# Patient Record
Sex: Male | Born: 1935 | Race: White | Hispanic: No | Smoking: Former smoker
Health system: Southern US, Community
[De-identification: ages and names within clinical notes are randomized; demographics above are authoritative.]

## PROBLEM LIST (undated history)

## (undated) DIAGNOSIS — H9190 Unspecified hearing loss, unspecified ear: Secondary | ICD-10-CM

## (undated) DIAGNOSIS — Z87891 Personal history of nicotine dependence: Secondary | ICD-10-CM

## (undated) DIAGNOSIS — R0600 Dyspnea, unspecified: Secondary | ICD-10-CM

## (undated) DIAGNOSIS — M48062 Spinal stenosis, lumbar region with neurogenic claudication: Secondary | ICD-10-CM

## (undated) DIAGNOSIS — N400 Enlarged prostate without lower urinary tract symptoms: Secondary | ICD-10-CM

## (undated) DIAGNOSIS — R0609 Other forms of dyspnea: Secondary | ICD-10-CM

## (undated) DIAGNOSIS — R3911 Hesitancy of micturition: Secondary | ICD-10-CM

## (undated) DIAGNOSIS — I4891 Unspecified atrial fibrillation: Secondary | ICD-10-CM

## (undated) DIAGNOSIS — R0789 Other chest pain: Secondary | ICD-10-CM

## (undated) DIAGNOSIS — I10 Essential (primary) hypertension: Secondary | ICD-10-CM

## (undated) DIAGNOSIS — K279 Peptic ulcer, site unspecified, unspecified as acute or chronic, without hemorrhage or perforation: Secondary | ICD-10-CM

## (undated) HISTORY — DX: Other forms of dyspnea: R06.09

## (undated) HISTORY — PX: HERNIA REPAIR: SHX51

## (undated) HISTORY — DX: Personal history of nicotine dependence: Z87.891

## (undated) HISTORY — DX: Unspecified atrial fibrillation: I48.91

## (undated) HISTORY — DX: Essential (primary) hypertension: I10

## (undated) HISTORY — PX: TYMPANOMASTOIDECTOMY: SHX34

## (undated) HISTORY — DX: Dyspnea, unspecified: R06.00

## (undated) HISTORY — DX: Peptic ulcer, site unspecified, unspecified as acute or chronic, without hemorrhage or perforation: K27.9

## (undated) HISTORY — DX: Other chest pain: R07.89

## (undated) HISTORY — DX: Spinal stenosis, lumbar region with neurogenic claudication: M48.062

---

## 1998-03-01 ENCOUNTER — Ambulatory Visit (HOSPITAL_COMMUNITY): Admission: RE | Admit: 1998-03-01 | Discharge: 1998-03-01 | Payer: Self-pay | Admitting: Internal Medicine

## 1998-03-01 ENCOUNTER — Ambulatory Visit (HOSPITAL_COMMUNITY): Admission: RE | Admit: 1998-03-01 | Discharge: 1998-03-01 | Payer: Self-pay | Admitting: Cardiology

## 1999-05-01 ENCOUNTER — Encounter: Payer: Self-pay | Admitting: General Surgery

## 1999-05-02 ENCOUNTER — Ambulatory Visit (HOSPITAL_COMMUNITY): Admission: RE | Admit: 1999-05-02 | Discharge: 1999-05-02 | Payer: Self-pay | Admitting: General Surgery

## 1999-06-25 ENCOUNTER — Ambulatory Visit (HOSPITAL_BASED_OUTPATIENT_CLINIC_OR_DEPARTMENT_OTHER): Admission: RE | Admit: 1999-06-25 | Discharge: 1999-06-25 | Payer: Self-pay | Admitting: *Deleted

## 2001-04-05 ENCOUNTER — Inpatient Hospital Stay (HOSPITAL_COMMUNITY): Admission: EM | Admit: 2001-04-05 | Discharge: 2001-04-06 | Payer: Self-pay | Admitting: Emergency Medicine

## 2001-04-05 ENCOUNTER — Encounter: Payer: Self-pay | Admitting: Emergency Medicine

## 2003-06-05 ENCOUNTER — Ambulatory Visit (HOSPITAL_COMMUNITY): Admission: RE | Admit: 2003-06-05 | Discharge: 2003-06-05 | Payer: Self-pay | Admitting: Orthopaedic Surgery

## 2004-06-28 ENCOUNTER — Emergency Department (HOSPITAL_COMMUNITY): Admission: EM | Admit: 2004-06-28 | Discharge: 2004-06-28 | Payer: Self-pay | Admitting: Emergency Medicine

## 2005-04-02 ENCOUNTER — Emergency Department (HOSPITAL_COMMUNITY): Admission: EM | Admit: 2005-04-02 | Discharge: 2005-04-02 | Payer: Self-pay | Admitting: Emergency Medicine

## 2005-04-02 ENCOUNTER — Ambulatory Visit: Payer: Self-pay | Admitting: Cardiology

## 2005-04-03 ENCOUNTER — Ambulatory Visit: Payer: Self-pay

## 2005-04-14 ENCOUNTER — Ambulatory Visit: Payer: Self-pay | Admitting: Internal Medicine

## 2005-07-07 ENCOUNTER — Ambulatory Visit: Payer: Self-pay | Admitting: Cardiology

## 2005-07-20 ENCOUNTER — Ambulatory Visit: Payer: Self-pay

## 2007-03-15 ENCOUNTER — Emergency Department (HOSPITAL_COMMUNITY): Admission: EM | Admit: 2007-03-15 | Discharge: 2007-03-15 | Payer: Self-pay | Admitting: Emergency Medicine

## 2008-06-25 ENCOUNTER — Encounter: Admission: RE | Admit: 2008-06-25 | Discharge: 2008-06-25 | Payer: Self-pay | Admitting: Family Medicine

## 2008-08-27 ENCOUNTER — Encounter: Payer: Self-pay | Admitting: Cardiology

## 2008-09-10 DIAGNOSIS — I10 Essential (primary) hypertension: Secondary | ICD-10-CM | POA: Insufficient documentation

## 2008-09-11 ENCOUNTER — Ambulatory Visit: Payer: Self-pay | Admitting: Cardiology

## 2008-10-01 ENCOUNTER — Telehealth (INDEPENDENT_AMBULATORY_CARE_PROVIDER_SITE_OTHER): Payer: Self-pay | Admitting: *Deleted

## 2008-10-02 ENCOUNTER — Encounter: Payer: Self-pay | Admitting: Cardiology

## 2008-10-02 ENCOUNTER — Ambulatory Visit: Payer: Self-pay

## 2008-11-13 ENCOUNTER — Ambulatory Visit: Payer: Self-pay | Admitting: Cardiology

## 2010-09-05 NOTE — H&P (Signed)
[place]. [facility]  Patient:    Ralph Mendoza, Ralph Mendoza Visit Number: [id number] MRN: [id number]          Service Type: MED Location: [phone] Attending Physician:  Junious Silk Dictated by:   Madolyn Frieze. Jens Som, M.D. [facility] Admit Date:  04/05/2001                           History and Physical  HISTORY OF PRESENT ILLNESS:  [name] is a 77 year old male with a past medical history of hypertension, peptic ulcer disease, and left ear surgery in March 2001 who presents for presyncope.  I was the patient approximately two years ago secondary to epigastric pain.  At that time he apparently had a stress test which was normal, although I do not have those records available. He was subsequently seen by the gastroenterologist and diagnosed with a peptic ulcer disease.  He was treated appropriately and his symptoms resolved.  Since then he has done well.  He walks three to four miles per day and also swims occasionally.  He has no chest pain with these activities and denies any dyspnea on exertion, orthopnea, PND, pedal edema, palpitations, or syncope. He does occasionally feel "dizzy" but this is nonsustained.  This morning the patient went to swim at [facility] and did not eat prior to leaving. He swam for approximately 55 minutes.  He subsequently felt somewhat queasy and had a bowel movement.  He then took a hot shower and subsequently began to feel diaphoretic with "dizziness."  There were no associated palpitations, chest pain, or syncope.  He denies any loss of strength in his extremities or loss of sensation.  There was no incontinence or dysarthria.  The patient sat down and had some improvement.  However, when he stood again he again felt somewhat dizzy as previously.  EMS was called and he was noted to be somewhat bradycardic and he was brought to the emergency room.  He now is asymptomatic. Cardiology is asked to further evaluate.  For  details of his past medical history, social history, family history, and review of systems, please refer to the note handwritten by Jacolyn Reedy, P.A. and I agree with those documents.  PRESENT MEDICATIONS:  Lotensin/hydrochlorothiazide 20/12.5 mg two q.d.  PHYSICAL EXAMINATION TODAY:  VITAL SIGNS:  Blood pressure 110/58, pulse 48 on arrival.  His respiratory rate is 16.  GENERAL:  He is well-developed and well-nourished in no acute distress.  SKIN:  Warm and dry.  HEENT:  Unremarkable with normal eyelids.  NECK:  Supple with a normal upstroke bilaterally and there are no bruits noted.  There is no jugular venous distention, no thyromegaly noted.  CHEST:  Clear to auscultation with normal expansion.  CARDIOVASCULAR:  Bradycardic rate but a regular rhythm.  I can appreciate no murmurs or rubs.  There is a question of an S4.  ABDOMEN:  Not tender or distended, positive bowel sounds.  No hepatosplenomegaly and no masses appreciated.  There is no abdominal bruit. He has 2+ femoral pulses bilaterally and no bruits.  EXTREMITIES:  Show no edema.  I can palpate no cords.  He has 2+ posterior tibial pulses bilaterally.  NEUROLOGIC:  Grossly intact.  LABORATORY DATA:  His electrocardiogram shows a marked sinus bradycardia at a rate of 48.  The axis is normal.  There are no significant ST changes.  His BUN and creatinine are 22 and 1.2 respectively  with a potassium of 4.8. His hemoglobin and hematocrit are 15 and 44 respectively.  His chest x-ray shows no acute disease.  DIAGNOSES: 1. Presyncope, possibly secondary to vasovagal episode. 2. Hypertension. 3. History of peptic ulcer disease.  PLAN:  [name] presents for evaluation of presyncope.  His symptoms occurred following a bowel movement and a hot shower.  He also had no p.o. intake this morning in anticipation of a physical examination.  It therefore may have been related to a vasovagal episode superimposed on mild  dehydration.  His initial enzymes are negative.  His electrocardiogram shows no acute ischemia.  We will admit and rule out myocardial infarction.  If his enzymes are negative we will plan a stress Cardiolite in the morning both to exclude ischemia - which I doubt - and also to evaluate chronotropic competence.  The patient is also somewhat bradycardic and there may be a component of sinus node dysfunction. However, this is not clear at this point.  We will admit to telemetry and follow his heart rate over the next 24 hours.  If he has events such as these in the future then we may need to proceed with an event monitor to correlate his symptoms with his heart rate.  I will also check a TSH to exclude hypothyroidism and we will hydrate for possible mild dehydration. Dictated by:   Madolyn Frieze. Jens Som, M.D. [facility] Attending Physician:  Junious Silk DD:  04/05/01 TD:  04/05/01 Job: [id number]

## 2010-09-05 NOTE — Op Note (Signed)
[place]. [facility]  Patient:    Ralph Mendoza, Ralph Mendoza                       MRN: [id number] Proc. Date: 06/25/99 Adm. Date:  [id number] Attending:  Aundria Mems                           Operative Report  PREOPERATIVE DIAGNOSIS:  Attic defect, cholesteatoma with conductive hearing loss.  POSTOPERATIVE DIAGNOSIS:  Attic defect, cholesteatoma with conductive hearing loss.  OPERATION PERFORMED:  Left modified radical tympanomastoidectomy with type 3 tympanoplasty.  SURGEON:  Kathy Breach, M.D.  ANESTHESIA:  General orotracheal.  DESCRIPTION OF PROCEDURE:  With the patient under general orotracheal anesthesia, the left ear was prepped and draped in sterile fashion draping the face in such a way that the left side could be completely visualized for facial nerve monitoring. Postauricular incision was made through skin and subcutaneous tissues down through periosteum of the mastoid cortex and superiorly to the temporalis fascia.  Soft  tissues of the ear were turned forward and elevated subperiosteally and temporalis fascia anteriorly identifying the posterior 180 degrees of the bony external canal. Additional periosteal soft tissues were elevated off the lateral mastoid cortex  posteriorly ____________ with exposure of the entire mastoid cortex.  With a rotating bur in a triangular shaped fashion drilling bone away limited by the level of the tegmen mastoideum superiorly, sigmoid sinus region posteroinferiorly and the posterior canal wall anteriorly.  A complete mastoidectomy was done.  The antrum was entered and only thickened reactive mucosa was encountered with no mastoid cholesteatoma present.  Complete mastoidectomy was then done clearing the tegmen mastoideum back to the sinodural angle cleaning the posterior sigmoid sinus, posterior cerebellar plate down to a small sclerotic tip with the entire mastoid cavity being basically quite sclerotic  and thinning the posterior wall.  The lateral attic wall was removed forward entering the attic and identifying the body of incus.  At this point canal skin was elevated down to the posterior 180 degrees canal wall encountering the large scutum defect and identifying the large cholesteatoma attic defect past anterior and superior to the intact head of the  malleus.  With extent of the disease in this area, the only way to eradicate the disease was to be able to remove the malleus head and incus thus necessitating own the canal wall, performing a modified radical mastoidectomy.  The incudostapedial joint was separated and the incus removed.  The tensor tympany tendon was excised with Bellucci scissors and the malleus head and neck were excised with a malleus snippers.  Skin entirely on the lateral surface of the malleus head and extending around anteriorly in the anterior attic cell cholesteatoma finally was removed.  The bone was sugary and granular and immediate approximation was completely cleared away.  The anterior buttress was removed.  The posterior facial ridge was taken  down with facial nerve directly in site.  Careful examination of the facial nerve area revealed it was dehiscent.  The entire horizontal portion from the cochleariform process back over the oval window area.  The stapes superstructure was intact with the stapedius tendon intact.  The facial ridge was lowered to the level of the stapes head and taken down inferiorly enlarging or clearing away the bone over the hypotympanum and making the large bony external canal flush into he inferior aspect of the shallow mastoidectomy cavity  of the sclerotic temporal bone. With this completed, a canalplasty was done making an incision at the junction f the bony cartilaginous canal, the canal skin 180 degrees posterior margin. Lempert 1 and Lempert 2 incisions were then made from externally using the  endaural speculum enlarging the meatus until the index finger readily passed through the  meatus.  Hemostasis around the canalplasty incisions was controlled with light electrocautery.  The posterior skin flap was tacked over the back side of the soft tissue of the ear with two 4-0 chromic catgut sutures.  Turning to the mastoid cavity and middle ear space at this time.  A large previously obtained and pressed temporalis fascia graft was then laid on a bed of Gelfoam soaked in cortisporin  suspension filling the middle ear space.  The anterior two thirds of the tympanic membrane was laid back over the large fascia graft which was tucked beneath it anteriorly and draped over the facial ridge into the lateral, into the medial attic and antrum walls thus reforming an intact tympanic membrane and middle ear space with a type 3 reconstruction with fascia graft draped over the stapes capitulum. This was stabilized in position with Gelfoam packing in the anterior sulcus. Two Vaseline gauze rolls were placed over the fascia as it draped over the facial ridge and the mastoid cavity was then packed with iodoform gauze strip.  The ear was turned back and positioned and the incision line was closed with interrupted 3-0 chromic catgut suture subcutaneously and a running 3-0 chromic catgut suture in the skin.  Sterile cotton ball was placed in the external meatus.  There was no evidence of any facial nerve movement throughout the case with close observation during the portion of the procedure where the facial ridge was lowered nearly to the level of the facial nerve.  Estimated blood loss for this procedure was about 100 cc.  The patient tolerated the procedure well and was taken to the recovery  room in stable general condition. DD:  06/25/99 TD:  06/26/99 Job: [id number]

## 2010-09-05 NOTE — Discharge Summary (Signed)
[place]. [facility]  Patient:    Ralph Mendoza, Ralph Mendoza Visit Number: [id number] MRN: [id number]          Service Type: MED Location: [phone] Attending Physician:  Junious Silk Dictated by:   Delton See, P.A. Admit Date:  04/05/2001 Discharge Date: 04/06/2001   CC:         Desma Maxim, M.D.   Referring Physician Discharge [place]  DATE OF BIRTH:  [birth year]  BRIEF HISTORY:  [name] is a very pleasant, active 77 year old male with no previous cardiac history.  He was admitted to [facility] on April 05, 2001 after he had been swimming at [employer] pool that morning. He took a shower and became very dizzy and had a presyncopal episode.  He was admitted to [facility] for further evaluation.  PAST MEDICAL HISTORY:  Significant for hypertension, history of peptic ulcer disease and H. pylori, history of ear surgery, history of hernia repair.  SOCIAL HISTORY:  The patient lives in [place] with his wife; he is retired. He has a remote tobacco history.  He stays very active with walking and swimming on a daily basis.  HOSPITAL COURSE:  As noted, this patient was admitted to [facility] for further evaluation of presyncope.  He had seen Dr. Jens Som in the past for an exercise test which apparently was negative.  Initially upon admission the patient was noted to be bradycardic with a heart rate of 48.  His blood pressure was 110/58 at time of admission.  It was felt that he was possibly mildly dehydrated.  He had been on Lotensin/HCTZ 20/12.5 mg daily.  He had been scheduled to have a physical on the day of admission and had not eaten after midnight the night before.  Cardiac enzymes were checked; these were found to be negative.  The patient was scheduled for an exercise Cardiolite which was performed on April 06, 2001.  He had some mild dizziness and mild presyncopal symptoms during  the Cardiolite.  He had some PVCs although the Cardiolite was interpreted as normal with an EF of 57%.  Arrangements were made to discharge the patient in improved condition with follow-up with Dr. Jens Som.  Of note, during the patients stay he was noted to have a mildly elevated bilirubin.  This will be checked at his next office visit.  LABORATORY DATA:  An INR on the day of discharge was 1.0, PTT 32.  Troponins and CK-MBs were negative during his stay.  A basic metabolic panel on December 18 revealed BUN 17, creatinine 1.1, glucose 116, total bilirubin 1.8, potassium 4.2, sodium 140.  A CBC revealed hemoglobin 13.4; hematocrit 37.5; wbcs 5.2; platelets 152,000.  TSH 0.544.  Chest x-ray showed no evidence of acute disease.  EKG revealed sinus bradycardia, rate 48 beats per minute, without ischemic changes.  DISCHARGE MEDICATIONS:  The patient was discharged on the same medications he was on at time of admission - Lotensin HCT 20/12.5 one daily.  The hydrochlorothiazide portion of this medication was held during his recent stay.  ACTIVITY:  The patient was told it was okay for him to resume swimming.  DIET:  He was to stay on a low fat/low cholesterol diet/low sodium diet.  FOLLOW-UP:  He was to have a bilirubin blood test at his next office visit. He was to see Dr. Jens Som April 27, 2001 at 9:45 a.m., Dr. Arvilla Market as needed or as scheduled.  PROBLEM  LIST AT TIME OF DISCHARGE: 1. Presyncope. 2. History of peptic ulcer disease. 3. History of hypertension. 4. Bradycardia, which according to the patient has been longstanding. 5. Mildly elevated total bilirubin. 6. Negative exercise Cardiolite performed April 06, 2001. 7. Status post several surgeries. Dictated by:   Delton See, P.A. Attending Physician:  Junious Silk DD:  04/06/01 TD:  04/06/01 Job: [id number] [id number]

## 2010-09-05 NOTE — Consult Note (Signed)
NAME:  [name], [name] NO.:  [id number]   MEDICAL RECORD NO.:  [id number]          PATIENT TYPE:  EMS   LOCATION:  [facility]                         FACILITY:  [facility]   PHYSICIAN:  Ralph Mendoza, M.D.   DATE OF BIRTH:  October 08, 1920   DATE OF CONSULTATION:  04/02/2005  DATE OF DISCHARGE:                                   CONSULTATION   HISTORY OF PRESENT ILLNESS:  We are asked to see [name] in the  emergency room  today for chest pain.  This was described as a fleeting pain  across and under his left breast.  There were no associated symptoms.  It  was very short in duration.  He is currently pain free.  He is 77 years of  age and has a history of coronary artery disease.  He had a negative  functional study in October 2002.  He is very active and enjoys swimming on  a regular basis where he has had no chest discomfort or limitation.   PAST MEDICAL HISTORY:  He has had a history of hypertension, peptic ulcer  disease, history of hiatal hernia.  He has had left ear surgery.   CURRENT MEDICATIONS:  Lotensin HCTZ 20/12.5 daily, Foltx daily.   ALLERGIES:  No known drug allergies.   SOCIAL HISTORY:  He is married and lives with his wife here in [place].  He is retired from [employer].  He has a remote history of  smoking.  He swims daily.  He has two grown daughters.   FAMILY HISTORY:  Noncontributory.   REVIEW OF SYSTEMS:  Other than the above is negative.   PHYSICAL EXAMINATION:  VITAL SIGNS:  Blood pressure 152/66, pulse 57, he is in sinus brady,  temperature 97.8, respiratory rate 20, saturations 99%.  GENERAL:  He is very pleasant in no acute distress.  HEENT:  Unremarkable.  NECK:  Supple, carotid upstrokes equal bilaterally without bruits.  No JVD.  CARDIAC:  Regular rate and rhythm without murmurs, gallops, and rubs.  LUNGS:  Clear to auscultation and percussion.  ABDOMEN:  Soft, good bowel sounds, no tenderness.  EXTREMITIES:  No  cyanosis, clubbing, and edema.  Pulses are present.  NEUROLOGICAL:  Intact.   EKG shows sinus brady with no acute ST segment changes.  Cardiac enzymes are  negative.  Other laboratory data is unremarkable.   ASSESSMENT/PLAN:  Atypical chest pain in a patient with known CAD.  He is to  follow up with Dr. Jens Mendoza and needs a functional study.  We will arrange  this through the office.  He will continue his current medications for the  present time.      Ralph Mendoza, M.D.  Electronically Signed     TCW/MEDQ  D:  04/02/2005  T:  04/03/2005  Job:  [id number]   cc:   Ralph Mendoza, M.D. [facility]  [address]  [address]  [place]  [place] [address]

## 2010-10-21 ENCOUNTER — Encounter: Payer: Self-pay | Admitting: Cardiology

## 2011-01-22 ENCOUNTER — Other Ambulatory Visit: Payer: Self-pay | Admitting: Neurosurgery

## 2011-01-22 DIAGNOSIS — M545 Low back pain, unspecified: Secondary | ICD-10-CM

## 2011-01-29 ENCOUNTER — Ambulatory Visit
Admission: RE | Admit: 2011-01-29 | Discharge: 2011-01-29 | Disposition: A | Discharge status: Home / Self Care | Payer: Medicare Other | Source: Ambulatory Visit | Attending: Neurosurgery | Admitting: Neurosurgery

## 2011-01-29 DIAGNOSIS — M545 Low back pain, unspecified: Secondary | ICD-10-CM

## 2011-02-24 ENCOUNTER — Encounter (HOSPITAL_COMMUNITY): Payer: Self-pay | Admitting: Pharmacy Technician

## 2011-02-25 ENCOUNTER — Other Ambulatory Visit (HOSPITAL_COMMUNITY): Payer: Self-pay | Admitting: Neurosurgery

## 2011-02-25 ENCOUNTER — Encounter (HOSPITAL_COMMUNITY)
Admission: RE | Admit: 2011-02-25 | Discharge: 2011-02-25 | Disposition: A | Discharge status: Home / Self Care | Payer: Medicare Other | Source: Ambulatory Visit | Attending: Neurosurgery | Admitting: Neurosurgery

## 2011-02-25 ENCOUNTER — Ambulatory Visit (HOSPITAL_COMMUNITY)
Admission: RE | Admit: 2011-02-25 | Discharge: 2011-02-25 | Disposition: A | Discharge status: Home / Self Care | Payer: Medicare Other | Source: Ambulatory Visit | Attending: Anesthesiology | Admitting: Anesthesiology

## 2011-02-25 ENCOUNTER — Encounter (HOSPITAL_COMMUNITY): Payer: Self-pay

## 2011-02-25 ENCOUNTER — Other Ambulatory Visit: Payer: Self-pay

## 2011-02-25 DIAGNOSIS — Z01812 Encounter for preprocedural laboratory examination: Secondary | ICD-10-CM | POA: Insufficient documentation

## 2011-02-25 DIAGNOSIS — Z01818 Encounter for other preprocedural examination: Secondary | ICD-10-CM | POA: Insufficient documentation

## 2011-02-25 DIAGNOSIS — Z01810 Encounter for preprocedural cardiovascular examination: Secondary | ICD-10-CM | POA: Insufficient documentation

## 2011-02-25 HISTORY — DX: Benign prostatic hyperplasia without lower urinary tract symptoms: N40.0

## 2011-02-25 HISTORY — DX: Unspecified hearing loss, unspecified ear: H91.90

## 2011-02-25 HISTORY — DX: Hesitancy of micturition: R39.11

## 2011-02-25 LAB — BASIC METABOLIC PANEL
BUN: 31 mg/dL — ABNORMAL HIGH (ref 6–23)
CO2: 30 mEq/L (ref 19–32)
Calcium: 9.9 mg/dL (ref 8.4–10.5)
Glucose, Bld: 94 mg/dL (ref 70–99)
Potassium: 4.6 mEq/L (ref 3.5–5.1)
Sodium: 140 mEq/L (ref 135–145)

## 2011-02-25 LAB — CBC
HCT: 37 % — ABNORMAL LOW (ref 39.0–52.0)
Hemoglobin: 12.6 g/dL — ABNORMAL LOW (ref 13.0–17.0)
MCH: 30.9 pg (ref 26.0–34.0)
MCV: 90.7 fL (ref 78.0–100.0)
RBC: 4.08 MIL/uL — ABNORMAL LOW (ref 4.22–5.81)
WBC: 5.1 10*3/uL (ref 4.0–10.5)

## 2011-02-25 LAB — SURGICAL PCR SCREEN
MRSA, PCR: NEGATIVE
Staphylococcus aureus: NEGATIVE

## 2011-02-25 NOTE — Pre-Procedure Instructions (Signed)
20 Ralph Mendoza  02/25/2011   Your procedure is scheduled on:  November 14  Report to [facility] Stay [facility] at 7:45 AM.  Call this number if you have problems the morning of surgery: [phone]   Remember:   Do not eat food:After Midnight.  Do not drink clear liquids: 4 Hours before arrival.  Take these medicines the morning of surgery with A SIP OF WATER: none    Do not wear jewelry, make-up or nail polish.  Do not wear lotions, powders, or perfumes. You may wear deodorant.  Do not shave 48 hours prior to surgery.  Do not bring valuables to the hospital.  Contacts, dentures or bridgework may not be worn into surgery.  Leave suitcase in the car. After surgery it may be brought to your room.  For patients admitted to the hospital, checkout time is 11:00 AM the day of discharge.   Patients discharged the day of surgery will not be allowed to drive home.  Name and phone number of your driver: NA  Special Instructions: CHG Shower Use Special Wash: 1/2 bottle night before surgery and 1/2 bottle morning of surgery.   Please read over the following fact sheets that you were given: Pain Booklet, Coughing and Deep Breathing and Surgical Site Infection Prevention

## 2011-02-25 NOTE — Progress Notes (Signed)
Stress test and ECHO completed at [facility] in 2010 per pt.

## 2011-03-03 MED ORDER — CEFAZOLIN SODIUM-DEXTROSE 2-3 GM-% IV SOLR
2.0000 g | Freq: Once | INTRAVENOUS | Status: AC
  Administered 2011-03-04: 2 g via INTRAVENOUS

## 2011-03-03 NOTE — Consult Note (Signed)
Anesthesia:  77 year old male for lumbar lami on 03/04/11.  Hx + HTN, PUD, BPH, former smoker.  Saw Dr. Jens Som in 2010 for dyspnea.  Had Myoview 10/02/08 (under Notes in Epic) showing no ischemia with EF 56%.  No further w/u with PRN f/u was recommended.  Asked to review preoperative EKG.  Overall appears stable since 2006, although now with first degree AVB.  No CV symptoms reported at PAT visit.  Labs/CXR noted.  Anesthesiologist will evaluate DOS, but anticipate will be okay to proceed.

## 2011-03-04 ENCOUNTER — Inpatient Hospital Stay (HOSPITAL_COMMUNITY)
Admission: RE | Admit: 2011-03-04 | Discharge: 2011-03-05 | DRG: 491 | Disposition: A | Discharge status: Home / Self Care | Payer: Medicare Other | Source: Ambulatory Visit | Attending: Neurosurgery | Admitting: Neurosurgery

## 2011-03-04 ENCOUNTER — Encounter (HOSPITAL_COMMUNITY)
Admission: RE | Disposition: A | Discharge status: Home / Self Care | Payer: Self-pay | Source: Ambulatory Visit | Attending: Neurosurgery

## 2011-03-04 ENCOUNTER — Ambulatory Visit (HOSPITAL_COMMUNITY): Payer: Medicare Other | Admitting: Vascular Surgery

## 2011-03-04 ENCOUNTER — Ambulatory Visit (HOSPITAL_COMMUNITY): Payer: Medicare Other

## 2011-03-04 ENCOUNTER — Encounter (HOSPITAL_COMMUNITY): Payer: Self-pay | Admitting: Vascular Surgery

## 2011-03-04 ENCOUNTER — Encounter (HOSPITAL_COMMUNITY): Payer: Self-pay | Admitting: *Deleted

## 2011-03-04 DIAGNOSIS — E7800 Pure hypercholesterolemia, unspecified: Secondary | ICD-10-CM | POA: Diagnosis present

## 2011-03-04 DIAGNOSIS — M4120 Other idiopathic scoliosis, site unspecified: Secondary | ICD-10-CM | POA: Diagnosis present

## 2011-03-04 DIAGNOSIS — M5137 Other intervertebral disc degeneration, lumbosacral region: Secondary | ICD-10-CM | POA: Diagnosis present

## 2011-03-04 DIAGNOSIS — Z87891 Personal history of nicotine dependence: Secondary | ICD-10-CM

## 2011-03-04 DIAGNOSIS — J4489 Other specified chronic obstructive pulmonary disease: Secondary | ICD-10-CM | POA: Diagnosis present

## 2011-03-04 DIAGNOSIS — M51379 Other intervertebral disc degeneration, lumbosacral region without mention of lumbar back pain or lower extremity pain: Secondary | ICD-10-CM | POA: Diagnosis present

## 2011-03-04 DIAGNOSIS — R1031 Right lower quadrant pain: Secondary | ICD-10-CM | POA: Diagnosis present

## 2011-03-04 DIAGNOSIS — M48062 Spinal stenosis, lumbar region with neurogenic claudication: Principal | ICD-10-CM | POA: Diagnosis present

## 2011-03-04 DIAGNOSIS — J449 Chronic obstructive pulmonary disease, unspecified: Secondary | ICD-10-CM | POA: Diagnosis present

## 2011-03-04 DIAGNOSIS — I10 Essential (primary) hypertension: Secondary | ICD-10-CM | POA: Diagnosis present

## 2011-03-04 HISTORY — PX: LUMBAR LAMINECTOMY/DECOMPRESSION MICRODISCECTOMY: SHX5026

## 2011-03-04 SURGERY — LUMBAR LAMINECTOMY/DECOMPRESSION MICRODISCECTOMY
Anesthesia: General

## 2011-03-04 MED ORDER — ONDANSETRON HCL 4 MG/2ML IJ SOLN: 4.0000 mg | Freq: Once | INTRAMUSCULAR | Status: DC | PRN

## 2011-03-04 MED ORDER — ACETAMINOPHEN 325 MG PO TABS: 650.0000 mg | ORAL_TABLET | ORAL | Status: DC | PRN

## 2011-03-04 MED ORDER — ROCURONIUM BROMIDE 100 MG/10ML IV SOLN
INTRAVENOUS | Status: DC | PRN
  Administered 2011-03-04: 50 mg via INTRAVENOUS

## 2011-03-04 MED ORDER — LACTATED RINGERS IV SOLN: INTRAVENOUS | Status: DC

## 2011-03-04 MED ORDER — ONDANSETRON HCL 4 MG/2ML IJ SOLN: 4.0000 mg | INTRAMUSCULAR | Status: DC | PRN

## 2011-03-04 MED ORDER — NEOSTIGMINE METHYLSULFATE 1 MG/ML IJ SOLN
INTRAMUSCULAR | Status: DC | PRN
  Administered 2011-03-04: 4 mg via INTRAVENOUS

## 2011-03-04 MED ORDER — ACETAMINOPHEN 650 MG RE SUPP: 650.0000 mg | RECTAL | Status: DC | PRN

## 2011-03-04 MED ORDER — BACITRACIN ZINC 500 UNIT/GM EX OINT
TOPICAL_OINTMENT | CUTANEOUS | Status: DC | PRN
  Administered 2011-03-04: 1 via TOPICAL

## 2011-03-04 MED ORDER — SODIUM CHLORIDE 0.9 % IJ SOLN: 3.0000 mL | INTRAMUSCULAR | Status: DC | PRN

## 2011-03-04 MED ORDER — SODIUM CHLORIDE 0.9 % IR SOLN
Status: DC | PRN
  Administered 2011-03-04: 11:00:00

## 2011-03-04 MED ORDER — CEFAZOLIN SODIUM-DEXTROSE 2-3 GM-% IV SOLR
2.0000 g | Freq: Once | INTRAVENOUS | Status: DC
  Filled 2011-03-04: qty 50

## 2011-03-04 MED ORDER — CEFAZOLIN SODIUM 1-5 GM-% IV SOLN
1.0000 g | Freq: Three times a day (TID) | INTRAVENOUS | Status: AC
  Administered 2011-03-04 (×2): 1 g via INTRAVENOUS
  Filled 2011-03-04 (×2): qty 50

## 2011-03-04 MED ORDER — BENAZEPRIL HCL 20 MG PO TABS
20.0000 mg | ORAL_TABLET | Freq: Every day | ORAL | Status: DC
  Filled 2011-03-04 (×2): qty 1

## 2011-03-04 MED ORDER — PHENOL 1.4 % MT LIQD: 1.0000 | OROMUCOSAL | Status: DC | PRN

## 2011-03-04 MED ORDER — LACTATED RINGERS IV SOLN
INTRAVENOUS | Status: DC | PRN
  Administered 2011-03-04 (×3): via INTRAVENOUS

## 2011-03-04 MED ORDER — PROPOFOL 10 MG/ML IV EMUL
INTRAVENOUS | Status: DC | PRN
  Administered 2011-03-04: 200 mg via INTRAVENOUS

## 2011-03-04 MED ORDER — OXYCODONE-ACETAMINOPHEN 5-325 MG PO TABS: 1.0000 | ORAL_TABLET | ORAL | Status: DC | PRN

## 2011-03-04 MED ORDER — DOCUSATE SODIUM 100 MG PO CAPS
100.0000 mg | ORAL_CAPSULE | Freq: Two times a day (BID) | ORAL | Status: DC
  Administered 2011-03-04: 100 mg via ORAL
  Filled 2011-03-04: qty 1

## 2011-03-04 MED ORDER — THROMBIN 5000 UNITS EX KIT
PACK | CUTANEOUS | Status: DC | PRN
  Administered 2011-03-04 (×2): 5000 [IU] via TOPICAL

## 2011-03-04 MED ORDER — HEMOSTATIC AGENTS (NO CHARGE) OPTIME
TOPICAL | Status: DC | PRN
  Administered 2011-03-04: 1 via TOPICAL

## 2011-03-04 MED ORDER — MORPHINE SULFATE 4 MG/ML IJ SOLN: 1.0000 mg | INTRAMUSCULAR | Status: DC | PRN

## 2011-03-04 MED ORDER — BUPIVACAINE-EPINEPHRINE PF 0.5-1:200000 % IJ SOLN
INTRAMUSCULAR | Status: DC | PRN
  Administered 2011-03-04: 10 mL

## 2011-03-04 MED ORDER — ONDANSETRON HCL 4 MG/2ML IJ SOLN
INTRAMUSCULAR | Status: DC | PRN
  Administered 2011-03-04: 4 mg via INTRAVENOUS

## 2011-03-04 MED ORDER — FENTANYL CITRATE 0.05 MG/ML IJ SOLN
INTRAMUSCULAR | Status: DC | PRN
  Administered 2011-03-04: 150 ug via INTRAVENOUS
  Administered 2011-03-04 (×2): 50 ug via INTRAVENOUS

## 2011-03-04 MED ORDER — BENAZEPRIL-HYDROCHLOROTHIAZIDE 20-12.5 MG PO TABS: 1.0000 | ORAL_TABLET | Freq: Every day | ORAL | Status: DC

## 2011-03-04 MED ORDER — HYDROMORPHONE HCL PF 1 MG/ML IJ SOLN: 0.2500 mg | INTRAMUSCULAR | Status: DC | PRN

## 2011-03-04 MED ORDER — EPHEDRINE SULFATE 50 MG/ML IJ SOLN
INTRAMUSCULAR | Status: DC | PRN
  Administered 2011-03-04 (×2): 10 mg via INTRAVENOUS

## 2011-03-04 MED ORDER — SODIUM CHLORIDE 0.9 % IR SOLN
Status: DC | PRN
  Administered 2011-03-04: 1000 mL

## 2011-03-04 MED ORDER — SODIUM CHLORIDE 0.9 % IJ SOLN
3.0000 mL | Freq: Two times a day (BID) | INTRAMUSCULAR | Status: DC
  Administered 2011-03-04: 3 mL via INTRAVENOUS

## 2011-03-04 MED ORDER — HYDROCHLOROTHIAZIDE 12.5 MG PO CAPS
12.5000 mg | ORAL_CAPSULE | Freq: Every day | ORAL | Status: DC
  Filled 2011-03-04 (×2): qty 1

## 2011-03-04 MED ORDER — ZOLPIDEM TARTRATE 5 MG PO TABS: 5.0000 mg | ORAL_TABLET | Freq: Every evening | ORAL | Status: DC | PRN

## 2011-03-04 MED ORDER — GLYCOPYRROLATE 0.2 MG/ML IJ SOLN
INTRAMUSCULAR | Status: DC | PRN
  Administered 2011-03-04: .6 mg via INTRAVENOUS
  Administered 2011-03-04: 0.2 mg via INTRAVENOUS

## 2011-03-04 MED ORDER — HYDROCHLOROTHIAZIDE 25 MG PO TABS
12.5000 mg | ORAL_TABLET | Freq: Every day | ORAL | Status: DC
  Filled 2011-03-04: qty 0.5

## 2011-03-04 MED ORDER — MENTHOL 3 MG MT LOZG
1.0000 | LOZENGE | OROMUCOSAL | Status: DC | PRN
  Administered 2011-03-04: 3 mg via ORAL
  Filled 2011-03-04: qty 9

## 2011-03-04 SURGICAL SUPPLY — 54 items
APL SKNCLS STERI-STRIP NONHPOA (GAUZE/BANDAGES/DRESSINGS) ×1
BAG DECANTER FOR FLEXI CONT (MISCELLANEOUS) ×2 IMPLANT
BENZOIN TINCTURE PRP APPL 2/3 (GAUZE/BANDAGES/DRESSINGS) ×2 IMPLANT
BLADE SURG ROTATE 9660 (MISCELLANEOUS) IMPLANT
BRUSH SCRUB EZ PLAIN DRY (MISCELLANEOUS) ×2 IMPLANT
BUR ACORN 6.0 (BURR) ×2 IMPLANT
BUR MATCHSTICK NEURO 3.0 LAGG (BURR) ×2 IMPLANT
CANISTER SUCTION 2500CC (MISCELLANEOUS) ×2 IMPLANT
CLOSURE STERI STRIP 1/2 X4 (GAUZE/BANDAGES/DRESSINGS) ×1 IMPLANT
CLOTH BEACON ORANGE TIMEOUT ST (SAFETY) ×2 IMPLANT
CONT SPEC 4OZ CLIKSEAL STRL BL (MISCELLANEOUS) ×2 IMPLANT
DRAPE LAPAROTOMY 100X72X124 (DRAPES) ×2 IMPLANT
DRAPE MICROSCOPE LEICA (MISCELLANEOUS) ×2 IMPLANT
DRAPE POUCH INSTRU U-SHP 10X18 (DRAPES) ×2 IMPLANT
DRAPE SURG 17X23 STRL (DRAPES) ×8 IMPLANT
ELECT BLADE 4.0 EZ CLEAN MEGAD (MISCELLANEOUS) ×2
ELECT REM PT RETURN 9FT ADLT (ELECTROSURGICAL) ×2
ELECTRODE BLDE 4.0 EZ CLN MEGD (MISCELLANEOUS) ×1 IMPLANT
ELECTRODE REM PT RTRN 9FT ADLT (ELECTROSURGICAL) ×1 IMPLANT
GAUZE SPONGE 4X4 16PLY XRAY LF (GAUZE/BANDAGES/DRESSINGS) ×1 IMPLANT
GLOVE BIO SURGEON STRL SZ8.5 (GLOVE) ×2 IMPLANT
GLOVE ECLIPSE 7.5 STRL STRAW (GLOVE) ×2 IMPLANT
GLOVE EXAM NITRILE LRG STRL (GLOVE) IMPLANT
GLOVE EXAM NITRILE MD LF STRL (GLOVE) ×1 IMPLANT
GLOVE EXAM NITRILE XL STR (GLOVE) IMPLANT
GLOVE EXAM NITRILE XS STR PU (GLOVE) IMPLANT
GLOVE INDICATOR 8.0 STRL GRN (GLOVE) ×1 IMPLANT
GLOVE SS BIOGEL STRL SZ 8 (GLOVE) ×1 IMPLANT
GLOVE SUPERSENSE BIOGEL SZ 8 (GLOVE) ×1
GOWN BRE IMP SLV AUR LG STRL (GOWN DISPOSABLE) ×1 IMPLANT
GOWN BRE IMP SLV AUR XL STRL (GOWN DISPOSABLE) ×2 IMPLANT
GOWN STRL REIN 2XL LVL4 (GOWN DISPOSABLE) ×1 IMPLANT
KIT BASIN OR (CUSTOM PROCEDURE TRAY) ×2 IMPLANT
KIT ROOM TURNOVER OR (KITS) ×2 IMPLANT
NDL HYPO 21X1.5 SAFETY (NEEDLE) IMPLANT
NEEDLE HYPO 21X1.5 SAFETY (NEEDLE) IMPLANT
NEEDLE HYPO 22GX1.5 SAFETY (NEEDLE) ×2 IMPLANT
NS IRRIG 1000ML POUR BTL (IV SOLUTION) ×2 IMPLANT
PACK LAMINECTOMY NEURO (CUSTOM PROCEDURE TRAY) ×2 IMPLANT
PAD ARMBOARD 7.5X6 YLW CONV (MISCELLANEOUS) ×6 IMPLANT
PATTIES SURGICAL .5 X1 (DISPOSABLE) ×1 IMPLANT
RUBBERBAND STERILE (MISCELLANEOUS) ×4 IMPLANT
SPONGE GAUZE 4X4 12PLY (GAUZE/BANDAGES/DRESSINGS) ×2 IMPLANT
SPONGE SURGIFOAM ABS GEL SZ50 (HEMOSTASIS) ×2 IMPLANT
STRIP CLOSURE SKIN 1/2X4 (GAUZE/BANDAGES/DRESSINGS) ×2 IMPLANT
SUT VIC AB 1 CT1 18XBRD ANBCTR (SUTURE) ×2 IMPLANT
SUT VIC AB 1 CT1 8-18 (SUTURE) ×2
SUT VIC AB 2-0 CP2 18 (SUTURE) ×3 IMPLANT
SYR 20CC LL (SYRINGE) IMPLANT
SYR 20ML ECCENTRIC (SYRINGE) ×2 IMPLANT
TAPE CLOTH SURG 4X10 WHT LF (GAUZE/BANDAGES/DRESSINGS) ×1 IMPLANT
TOWEL OR 17X24 6PK STRL BLUE (TOWEL DISPOSABLE) ×3 IMPLANT
TOWEL OR 17X26 10 PK STRL BLUE (TOWEL DISPOSABLE) ×2 IMPLANT
WATER STERILE IRR 1000ML POUR (IV SOLUTION) ×2 IMPLANT

## 2011-03-04 NOTE — Transfer of Care (Signed)
Immediate Anesthesia Transfer of Care Note  Patient: Ralph Mendoza  Procedure(s) Performed:  LUMBAR LAMINECTOMY/DECOMPRESSION MICRODISCECTOMY - LUMBAR THREE-FOUR ,FOUR-FIVE LAMINECTOMIES  Patient Location: PACU  Anesthesia Type: General  Level of Consciousness: alert  and patient cooperative  Airway & Oxygen Therapy: Patient Spontanous Breathing and Patient connected to nasal cannula oxygen  Post-op Assessment: Report given to PACU RN and Post -op Vital signs reviewed and stable  Post vital signs: Reviewed  Complications: No apparent anesthesia complications

## 2011-03-04 NOTE — Progress Notes (Signed)
Subjective:  The patient is alert, pleasant, and looks great. He has no complaints.  Objective: Vital signs in last 24 hours: Temp:  [97.3 F (36.3 C)-97.7 F (36.5 C)] 97.3 F (36.3 C) (11/14 1340) Pulse Rate:  [52-76] 55  (11/14 1340) Resp:  [10-28] 18  (11/14 1340) BP: (131-168)/(56-65) 131/65 mmHg (11/14 1340) SpO2:  [97 %-100 %] 97 % (11/14 1340)  Intake/Output from previous day:   Intake/Output this shift: Total I/O In: 1740 [P.O.:240; I.V.:1500] Out: 100 [Blood:100]  Physical exam the patient is alert and oriented x3. His motor strength is grossly normal his bilateral lower extremities.  Lab Results: No results found for this basename: WBC:2,HGB:2,HCT:2,PLT:2 in the last 72 hours BMET No results found for this basename: NA:2,K:2,CL:2,CO2:2,GLUCOSE:2,BUN:2,CREATININE:2,CALCIUM:2 in the last 72 hours  Studies/Results: Dg Lumbar Spine 1 View  03/04/2011  *RADIOLOGY REPORT*  Clinical Data: L3-5 laminectomies.  LUMBAR SPINE - 1 VIEW  Comparison: Lumbar spine MR 01/29/2011.  Findings: A single intraoperative cross-table lateral view of the lumbar spine, taken at 1100 hours, is submitted.  Image quality is degraded by motion.  Surgical instrument tip projects in the soft tissues between the L4 and L5 spinous processes.  Multilevel degenerative disc disease is noted.  Facet hypertrophy at L4-5 and L5 S1.  IMPRESSION: Intraoperative localization.  Original Report Authenticated By: Reyes Ivan, M.D.    Assessment/Plan: The patient is doing very well. I will tentatively plan to discharge him tomorrow.  LOS: 0 days     Jyquan Kenley D 03/04/2011, 3:33 PM

## 2011-03-04 NOTE — Anesthesia Postprocedure Evaluation (Signed)
  Anesthesia Post-op Note  Patient: Ralph Mendoza  Procedure(s) Performed:  LUMBAR LAMINECTOMY/DECOMPRESSION MICRODISCECTOMY - LUMBAR THREE-FOUR ,FOUR-FIVE LAMINECTOMIES  Patient Location: PACU  Anesthesia Type: General  Level of Consciousness: awake, alert , oriented and patient cooperative  Airway and Oxygen Therapy: Patient Spontanous Breathing and Patient connected to nasal cannula oxygen  Post-op Pain: mild  Post-op Assessment: Post-op Vital signs reviewed, Patient's Cardiovascular Status Stable, Respiratory Function Stable, Patent Airway, No signs of Nausea or vomiting and Pain level controlled  Post-op Vital Signs: stable  Complications: No apparent anesthesia complications

## 2011-03-04 NOTE — Anesthesia Preprocedure Evaluation (Addendum)
Anesthesia Evaluation  Patient identified by MRN, date of birth, ID band Patient awake    Reviewed: Allergy & Precautions, H&P , NPO status , Patient's Chart, lab work & pertinent test results  Airway Mallampati: I TM Distance: >3 FB Neck ROM: full    Dental  (+) Teeth Intact and Dental Advisory Given   Pulmonary shortness of breath, COPD   Pulmonary exam normal   + rales    Cardiovascular Exercise Tolerance: Good hypertension, regular Normal    Neuro/Psych Negative Neurological ROS     GI/Hepatic Neg liver ROS, PUD,   Endo/Other  Negative Endocrine ROS  Renal/GU negative Renal ROS     Musculoskeletal   Abdominal   Peds  Hematology negative hematology ROS (+)   Anesthesia Other Findings   Reproductive/Obstetrics                         Anesthesia Physical Anesthesia Plan  ASA: II  Anesthesia Plan: General   Post-op Pain Management:    Induction: Intravenous  Airway Management Planned: Oral ETT  Additional Equipment:   Intra-op Plan:   Post-operative Plan: Extubation in OR  Informed Consent: I have reviewed the patients History and Physical, chart, labs and discussed the procedure including the risks, benefits and alternatives for the proposed anesthesia with the patient or authorized representative who has indicated his/her understanding and acceptance.     Plan Discussed with: Anesthesiologist, CRNA and Surgeon  Anesthesia Plan Comments:         Anesthesia Quick Evaluation

## 2011-03-04 NOTE — Anesthesia Procedure Notes (Signed)
Procedure Name: Intubation Date/Time: 03/04/2011 10:34 AM Performed by: Everlene Balls TODD Pre-anesthesia Checklist: Patient identified, Emergency Drugs available, Suction available and Patient being monitored Patient Re-evaluated:Patient Re-evaluated prior to inductionOxygen Delivery Method: Circle System Utilized Preoxygenation: Pre-oxygenation with 100% oxygen Intubation Type: IV induction Ventilation: Mask ventilation without difficulty Laryngoscope Size: Mac and 4 Grade View: Grade III Tube type: Oral Tube size: 7.5 mm Number of attempts: 1 Airway Equipment and Method: stylet Placement Confirmation: positive ETCO2,  ETT inserted through vocal cords under direct vision and breath sounds checked- equal and bilateral Secured at: 22 cm Tube secured with: Tape Dental Injury: Teeth and Oropharynx as per pre-operative assessment

## 2011-03-04 NOTE — Op Note (Signed)
Brief history: The patient is a 77 year old white male has suffered from back buttocks and leg pain consistent with neurogenic claudication. He has failed medical management. He was worked up with a lumbar MRI which demonstrated severe spinal stenosis at L4-5 and to a lesser extent L3-4. I discussed the various treatment options with the patient including surgery. I explain the procedure the risk, benefits, alternatives, and expected outcome. I answered all the patient's questions. The patient decided to proceed with a decompressive lumbar laminectomy.  Preoperative diagnosis: L3-4 and L4-5 spinal stenosis, scoliosis, disc degeneration, lumbar radiculopathy/neurogenic claudication, lumbago  Postoperative diagnosis: The same  Procedure: L4 laminectomy with bilateral L3 laminotomies to decompress the bilateral L3, L4, and L5 nerve rootsusing micro-dissection.  Surgeon: Dr. Delma Officer  Asst.: Dr. Orbie Hurst  Anesthesia: Gen. endotracheal  Estimated blood loss: 75 cc  Drains: None  Complications: None  Description of procedure: The patient was brought to the operating room by the anesthesia team. General endotracheal anesthesia was induced. The patient was turned to the prone position on the Wilson frame. The patient's lumbosacral region was then prepared with Betadine scrub and Betadine solution. Sterile drapes were applied.  I then injected the area to be incised with Marcaine with epinephrine solution. I then used a scalpel to make a linear midline incision over the L3-4 and L4-5 intervertebral disc spaces. I then used electrocautery to perform a bilateral  subperiosteal dissection exposing the spinous process and lamina of L3-4 and 5. We obtained intraoidedperative radiograph to confirm our location. I then inserted the [facility] retractor for exposure.  We then brought the operative microscope into the field. Under its magnification and illumination we completed the microdissection. I  used a high-speed drill to perform a laminotomy at L3 and L4 bilaterally. I then used a Kerrison punches to widen the laminotomy and removed the ligamentum flavum at L3-4 and L4-5. We also used the Kerrison punches to complete the laminectomy L4 and removed the cephalad aspect of the L5 lamina.. We then used microdissection to free up the thecal sac and the nerve roots from the epidural tissue. I then used a Kerrison punch to perform a foraminotomy at about the lateral L3, L4, and L5  nerve roots.  I then palpated along the ventral surface of the thecal sac and along exit route of the bilateral L3, L4 and L5 nerve roots and noted that the neural structures were well decompressed. This completed the decompression.  We then obtained hemostasis using bipolar electrocautery. We irrigated the wound out with bacitracin solution. We then removed the retractor. We then reapproximated the patient's thoracolumbar fascia with interrupted #1 Vicryl suture. We then reapproximated the patient's subcutaneous tissue with interrupted 3-0 Vicryl suture. We then reapproximated patient's skin with Steri-Strips and benzoin. The was then coated with bacitracin ointment. The drapes were removed. The patient was subsequently returned to the supine position where they were extubated by the anesthesia team. The patient was then transported to the postanesthesia care unit in stable condition. All sponge instrument and needle counts were correct at the end of this case.

## 2011-03-04 NOTE — H&P (Signed)
Subjective: The patient is an 77 year old white male who complains of back buttocks and leg pain for many years. He has failed medical management. He was worked up with a lumbar MRI demonstrated severe spinal stenosis at L4-5 and to a lesser extent at L3-4. I discussed the various treatment options with the patient and his decided proceed with surgery.  Past Medical History  Diagnosis Date  . Hypertension   . PUD (peptic ulcer disease)     Remote, history of  . BPH (benign prostatic hyperplasia)   . Urinary hesitancy   . Normal cardiac stress test 2008    at [facility] with Dr. Jens Som  . Normal echocardiogram 2008    [place]   . Hearing difficulty     R sided hole in eardrum, only wears L hearing aid  . Hearing loss in right ear     Past Surgical History  Procedure Date  . Tympanomastoidectomy     Left modified radical with type 3 tympanoplasty  . Hernia repair     Left    No Known Allergies  History  Substance Use Topics  . Smoking status: Former Games developer  . Smokeless tobacco: Not on file   Comment: Remote history of smoking  . Alcohol Use: No    Family History  Problem Relation Age of Onset  . Lung cancer Mother   . Appendicitis Father   . Diabetes Sister     DM  . Coronary artery disease Neg Hx     No premature CAD   Prior to Admission medications   Medication Sig Start Date End Date Taking? Authorizing Provider  benazepril-hydrochlorthiazide (LOTENSIN HCT) 20-12.5 MG per tablet Take 1 tablet by mouth daily.     Yes Historical Provider, MD     Review of Systems  Positive ROS: Negative except as above.  All other systems have been reviewed and were otherwise negative with the exception of those mentioned in the HPI and as above.  Objective: Vital signs in last 24 hours: Temp:  [97.7 F (36.5 C)] 97.7 F (36.5 C) (11/14 0812) Pulse Rate:  [57] 57  (11/14 0812) Resp:  [18] 18  (11/14 0812) BP: (168)/(63) 168/63 mmHg (11/14 0812) SpO2:  [99 %] 99 % (11/14  0812) Weight:  [83.915 kg (185 lb)] 185 lb (83.915 kg) (11/13 1500)  General Appearance: Alert, cooperative, no distress, appears stated age Head: Normocephalic, without obvious abnormality, atraumatic Eyes: PERRL, conjunctiva/corneas clear, EOM's intact, fundi benign, both eyes      Ears: Normal TM's and external ear canals, both ears Throat: Lips, mucosa, and tongue normal; teeth and gums normal Neck: Supple, symmetrical, trachea midline, no adenopathy; thyroid: No enlargement/tenderness/nodules; no carotid bruit or JVD Back: Symmetric, no curvature, ROM normal, no CVA tenderness Lungs: Clear to auscultation bilaterally, respirations unlabored Heart: Regular rate and rhythm, S1 and S2 normal, no murmur, rub or gallop Abdomen: Soft, non-tender, bowel sounds active all four quadrants, no masses, no organomegaly Extremities: Extremities normal, atraumatic, no cyanosis or edema Pulses: 2+ and symmetric all extremities Skin: Skin color, texture, turgor normal, no rashes or lesions  NEUROLOGIC:   Mental status: alert and oriented, no aphasia, good attention span, Fund of knowledge/ memory ok Motor Exam - grossly normal Sensory Exam - grossly normal Reflexes: 1/4 Coordination - grossly normal Gait - grossly normal Balance - grossly normal Cranial Nerves: I: s of his medicines does review of systems is as a negative except as above everything is normal he is going change was  not normal and saymell Not tested  II: visual acuity  OS: Normal    OD: Normal   II: visual fields Full to confrontation  II: pupils Equal, round, reactive to light  III,VII: ptosis None  III,IV,VI: extraocular muscles  Full ROM  V: mastication Normal  V: facial light touch sensation  Normal  V,VII: corneal reflex  Present  VII: facial muscle function - upper  Normal  VII: facial muscle function - lower Normal  VIII: hearing Not tested  IX: soft palate elevation  Normal  IX,X: gag reflex Present  XI: trapezius  strength  5/5  XI: sternocleidomastoid strength 5/5  XI: neck flexion strength  5/5  XII: tongue strength  Normal    Data  Lab Results  Component Value Date   WBC 5.1 02/25/2011   HGB 12.6* 02/25/2011   HCT 37.0* 02/25/2011   MCV 90.7 02/25/2011   PLT 159 02/25/2011   Lab Results  Component Value Date   NA 140 02/25/2011   K 4.6 02/25/2011   CL 102 02/25/2011   CO2 30 02/25/2011   BUN 31* 02/25/2011   CREATININE 1.40* 02/25/2011   GLUCOSE 94 02/25/2011   No results found for this basename: INR, PROTIME    Assessment/Plan: 36, L4-5, L5-S1 spinal stenosis, lumbago, lumbar radiculopathy, neurogenic claudication. I have discussed situation with the patient and his family. I have reviewed his MR scan with him. We have discussed the various treatment options including surgery. I have explained surgical option of an L3 and L4 laminectomy. I discussed the risks benefits alternatives and expected outcomes of surgery. I have answered all his questions. Patient wants to proceed with surgery.    Deeana Atwater D 03/04/2011 10:07 AM

## 2011-03-04 NOTE — Preoperative (Signed)
Beta Blockers   Reason not to administer Beta Blockers:Not Applicable 

## 2011-03-05 ENCOUNTER — Encounter (HOSPITAL_COMMUNITY): Payer: Self-pay | Admitting: Neurosurgery

## 2011-03-05 MED ORDER — HYDROCODONE-ACETAMINOPHEN 5-325 MG PO TABS: 1.0000 | ORAL_TABLET | Freq: Four times a day (QID) | ORAL | Status: AC | PRN

## 2011-03-05 MED ORDER — DSS 100 MG PO CAPS: 100.0000 mg | ORAL_CAPSULE | Freq: Two times a day (BID) | ORAL | Status: AC

## 2011-03-05 NOTE — Progress Notes (Signed)
Occupational Therapy Evaluation Patient Details Name: Ralph Mendoza MRN: [id number] DOB: 12-25-1920 Today's Date: 03/05/2011 8:40-9:25  Problem List:  Patient Active Problem List  Diagnoses  . HYPERCHOLESTEROLEMIA-PURE  . HYPERTENSION  . DYSPNEA  . ABDOMINAL PAIN RIGHT LOWER QUADRANT  . Lumbar stenosis with neurogenic claudication    Past Medical History:  Past Medical History  Diagnosis Date  . Hypertension   . PUD (peptic ulcer disease)     Remote, history of  . BPH (benign prostatic hyperplasia)   . Urinary hesitancy   . Normal cardiac stress test 2008    at [facility] with Dr. Jens Som  . Normal echocardiogram 2008    [place]   . Hearing difficulty     R sided hole in eardrum, only wears L hearing aid  . Hearing loss in right ear    Past Surgical History:  Past Surgical History  Procedure Date  . Tympanomastoidectomy     Left modified radical with type 3 tympanoplasty  . Hernia repair     Left    OT Assessment/Plan/Recommendation OT Assessment Clinical Impression Statement: This 77 yo male presents s/p L4 laminectomy with bilateral L3 laminotomies to decompress the bilateral L3, L4, and L5 nerve rootsusing micro-dissection thus affecting pt's PLOF of I with BADLs. All education completed, no further OT needs, will sign off. OT Recommendation/Assessment: Patient does not need any further OT services OT Goals    OT Evaluation Precautions/Restrictions  Precautions Precautions: Back Precaution Booklet Issued: Yes (comment) (therapy post op back sheet) Required Braces or Orthoses: No Restrictions Weight Bearing Restrictions: No Prior Functioning Home Living Lives With: Spouse;Family Type of Home: House Home Layout: One level Home Access: Stairs to enter Entrance Stairs-Rails: Right;Left;Can reach both Entrance Stairs-Number of Steps: 2 Bathroom Shower/Tub: Walk-in shower;Door [employer] Toilet: Handicapped height Bathroom Accessibility: Yes How  Accessible: Accessible via walker Home Adaptive Equipment: None Prior Function Level of Independence: Independent with basic ADLs;Independent with homemaking with ambulation;Independent with gait;Independent with transfers Driving: Yes Vocation: Retired ADL ADL Eating/Feeding: Simulated;Independent Where Assessed - Eating/Feeding: Edge of bed Grooming: Simulated;Independent Where Assessed - Grooming: Standing at sink Upper Body Bathing: Simulated;Independent Where Assessed - Upper Body Bathing: Sitting, bed;Unsupported Lower Body Bathing: Simulated;Moderate assistance Lower Body Bathing Details (indicate cue type and reason): Pt cannot get to his lower legs or feet without bending (not supposed to do), cannot cross one leg over the other, states wife can assist him prn Where Assessed - Lower Body Bathing: Sitting, bed;Unsupported;Sit to stand from bed Upper Body Dressing: Simulated;Independent Where Assessed - Upper Body Dressing: Sitting, bed;Unsupported Lower Body Dressing: Simulated;Moderate assistance Lower Body Dressing Details (indicate cue type and reason): Pt cannot get to his lower legs or feet without bending (not supposed to do), cannot cross one leg over the other, states wife can assist him prn Where Assessed - Lower Body Dressing: Sit to stand from bed;Unsupported;Sitting, bed Toilet Transfer: Simulated;Modified independent Toilet Transfer Method: Proofreader: Comfort height toilet;Other (comment) (vanity beside) Toileting - Clothing Manipulation: Independent;Other (comment) (gown and robe) Where Assessed - Toileting Clothing Manipulation: Standing Toileting - Hygiene: Simulated;Independent Where Assessed - Toileting Hygiene: Sit to stand from 3-in-1 or toilet Tub/Shower Transfer: Not assessed Tub/Shower Transfer Method: Not assessed Vision/Perception  Vision - History Baseline Vision: Wears glasses only for reading Patient Visual Report: No  change from baseline Cognition Cognition Arousal/Alertness: Awake/alert Overall Cognitive Status: Appears within functional limits for tasks assessed Orientation Level: Oriented X4 Sensation/Coordination   Extremity Assessment RUE Assessment RUE Assessment:  Within Functional Limits LUE Assessment LUE Assessment: Within Functional Limits Mobility  Bed Mobility Bed Mobility: Yes Rolling Right: 5: Supervision Rolling Right Details (indicate cue type and reason): verbal cues for sequence Right Sidelying to Sit: 5: Supervision Right Sidelying to Sit Details (indicate cue type and reason): verbal cues for sequence Sitting - Scoot to Edge of Bed: 7: Independent Sit to Supine - Right: 5: Supervision Sit to Supine - Right Details (indicate cue type and reason): verbal cues for sequence Transfers Transfers: Yes Sit to Stand: 7: Independent;From bed;With upper extremity assist Stand to Sit: 7: Independent;With upper extremity assist;To bed Exercises   End of Session OT - End of Session Activity Tolerance: Treatment limited secondary to medical complications (Comment) (Got back to room, pt c/o feeling dizzy; see vitals section) Patient left: in bed;with call bell in reach Nurse Communication: Mobility status for ambulation;Other (comment) (vitals) General Behavior During Session: [facility] for tasks performed Cognition: [facility] for tasks performed   Evette Georges [phone] 03/05/2011, 9:40 AM

## 2011-03-05 NOTE — Plan of Care (Signed)
Problem: Phase I Progression Outcomes Goal: Log roll for position change Outcome: Completed/Met Date Met:  03/05/11 Pt educated during OT session.

## 2011-03-05 NOTE — Plan of Care (Signed)
Problem: Phase I Progression Outcomes Goal: OOB as tolerated unless otherwise ordered Outcome: Completed/Met Date Met:  03/05/11 Pt OOB with OT.

## 2011-03-05 NOTE — Progress Notes (Signed)
Physical Therapy Evaluation Patient Details Name: Ralph Mendoza MRN: [id number] DOB: 07/15/1920 Today's Date: 03/05/2011  Problem List:  Patient Active Problem List  Diagnoses  . HYPERCHOLESTEROLEMIA-PURE  . HYPERTENSION  . DYSPNEA  . ABDOMINAL PAIN RIGHT LOWER QUADRANT  . Lumbar stenosis with neurogenic claudication    Past Medical History:  Past Medical History  Diagnosis Date  . Hypertension   . PUD (peptic ulcer disease)     Remote, history of  . BPH (benign prostatic hyperplasia)   . Urinary hesitancy   . Normal cardiac stress test 2008    at [facility] with Dr. Jens Som  . Normal echocardiogram 2008    [place]   . Hearing difficulty     R sided hole in eardrum, only wears L hearing aid  . Hearing loss in right ear    Past Surgical History:  Past Surgical History  Procedure Date  . Tympanomastoidectomy     Left modified radical with type 3 tympanoplasty  . Hernia repair     Left    PT Assessment/Plan/Recommendation PT Assessment Clinical Impression Statement: Pt is a 77 y/o male admitted for laminectomy and the below listed problem list.  Pt is modified independent and does not require any f/u PT needs acutely or at discharge. PT Recommendation/Assessment: Patent does not need any further PT services No Skilled PT: All education completed;Patient at baseline level of functioning;Patient will have necessary level of assist by caregiver at discharge;Patient is modified independent with all activity/mobility;Other (comment) (Requires min assist for stairs only.) PT Goals  Acute Rehab PT Goals PT Goal Formulation:  (No goals needed.  Pt being discharged from PT.) Time For Goal Achievement: Other (comment) (None needed.)  PT Evaluation Precautions/Restrictions  Precautions Precautions: Back Precaution Booklet Issued: Yes (comment) (Back handout given to patient and reviewed.) Required Braces or Orthoses: No Restrictions Weight Bearing Restrictions: No Prior  Functioning  Home Living Lives With: Spouse;Family Receives Help From: Family;Other (Comment) (PRN) Type of Home: House Home Layout: One level Home Access: Stairs to enter Entrance Stairs-Rails: None (Per patient) Entrance Stairs-Number of Steps: 2 Bathroom Shower/Tub: Walk-in shower;Door [employer] Toilet: Handicapped height Bathroom Accessibility: Yes How Accessible: Accessible via walker Home Adaptive Equipment: None Prior Function Level of Independence: Independent with basic ADLs;Independent with homemaking with ambulation;Independent with gait;Independent with transfers Able to Take Stairs?: Reciprically Driving: Yes Vocation: Retired Financial risk analyst Arousal/Alertness: Awake/alert Overall Cognitive Status: Appears within functional limits for tasks assessed Orientation Level: Oriented X4 Sensation/Coordination Sensation Light Touch: Appears Intact Stereognosis: Not tested Hot/Cold: Not tested Proprioception: Not tested Coordination Gross Motor Movements are Fluid and Coordinated: Yes Fine Motor Movements are Fluid and Coordinated: Not tested Extremity Assessment RUE Assessment RUE Assessment: Not tested LUE Assessment LUE Assessment: Not tested RLE Assessment RLE Assessment: Within Functional Limits LLE Assessment LLE Assessment: Within Functional Limits Pain Pt complains of 1/10 pain in back with evaluation.  Repositioned pt after treatment. Mobility (including Balance) Bed Mobility Bed Mobility: Yes Rolling Right: 6: Modified independent (Device/Increase time) Rolling Right Details (indicate cue type and reason): verbal cues for sequence Right Sidelying to Sit: 6: Modified independent (Device/Increase time) Right Sidelying to Sit Details (indicate cue type and reason): verbal cues for sequence Sitting - Scoot to Edge of Bed: 7: Independent Sit to Supine - Right: Not Tested (comment) (Left sitting EOB.) Sit to Supine - Right Details (indicate cue type and  reason): verbal cues for sequence Transfers Transfers: Yes Sit to Stand: 7: Independent;From bed;With upper extremity assist Stand to Sit:  7: Independent;With upper extremity assist;To bed Ambulation/Gait Ambulation/Gait: Yes Ambulation/Gait Assistance: 6: Modified independent (Device/Increase time) Ambulation Distance (Feet): 200 Feet Assistive device: None Gait Pattern: Trunk flexed;Decreased step length - right;Decreased step length - left Stairs: Yes Stairs Assistance: 4: Min assist Stairs Assistance Details (indicate cue type and reason): Assistance for unilateral HHA for balance secondary to no rail at home entrance. Stair Management Technique: No rails;Other (comment) (Utilized unilateral HHA.) Number of Stairs: 2  Height of Stairs: 8  (8 inches) Wheelchair Mobility Wheelchair Mobility: No  Posture/Postural Control Posture/Postural Control: No significant limitations Balance Balance Assessed: No Exercise    End of Session PT - End of Session Equipment Utilized During Treatment: Gait belt Activity Tolerance: Patient tolerated treatment well Patient left: in bed;Other (comment);with call bell in reach;with family/visitor present (Sitting EOB.) Nurse Communication: Mobility status for transfers;Mobility status for ambulation General Behavior During Session: [facility] for tasks performed Cognition: [facility] for tasks performed  Pt discharged from PT with no f/u needs and no DME needs.  Hazel Leveille M 03/05/2011, 12:06 PM  03/05/2011 Cephus Shelling, PT, DPT [phone]

## 2011-03-17 MED FILL — Sodium Chloride IV Soln 0.9%: INTRAVENOUS | Qty: 500 | Status: AC

## 2011-03-30 NOTE — Discharge Summary (Signed)
Physician Discharge Summary  Patient ID: KEARY WATERSON MRN: [id number] DOB/AGE: 75-May-1922 30 y.o.  Admit date: 03/04/2011 Discharge date: 03/30/2011  Admission Diagnoses:  Discharge Diagnoses:  Principal Problem:  *Lumbar stenosis with neurogenic claudication   Discharged Condition: good  Hospital Course: I admitted the patient to [facility] 03/04/2011. On the day of admission I performed a decompressive lumbar laminectomy. The surgery went well. The patient's postoperative course was unremarkable and he was discharged home on 03/05/2011.  Consults: None Significant Diagnostic Studies: None Treatments: Lumbar laminectomy Discharge Exam: Blood pressure 126/87, pulse 66, temperature 98.4 F (36.9 C), temperature source Oral, resp. rate 16, weight 83.915 kg (185 lb), SpO2 99.00%. The patient is alert and oriented. His strength is grossly normal his lower extremities. His dressing is clean and dry.  Disposition: Home  Discharge Orders    Future Orders Please Complete By Expires   Diet - low sodium heart healthy      Increase activity slowly      Discharge instructions      Comments:   The patient was given oral and written discharge instructions. All his questions were answered.   Call MD for:  temperature >100.4      Call MD for:  persistant nausea and vomiting      Call MD for:  severe uncontrolled pain      Call MD for:  redness, tenderness, or signs of infection (pain, swelling, redness, odor or green/yellow discharge around incision site)      Call MD for:  difficulty breathing, headache or visual disturbances      Call MD for:  hives      Call MD for:  persistant dizziness or light-headedness      Call MD for:  extreme fatigue        Discharge Medication List as of 03/05/2011  9:21 AM    START taking these medications   Details  docusate sodium 100 MG CAPS Take 100 mg by mouth 2 (two) times daily., Starting 03/05/2011, Until Sun 03/15/11, Print      HYDROcodone-acetaminophen (NORCO) 5-325 MG per tablet Take 1 tablet by mouth every 6 (six) hours as needed for pain., Starting 03/05/2011, Until Sun 03/15/11, Print      CONTINUE these medications which have NOT CHANGED   Details  benazepril-hydrochlorthiazide (LOTENSIN HCT) 20-12.5 MG per tablet Take 1 tablet by mouth daily.  , Until Discontinued, Historical Med         Signed: Cristi [name] 03/30/2011, 7:29 PM

## 2011-06-16 DIAGNOSIS — C44621 Squamous cell carcinoma of skin of unspecified upper limb, including shoulder: Secondary | ICD-10-CM | POA: Diagnosis not present

## 2011-06-16 DIAGNOSIS — L570 Actinic keratosis: Secondary | ICD-10-CM | POA: Diagnosis not present

## 2011-06-16 DIAGNOSIS — D235 Other benign neoplasm of skin of trunk: Secondary | ICD-10-CM | POA: Diagnosis not present

## 2011-06-25 DIAGNOSIS — H908 Mixed conductive and sensorineural hearing loss, unspecified: Secondary | ICD-10-CM | POA: Diagnosis not present

## 2011-06-25 DIAGNOSIS — S0920XA Traumatic rupture of unspecified ear drum, initial encounter: Secondary | ICD-10-CM | POA: Diagnosis not present

## 2011-06-25 DIAGNOSIS — H9209 Otalgia, unspecified ear: Secondary | ICD-10-CM | POA: Diagnosis not present

## 2011-06-26 DIAGNOSIS — M48061 Spinal stenosis, lumbar region without neurogenic claudication: Secondary | ICD-10-CM | POA: Diagnosis not present

## 2011-08-11 DIAGNOSIS — E782 Mixed hyperlipidemia: Secondary | ICD-10-CM | POA: Diagnosis not present

## 2011-08-11 DIAGNOSIS — H9190 Unspecified hearing loss, unspecified ear: Secondary | ICD-10-CM | POA: Diagnosis not present

## 2011-08-11 DIAGNOSIS — H7290 Unspecified perforation of tympanic membrane, unspecified ear: Secondary | ICD-10-CM | POA: Diagnosis not present

## 2011-08-11 DIAGNOSIS — I10 Essential (primary) hypertension: Secondary | ICD-10-CM | POA: Diagnosis not present

## 2011-09-18 ENCOUNTER — Telehealth: Payer: Self-pay | Admitting: Cardiology

## 2011-09-18 NOTE — Telephone Encounter (Signed)
Spoke with pt, he wanted to know when his last stress test was done. He has been having a discomfort in his chest that comes and goes. He wanted to see dr Jens Som to get this checked out. Aware dr Jens Som out for 2 weeks, pt scheduled to see pac Monday.

## 2011-09-18 NOTE — Telephone Encounter (Signed)
New msg Pt wants to talk to you about last time he had stress test. Please call

## 2011-09-21 ENCOUNTER — Ambulatory Visit (INDEPENDENT_AMBULATORY_CARE_PROVIDER_SITE_OTHER): Payer: Medicare Other | Admitting: Nurse Practitioner

## 2011-09-21 ENCOUNTER — Encounter: Payer: Self-pay | Admitting: Nurse Practitioner

## 2011-09-21 VITALS — BP 138/68 | HR 61 | Ht 71.0 in | Wt 185.0 lb

## 2011-09-21 DIAGNOSIS — I10 Essential (primary) hypertension: Secondary | ICD-10-CM

## 2011-09-21 DIAGNOSIS — R072 Precordial pain: Secondary | ICD-10-CM

## 2011-09-21 DIAGNOSIS — R079 Chest pain, unspecified: Secondary | ICD-10-CM | POA: Diagnosis not present

## 2011-09-21 DIAGNOSIS — R0789 Other chest pain: Secondary | ICD-10-CM

## 2011-09-21 NOTE — Progress Notes (Signed)
Patient Name: Ralph Mendoza Date of Encounter: 09/21/2011  Primary Care Provider:  Lupita Raider, MD, MD Primary Cardiologist:  B. Jens Som, MD  Patient Profile  78 year old male with prior history of chest pain normal stress tests who presents for followup of intermittent chest pain.  Problem List   Past Medical History  Diagnosis Date  . Hypertension   . PUD (peptic ulcer disease)     Remote, history of  . BPH (benign prostatic hyperplasia)   . Urinary hesitancy   . Midsternal chest pain     a. 09/2008 Myoview: EF 56%, mild fixed thinning of inf wall particularly @ base -? attenuation.  No ischemia  . Dyspnea on exertion     a. 09/2008 NL EF on myoview.  [name] Hearing difficulty     R sided hole in eardrum, only wears L hearing aid  . Lumbar stenosis with neurogenic claudication     a. 02/2011 s/p decompressive laminectomy.  [name] History of tobacco abuse    Past Surgical History  Procedure Date  . Tympanomastoidectomy     Left modified radical with type 3 tympanoplasty  . Hernia repair     Left  . Lumbar laminectomy/decompression microdiscectomy 03/04/2011    Procedure: LUMBAR LAMINECTOMY/DECOMPRESSION MICRODISCECTOMY;  Surgeon: Cristi [name];  Location: [facility];  Service: Neurosurgery;  Laterality: N/A;  LUMBAR THREE-FOUR ,FOUR-FIVE LAMINECTOMIES    Allergies  No Known Allergies  HPI  78 year old male with the above problem list.  He has not not been seen in several years.  Overall, he has been doing well.  He swims most days of the week without any significant chest pain or dyspnea.  He does have an occasional pain in the center of his chest which can last a few minutes and resolve spontaneously.  There are no associated symptoms with this.  The symptom is concerning to him however.  He denies any PND, orthopnea, dizziness, syncope, edema, or early satiety.  Home Medications  Prior to Admission medications   Medication Sig Start Date End Date Taking?  Authorizing Provider  benazepril-hydrochlorthiazide (LOTENSIN HCT) 20-12.5 MG per tablet Take 1 tablet by mouth daily.     Yes Historical Provider, MD    Review of Systems Occasional central chest discomfort occurring at rest and resolving spontaneously.  He is active and swims regularly. All other systems reviewed and are otherwise negative except as noted above.  Physical Exam  Blood pressure 138/68, pulse 61, height 5\' 11"  (1.803 m), weight 185 lb (83.915 kg).  General: Pleasant, NAD Psych: Normal affect. Neuro: Alert and oriented X 3. Moves all extremities spontaneously. HEENT: Normal  Neck: Supple without bruits or JVD. Lungs:  Resp regular and unlabored, CTA. Heart: RRR no s3, s4, or murmurs. Abdomen: Soft, non-tender, non-distended, BS + x 4.  Extremities: No clubbing, cyanosis or edema. DP/PT/Radials 1+ and equal bilaterally.  Accessory Clinical Findings  ECG - regular sinus rhythm, first degree AV block with sinus arrhythmia.  Rate of 61.  Assessment & Plan  1.  midsternal chest pain: Patient has occasional resting chest discomfort which resolved spontaneously.  He is otherwise active without chest discomfort while swimming.  We will obtain a Lexus scan Myoview has been a few years and he has significant concern regarding his chest pain.  2.  Hypertension: this is stable on his current medications.  3.  Disposition: Followup with Dr. Jens Som in approximately 6 months or sooner if necessary.     Nicolasa Ducking, NP 09/21/2011, 5:45  PM   

## 2011-09-21 NOTE — Patient Instructions (Addendum)
Your physician wants you to follow-up in: 6 months with Dr Jens Som.  You will receive a reminder letter in the mail two months in advance. If you don't receive a letter, please call our office to schedule the follow-up appointment.  Your physician has requested that you have a lexiscan myoview. For further information please visit [email or web address] Please follow instruction sheet, as given.

## 2011-09-29 ENCOUNTER — Ambulatory Visit (HOSPITAL_COMMUNITY): Payer: Medicare Other | Attending: Nurse Practitioner | Admitting: Radiology

## 2011-09-29 VITALS — BP 132/63 | HR 53 | Ht 71.0 in | Wt 183.0 lb

## 2011-09-29 DIAGNOSIS — R079 Chest pain, unspecified: Secondary | ICD-10-CM | POA: Diagnosis not present

## 2011-09-29 DIAGNOSIS — R0602 Shortness of breath: Secondary | ICD-10-CM

## 2011-09-29 MED ORDER — TECHNETIUM TC 99M TETROFOSMIN IV KIT
10.0000 | PACK | Freq: Once | INTRAVENOUS | Status: AC | PRN
  Administered 2011-09-29: 10 via INTRAVENOUS

## 2011-09-29 MED ORDER — TECHNETIUM TC 99M TETROFOSMIN IV KIT
30.0000 | PACK | Freq: Once | INTRAVENOUS | Status: AC | PRN
  Administered 2011-09-29: 30 via INTRAVENOUS

## 2011-09-29 MED ORDER — REGADENOSON 0.4 MG/5ML IV SOLN
0.4000 mg | Freq: Once | INTRAVENOUS | Status: AC
  Administered 2011-09-29: 0.4 mg via INTRAVENOUS

## 2011-09-29 NOTE — Progress Notes (Signed)
[facility] SITE 3 NUCLEAR MED [address] [place] [address] [phone]  Cardiology Nuclear Med Study  Ralph Mendoza is a 78 y.o. male     MRN : [id number]     DOB: 07-10-1920  Procedure Date: 09/29/2011  Nuclear Med Background Indication for Stress Test:  Evaluation for Ischemia History:  '10 MPS:No ischemia, EF=56%. Cardiac Risk Factors: Family History - CAD, History of Smoking, Hypertension and Lipids  Symptoms:  Chest Tightness (last episode of chest discomfort was about one month ago) and DOE   Nuclear Pre-Procedure Caffeine/Decaff Intake:  None NPO After: 7:00pm   Lungs:  clear O2 Sat: 98% on room air. IV 0.9% NS with Angio Cath:  20g  IV Site: R Wrist  IV Started by:  Cathlyn Parsons, RN  Chest Size (in):  40 Cup Size: n/a  Height: 5\' 11"  (1.803 m)  Weight:  183 lb (83.008 kg)  BMI:  Body mass index is 25.52 kg/(m^2). Tech Comments:  n/a    Nuclear Med Study 1 or 2 day study: 1 day  Stress Test Type:  Treadmill/Lexiscan  Reading MD: Arvilla Meres, MD  Order Authorizing Provider:  Olga Millers, MD and Ward Givens, NP  Resting Radionuclide: Technetium 49m Tetrofosmin  Resting Radionuclide Dose: 11.0 mCi   Stress Radionuclide:  Technetium 55m Tetrofosmin  Stress Radionuclide Dose: 32.9 mCi           Stress Protocol Rest HR: 53 Stress HR: 120  Rest BP: 132/63 Stress BP: 147/42  Exercise Time (min): 2:00 METS: n/a   Predicted Max HR: 131 bpm % Max HR: 91.6 bpm Rate Pressure Product: 41324   Dose of Adenosine (mg):  n/a Dose of Lexiscan: 0.4 mg  Dose of Atropine (mg): n/a Dose of Dobutamine: n/a mcg/kg/min (at max HR)  Stress Test Technologist: Smiley Houseman, CMA-N  Nuclear Technologist:  Domenic Polite, CNMT     Rest Procedure:  Myocardial perfusion imaging was performed at rest 45 minutes following the intravenous administration of Technetium 68m Tetrofosmin.  Rest ECG: 1st degree AVB with occasional PAC's and PVC's with  couplets.  Stress Procedure:  The patient received IV Lexiscan 0.4 mg over 15-seconds with concurrent low level exercise and then Technetium 80m Tetrofosmin was injected at 30-seconds while the patient continued walking one more minute. There were frequent PVC's with rare couplets, PAC's and runs of junctional rhythm with Lexiscan bolus.  There were no diagnostic ST-T wave changes.  Quantitative spect images were obtained after a 45-minute delay.  Stress ECG: Insignificant upsloping ST segment depression.  QPS Raw Data Images:  Normal; no motion artifact; normal heart/lung ratio. Stress Images:  Normal homogeneous uptake in all areas of the myocardium. Rest Images:  Normal homogeneous uptake in all areas of the myocardium. Subtraction (SDS):  Normal Transient Ischemic Dilatation (Normal <1.22):  0.99 Lung/Heart Ratio (Normal <0.45):  0.35  Quantitative Gated Spect Images QGS EDV:  131 ml QGS ESV:  65 ml  Impression Exercise Capacity:  Lexiscan with low level exercise. BP Response:  Normal blood pressure response. Clinical Symptoms:  No significant symptoms noted. ECG Impression:  Insignificant upsloping ST segment depression. Comparison with Prior Nuclear Study: No images to compare  Overall Impression:  Normal stress nuclear study. There is a very small fixed defect at the base of the inferior wall which is likely diaphragmatic attenuation.   LV Ejection Fraction: 51%.  LV Wall Motion:  EF low normal. No regional wall motion abnormalities.  [name],MD 4:42 PM

## 2011-10-05 ENCOUNTER — Telehealth: Payer: Self-pay | Admitting: Cardiology

## 2011-10-05 NOTE — Telephone Encounter (Signed)
Patient aware of Lexiscan Myoview results.

## 2011-10-05 NOTE — Telephone Encounter (Signed)
New msg Pt was calling about test results from last week

## 2011-10-05 NOTE — Telephone Encounter (Signed)
Line busy

## 2011-10-05 NOTE — Telephone Encounter (Signed)
Fu call °Pt calling back again °

## 2011-10-07 ENCOUNTER — Telehealth: Payer: Self-pay | Admitting: Cardiology

## 2011-10-07 NOTE — Telephone Encounter (Signed)
Patient would a return call from Nurse DM specifically. He can be reached at [phone].

## 2011-10-07 NOTE — Telephone Encounter (Signed)
Spoke with pt, questions regarding nuclear test answered.

## 2011-12-18 ENCOUNTER — Telehealth: Payer: Self-pay | Admitting: Internal Medicine

## 2011-12-18 NOTE — Telephone Encounter (Signed)
Patient c/o chest pain that has been present for several months. He recently was cleared by Cardiology with a normal Myoview in July.  He has intermittent chest pain and has a history of ulcers several years ago.  He is advised to start on Prilosec OTC.  He will call back if anything changes or his symptoms worsen

## 2012-01-04 ENCOUNTER — Encounter: Payer: Self-pay | Admitting: *Deleted

## 2012-01-06 ENCOUNTER — Encounter: Payer: Self-pay | Admitting: Internal Medicine

## 2012-01-20 DIAGNOSIS — N289 Disorder of kidney and ureter, unspecified: Secondary | ICD-10-CM | POA: Diagnosis not present

## 2012-01-26 ENCOUNTER — Ambulatory Visit (INDEPENDENT_AMBULATORY_CARE_PROVIDER_SITE_OTHER): Payer: Medicare Other | Admitting: Internal Medicine

## 2012-01-26 ENCOUNTER — Encounter: Payer: Self-pay | Admitting: Internal Medicine

## 2012-01-26 VITALS — BP 134/72 | HR 60 | Ht 71.0 in | Wt 183.0 lb

## 2012-01-26 DIAGNOSIS — R079 Chest pain, unspecified: Secondary | ICD-10-CM | POA: Diagnosis not present

## 2012-01-26 DIAGNOSIS — R1319 Other dysphagia: Secondary | ICD-10-CM

## 2012-01-26 DIAGNOSIS — R1013 Epigastric pain: Secondary | ICD-10-CM | POA: Diagnosis not present

## 2012-01-26 NOTE — Progress Notes (Signed)
Ralph Mendoza [birth year] MRN [id number]        History of Present Illness:  This is a 78 year old white male who is here  to discuss colonoscopy and evaluate chest pain. He has been complaining of subxiphoid discomfort and difficulty swallowing solids. He denies heartburn. He gives  history of bleeding gastric ulcer more than 10 years ago although we  Have not  been able to find the record of it. He report being endoscoped endoscoped  And having  an ulcer His weight has been stable. He has been mildly anemic. Last hemoglobin November 2012 was 12.6 hematocrit 37.0 with MCV of 90. Recent lab reports from [facility] and [facility] mention mild anemia. He is due for recall colonoscopy. Last colon exam November 2003 showed mild diverticulosis of the left colon. He denies rectal bleeding. His bowel habits are regular   Past Medical History  Diagnosis Date  . Hypertension   . PUD (peptic ulcer disease)     Remote, history of  . BPH (benign prostatic hyperplasia)   . Urinary hesitancy   . Midsternal chest pain     a. 09/2008 Myoview: EF 56%, mild fixed thinning of inf wall particularly @ base -? attenuation.  No ischemia  . Dyspnea on exertion     a. 09/2008 NL EF on myoview.  [name] Hearing difficulty     R sided hole in eardrum, only wears L hearing aid  . Lumbar stenosis with neurogenic claudication     a. 02/2011 s/p decompressive laminectomy.  [name] History of tobacco abuse    Past Surgical History  Procedure Date  . Tympanomastoidectomy     Left modified radical with type 3 tympanoplasty  . Hernia repair     Left  . Lumbar laminectomy/decompression microdiscectomy 03/04/2011    Procedure: LUMBAR LAMINECTOMY/DECOMPRESSION MICRODISCECTOMY;  Surgeon: Cristi [name];  Location: [facility];  Service: Neurosurgery;  Laterality: N/A;  LUMBAR THREE-FOUR ,FOUR-FIVE LAMINECTOMIES    reports that he has quit smoking. He has never used smokeless tobacco. He reports that he does not drink  alcohol or use illicit drugs. family history includes Appendicitis in his father; Diabetes in his sister; and Lung cancer in his mother.  There is no history of Coronary artery disease and Colon cancer. No Known Allergies      Review of Systems: Positive for solid food dysphagia. Subxiphoid discomfort . Urinary symptoms evaluated by urology  The remainder of the 10 point ROS is negative except as outlined in H&P   Physical Exam: General appearance  Well developed, in no distress. Eyes- non icteric. HEENT nontraumatic, normocephalic. Mouth no lesions, tongue papillated, no cheilosis. Neck supple without adenopathy, thyroid not enlarged, no carotid bruits, no JVD. Lungs Clear to auscultation bilaterally. Cor normal S1, normal S2, regular rhythm, no murmur,  quiet precordium. Abdomen: Soft nontender abdomen with normoactive bowel sounds. Mild discomfort in epigastrium. No abnormal pulsations. Liver edge at costal margin, rectus diathesis Rectal: Soft Hemoccult negative stool Extremities no pedal edema. Skin no lesions. Neurological alert and oriented x 3. Psychological normal mood and affect.  Assessment and Plan:  78 year old gentleman with subxiphoid discomfort and dysphagia suggestive of esophageal stricture or possibly hiatal hernia. Consider esophageal dysmotility or presbyesophagus to be a contributing factor. He denies any symptoms of gastroesophageal reflux, he has a history of bleeding ulcer. We will proceed with upper endoscopy leading to check his H. Pylori  Colorectal screening. Last colonoscopy in 2003. I recommended to him not proceed with colonoscopy due  to his age limitations.. He never had any polyps, or a family history of colon cancer. He is Hemoccult negative today. The risks of the procedure , I believe, exceed the benefits. He is fine with that decision.  Referral to a nephrologist as per urology eval- chronic renal disease   01/26/2012 Lina Sar

## 2012-01-26 NOTE — Patient Instructions (Addendum)
You have been scheduled for an endoscopy with propofol. Please follow written instructions given to you at your visit today. If you use inhalers (even only as needed), please bring them with you on the day of your procedure. We will request your labwork done on 01/05/12 from the [facility]. CC: Dr Lupita Raider,

## 2012-01-29 ENCOUNTER — Encounter: Payer: Self-pay | Admitting: Internal Medicine

## 2012-01-29 ENCOUNTER — Ambulatory Visit (AMBULATORY_SURGERY_CENTER): Payer: Medicare Other | Admitting: Internal Medicine

## 2012-01-29 VITALS — BP 130/57 | HR 46 | Temp 97.6°F | Resp 18 | Ht 71.0 in | Wt 183.0 lb

## 2012-01-29 DIAGNOSIS — K209 Esophagitis, unspecified without bleeding: Secondary | ICD-10-CM

## 2012-01-29 DIAGNOSIS — K2100 Gastro-esophageal reflux disease with esophagitis, without bleeding: Secondary | ICD-10-CM

## 2012-01-29 DIAGNOSIS — R079 Chest pain, unspecified: Secondary | ICD-10-CM | POA: Diagnosis not present

## 2012-01-29 DIAGNOSIS — K222 Esophageal obstruction: Secondary | ICD-10-CM

## 2012-01-29 DIAGNOSIS — E785 Hyperlipidemia, unspecified: Secondary | ICD-10-CM | POA: Diagnosis not present

## 2012-01-29 DIAGNOSIS — I10 Essential (primary) hypertension: Secondary | ICD-10-CM | POA: Diagnosis not present

## 2012-01-29 MED ORDER — SODIUM CHLORIDE 0.9 % IV SOLN: 500.0000 mL | INTRAVENOUS | Status: DC

## 2012-01-29 NOTE — Patient Instructions (Addendum)

## 2012-01-29 NOTE — Op Note (Signed)
[facility] [address] [place], [address]   ENDOSCOPY PROCEDURE REPORT  PATIENT: Ralph Mendoza, Ralph Mendoza  MR#: [id number] BIRTHDATE: 1920/05/06 , 89  yrs. old GENDER: Male ENDOSCOPIST: Hart Carwin, MD REFERRED BY:  Lupita Raider, M.D. PROCEDURE DATE:  01/29/2012 PROCEDURE:  EGD w/ biopsy and Savary dilation of esophagus ASA CLASS:     Class III INDICATIONS:  chest pain.   dysphagia. MEDICATIONS: MAC sedation, administered by CRNA and Fentanyl-Detailed 130 mg IV TOPICAL ANESTHETIC: Cetacaine Spray  DESCRIPTION OF PROCEDURE: After the risks benefits and alternatives of the procedure were thoroughly explained, informed consent was obtained.  The LB GIF-H180 [id number] endoscope was introduced through the mouth and advanced to the second portion of the duodenum. Without limitations.  The instrument was slowly withdrawn as the mucosa was fully examined.      ESOPHAGUS: A stricture was found at the gastroesophageal junction.Benign appearing fibrous stricture, 14mm in diameter, also erosion c/w esophagitis  The stenosis was traversable with the endoscope.The stristure was dilated with Savary dilators 14,15,16 mm, there was small amount of blood on the last dilator. There was a 2 cm hiatal hernia  Retroflexed views revealed no abnormalities. The scope was then withdrawn from the patient and the procedure completed.  COMPLICATIONS: There were no complications. ENDOSCOPIC IMPRESSION: Stricture was found at the gastroesophageal junction , benign appearing 14 mm stristure dilated with 14,15,16 mm dilators 2 cm hiatal hernia  RECOMMENDATIONS: 1.  await biopsy results 2.  anti-reflux regimen to be follow 3.  continue PPI chest pain possibly caused by the esophageal stricture and esophagitis REPEAT EXAM: no recall  eSigned:  Hart Carwin, MD 01/29/2012 9:37 AM   CC:

## 2012-01-29 NOTE — Progress Notes (Signed)
Patient did not experience any of the following events: a burn prior to discharge; a fall within the facility; wrong site/side/patient/procedure/implant event; or a hospital transfer or hospital admission upon discharge from the facility. (G8907) Patient did not have preoperative order for IV antibiotic SSI prophylaxis. (G8918)  

## 2012-01-29 NOTE — Progress Notes (Signed)

## 2012-02-01 ENCOUNTER — Telehealth: Payer: Self-pay

## 2012-02-01 DIAGNOSIS — N183 Chronic kidney disease, stage 3 unspecified: Secondary | ICD-10-CM | POA: Diagnosis not present

## 2012-02-01 DIAGNOSIS — D649 Anemia, unspecified: Secondary | ICD-10-CM | POA: Diagnosis not present

## 2012-02-01 DIAGNOSIS — I10 Essential (primary) hypertension: Secondary | ICD-10-CM | POA: Diagnosis not present

## 2012-02-01 NOTE — Telephone Encounter (Signed)
  Follow up Call-  Call back number 01/29/2012  Post procedure Call Back phone  # [phone]  Permission to leave phone message Yes     Patient questions:  Do you have a fever, pain , or abdominal swelling? no Pain Score  0 *  Have you tolerated food without any problems? yes  Have you been able to return to your normal activities? yes  Do you have any questions about your discharge instructions: Diet   no Medications  no Follow up visit  no  Do you have questions or concerns about your Care? no  Actions: * If pain score is 4 or above: No action needed, pain <4.

## 2012-02-02 ENCOUNTER — Encounter: Payer: Self-pay | Admitting: Internal Medicine

## 2012-02-05 ENCOUNTER — Encounter: Payer: Self-pay | Admitting: Cardiology

## 2012-03-01 DIAGNOSIS — H524 Presbyopia: Secondary | ICD-10-CM | POA: Diagnosis not present

## 2012-03-01 DIAGNOSIS — H43819 Vitreous degeneration, unspecified eye: Secondary | ICD-10-CM | POA: Diagnosis not present

## 2012-03-01 DIAGNOSIS — H353190 Nonexudative age-related macular degeneration, unspecified eye, stage unspecified: Secondary | ICD-10-CM | POA: Diagnosis not present

## 2012-03-01 DIAGNOSIS — H5210 Myopia, unspecified eye: Secondary | ICD-10-CM | POA: Diagnosis not present

## 2012-03-15 DIAGNOSIS — M545 Low back pain, unspecified: Secondary | ICD-10-CM | POA: Diagnosis not present

## 2012-03-15 DIAGNOSIS — I10 Essential (primary) hypertension: Secondary | ICD-10-CM | POA: Diagnosis not present

## 2012-03-15 DIAGNOSIS — Z0000 Encounter for general adult medical examination without abnormal findings: Secondary | ICD-10-CM | POA: Diagnosis not present

## 2012-03-15 DIAGNOSIS — D649 Anemia, unspecified: Secondary | ICD-10-CM | POA: Diagnosis not present

## 2012-03-15 DIAGNOSIS — E782 Mixed hyperlipidemia: Secondary | ICD-10-CM | POA: Diagnosis not present

## 2012-03-15 DIAGNOSIS — Z1331 Encounter for screening for depression: Secondary | ICD-10-CM | POA: Diagnosis not present

## 2012-03-15 DIAGNOSIS — N400 Enlarged prostate without lower urinary tract symptoms: Secondary | ICD-10-CM | POA: Diagnosis not present

## 2012-03-15 DIAGNOSIS — Z23 Encounter for immunization: Secondary | ICD-10-CM | POA: Diagnosis not present

## 2012-03-24 ENCOUNTER — Encounter: Payer: Self-pay | Admitting: Cardiology

## 2012-03-24 ENCOUNTER — Ambulatory Visit (INDEPENDENT_AMBULATORY_CARE_PROVIDER_SITE_OTHER): Payer: Medicare Other | Admitting: Cardiology

## 2012-03-24 VITALS — BP 140/62 | HR 57 | Ht 70.0 in | Wt 185.0 lb

## 2012-03-24 DIAGNOSIS — R002 Palpitations: Secondary | ICD-10-CM | POA: Diagnosis not present

## 2012-03-24 DIAGNOSIS — I10 Essential (primary) hypertension: Secondary | ICD-10-CM

## 2012-03-24 DIAGNOSIS — E7800 Pure hypercholesterolemia, unspecified: Secondary | ICD-10-CM | POA: Diagnosis not present

## 2012-03-24 DIAGNOSIS — R0789 Other chest pain: Secondary | ICD-10-CM

## 2012-03-24 DIAGNOSIS — R072 Precordial pain: Secondary | ICD-10-CM | POA: Diagnosis not present

## 2012-03-24 NOTE — Assessment & Plan Note (Signed)
Management per primary care. 

## 2012-03-24 NOTE — Assessment & Plan Note (Signed)
No further symptoms.he exercises routinely with no chest pain. Myoview negative. No plans for further evaluation.

## 2012-03-24 NOTE — Progress Notes (Signed)
   HPI: 78 year old male for followup of chest pain. Seen recently by one of by our extenders for chest pain. Myoview in June of 2013 showed diaphragmatic attenuation but no ischemia. Ejection fraction was 51%. Since that time there is no dyspnea, chest pain, palpitations or syncope. No pedal edema. He is being evaluated for mild renal insufficiency.  Current Outpatient Prescriptions  Medication Sig Dispense Refill  . benazepril-hydrochlorthiazide (LOTENSIN HCT) 20-12.5 MG per tablet Take 1 tablet by mouth daily.           Past Medical History  Diagnosis Date  . Hypertension   . PUD (peptic ulcer disease)     Remote, history of  . BPH (benign prostatic hyperplasia)   . Urinary hesitancy   . Midsternal chest pain     a. 09/2008 Myoview: EF 56%, mild fixed thinning of inf wall particularly @ base -? attenuation.  No ischemia  . Dyspnea on exertion     a. 09/2008 NL EF on myoview.  [name] Hearing difficulty     R sided hole in eardrum, only wears L hearing aid  . Lumbar stenosis with neurogenic claudication     a. 02/2011 s/p decompressive laminectomy.  [name] History of tobacco abuse     Past Surgical History  Procedure Date  . Tympanomastoidectomy     Left modified radical with type 3 tympanoplasty  . Hernia repair     Left  . Lumbar laminectomy/decompression microdiscectomy 03/04/2011    Procedure: LUMBAR LAMINECTOMY/DECOMPRESSION MICRODISCECTOMY;  Surgeon: Cristi [name];  Location: [facility];  Service: Neurosurgery;  Laterality: N/A;  LUMBAR THREE-FOUR ,FOUR-FIVE LAMINECTOMIES    History   Social History  . Marital Status: Married    Spouse Name: N/A    Number of Children: 2  . Years of Education: N/A   Occupational History  . [employer]     Retired   Social History Main Topics  . Smoking status: Former Games developer  . Smokeless tobacco: Never Used     Comment: Remote history of smoking  . Alcohol Use: No  . Drug Use: No  . Sexually Active: Not on file   Other  Topics Concern  . Not on file   Social History Narrative   Lives with his wife in [place].He swims daily.He has 2 grown daughters.1 Caffeine drink daily     ROS: some hesitancy and low back pain but no fevers or chills, productive cough, hemoptysis, dysphasia, odynophagia, melena, hematochezia, dysuria, hematuria, rash, seizure activity, orthopnea, PND, pedal edema, claudication. Remaining systems are negative.  Physical Exam: Well-developed well-nourished in no acute distress.  Skin is warm and dry.  HEENT is normal.  Neck is supple.  Chest is clear to auscultation with normal expansion.  Cardiovascular exam is regular rate and rhythm.  Abdominal exam nontender or distended. No masses palpated. Extremities show no edema. neuro grossly intact  ECG sinus bradycardia at with nonconducted PAC. Nonspecific ST changes. First degree AV block.

## 2012-03-24 NOTE — Assessment & Plan Note (Signed)
Blood pressure is controlled. Continue present medications. 

## 2012-03-24 NOTE — Patient Instructions (Addendum)
Your physician recommends that you schedule a follow-up appointment in: AS NEEDED  

## 2012-03-28 DIAGNOSIS — N289 Disorder of kidney and ureter, unspecified: Secondary | ICD-10-CM | POA: Diagnosis not present

## 2012-03-29 DIAGNOSIS — I499 Cardiac arrhythmia, unspecified: Secondary | ICD-10-CM | POA: Diagnosis not present

## 2012-03-29 DIAGNOSIS — N183 Chronic kidney disease, stage 3 unspecified: Secondary | ICD-10-CM | POA: Diagnosis not present

## 2012-04-01 ENCOUNTER — Telehealth: Payer: Self-pay | Admitting: *Deleted

## 2012-04-01 DIAGNOSIS — R002 Palpitations: Secondary | ICD-10-CM

## 2012-04-01 NOTE — Telephone Encounter (Signed)
Fax and EKG received from [facility] physicians and reviewed by dr Jens Som. Spoke with pt, Aware of dr Ludwig Clarks recommendations. Follow up scheduled

## 2012-04-04 ENCOUNTER — Telehealth: Payer: Self-pay | Admitting: *Deleted

## 2012-04-04 NOTE — Telephone Encounter (Signed)
Pt was enrolled for event monitor to be mailed home. 04/04/12. TK 

## 2012-04-06 ENCOUNTER — Encounter: Payer: Self-pay | Admitting: Cardiology

## 2012-04-06 DIAGNOSIS — R002 Palpitations: Secondary | ICD-10-CM | POA: Diagnosis not present

## 2012-04-11 ENCOUNTER — Encounter: Payer: Self-pay | Admitting: Cardiology

## 2012-04-14 DIAGNOSIS — N183 Chronic kidney disease, stage 3 unspecified: Secondary | ICD-10-CM | POA: Diagnosis not present

## 2012-04-14 DIAGNOSIS — I10 Essential (primary) hypertension: Secondary | ICD-10-CM | POA: Diagnosis not present

## 2012-04-19 DIAGNOSIS — H7290 Unspecified perforation of tympanic membrane, unspecified ear: Secondary | ICD-10-CM | POA: Diagnosis not present

## 2012-04-19 DIAGNOSIS — M659 Synovitis and tenosynovitis, unspecified: Secondary | ICD-10-CM | POA: Diagnosis not present

## 2012-04-29 ENCOUNTER — Telehealth: Payer: Self-pay | Admitting: *Deleted

## 2012-04-29 MED ORDER — ASPIRIN EC 81 MG PO TBEC: 81.0000 mg | DELAYED_RELEASE_TABLET | Freq: Every day | ORAL | Status: DC

## 2012-04-29 NOTE — Telephone Encounter (Signed)
pt rtn call to debra , pls call

## 2012-04-29 NOTE — Telephone Encounter (Signed)
Spoke with pt, aware of monitor results and follow up appt made.

## 2012-04-29 NOTE — Telephone Encounter (Signed)
Left message for pt to call, monitor revierwed by dr Jens Som shows sinus with occ PVC; PAF and flutter; add aspirin 81 mg po daily. Schedule f/u ov to be scheduled.

## 2012-05-03 DIAGNOSIS — N179 Acute kidney failure, unspecified: Secondary | ICD-10-CM | POA: Diagnosis not present

## 2012-05-17 ENCOUNTER — Ambulatory Visit (INDEPENDENT_AMBULATORY_CARE_PROVIDER_SITE_OTHER): Payer: Medicare Other | Admitting: Cardiology

## 2012-05-17 ENCOUNTER — Encounter: Payer: Self-pay | Admitting: Cardiology

## 2012-05-17 VITALS — BP 180/90 | HR 58 | Ht 71.0 in | Wt 184.0 lb

## 2012-05-17 DIAGNOSIS — I4891 Unspecified atrial fibrillation: Secondary | ICD-10-CM | POA: Diagnosis not present

## 2012-05-17 LAB — BASIC METABOLIC PANEL
CO2: 30 mEq/L (ref 19–32)
Chloride: 97 mEq/L (ref 96–112)
Creatinine, Ser: 1.3 mg/dL (ref 0.4–1.5)
Potassium: 3.8 mEq/L (ref 3.5–5.1)
Sodium: 134 mEq/L — ABNORMAL LOW (ref 135–145)

## 2012-05-17 LAB — TSH: TSH: 0.52 u[IU]/mL (ref 0.35–5.50)

## 2012-05-17 MED ORDER — AMLODIPINE BESYLATE 5 MG PO TABS: 5.0000 mg | ORAL_TABLET | Freq: Every day | ORAL | Status: DC

## 2012-05-17 NOTE — Assessment & Plan Note (Signed)
Management per primary care. 

## 2012-05-17 NOTE — Progress Notes (Signed)
   HPI: 79 year old male for followup of atrial fibrillation. Myoview in June of 2013 showed diaphragmatic attenuation but no ischemia. Ejection fraction was 51%. Monitor for palpitations in Dec 2013 showed sinus with PVCs, afib/flutter. I last saw him in Dec 2013. Since that time there is no dyspnea, chest pain, palpitations or syncope. No pedal edema.    Current Outpatient Prescriptions  Medication Sig Dispense Refill  . aspirin EC 81 MG tablet Take 1 tablet (81 mg total) by mouth daily.  90 tablet  3  . hydrochlorothiazide (HYDRODIURIL) 25 MG tablet Take 25 mg by mouth daily.         Past Medical History  Diagnosis Date  . Hypertension   . PUD (peptic ulcer disease)     Remote, history of  . BPH (benign prostatic hyperplasia)   . Urinary hesitancy   . Midsternal chest pain     a. 09/2008 Myoview: EF 56%, mild fixed thinning of inf wall particularly @ base -? attenuation.  No ischemia  . Dyspnea on exertion     a. 09/2008 NL EF on myoview.  [name] Hearing difficulty     R sided hole in eardrum, only wears L hearing aid  . Lumbar stenosis with neurogenic claudication     a. 02/2011 s/p decompressive laminectomy.  [name] History of tobacco abuse   . Atrial fibrillation     Past Surgical History  Procedure Date  . Tympanomastoidectomy     Left modified radical with type 3 tympanoplasty  . Hernia repair     Left  . Lumbar laminectomy/decompression microdiscectomy 03/04/2011    Procedure: LUMBAR LAMINECTOMY/DECOMPRESSION MICRODISCECTOMY;  Surgeon: Cristi [name];  Location: [facility];  Service: Neurosurgery;  Laterality: N/A;  LUMBAR THREE-FOUR ,FOUR-FIVE LAMINECTOMIES    History   Social History  . Marital Status: Married    Spouse Name: N/A    Number of Children: 2  . Years of Education: N/A   Occupational History  . [employer]     Retired   Social History Main Topics  . Smoking status: Former Games developer  . Smokeless tobacco: Never Used     Comment: Remote  history of smoking  . Alcohol Use: No  . Drug Use: No  . Sexually Active: Not on file   Other Topics Concern  . Not on file   Social History Narrative   Lives with his wife in [place].He swims daily.He has 2 grown daughters.1 Caffeine drink daily     ROS: no fevers or chills, productive cough, hemoptysis, dysphasia, odynophagia, melena, hematochezia, dysuria, hematuria, rash, seizure activity, orthopnea, PND, pedal edema, claudication. Remaining systems are negative.  Physical Exam: Well-developed well-nourished in no acute distress.  Skin is warm and dry.  HEENT is normal.  Neck is supple.  Chest is clear to auscultation with normal expansion.  Cardiovascular exam is regular rate and rhythm.  Abdominal exam nontender or distended. No masses palpated. Extremities show no edema. neuro grossly intact

## 2012-05-17 NOTE — Patient Instructions (Addendum)
Your physician recommends that you schedule a follow-up appointment in: 8 WEEKS WITH DR Jens Som  Your physician recommends that you HAVE LAB WORK TODAY  Your physician has requested that you have an echocardiogram. Echocardiography is a painless test that uses sound waves to create images of your heart. It provides your doctor with information about the size and shape of your heart and how well your heart's chambers and valves are working. This procedure takes approximately one hour. There are no restrictions for this procedure.

## 2012-05-17 NOTE — Assessment & Plan Note (Signed)
Patient is noted to have atrial fibrillation and flutter on recent monitor. His rate is controlled with no medications. He has embolic risk factors of age greater than 35 and hypertension. I will check his renal function today. If stable we will begin apixiban and discontinue aspirin. Check echocardiogram and TSH.

## 2012-05-17 NOTE — Assessment & Plan Note (Signed)
Blood pressure is elevated. Add Norvasc 5 mg daily.

## 2012-05-18 ENCOUNTER — Other Ambulatory Visit: Payer: Self-pay | Admitting: *Deleted

## 2012-05-18 DIAGNOSIS — I4891 Unspecified atrial fibrillation: Secondary | ICD-10-CM

## 2012-05-18 MED ORDER — APIXABAN 5 MG PO TABS: 5.0000 mg | ORAL_TABLET | Freq: Two times a day (BID) | ORAL | Status: DC

## 2012-05-19 ENCOUNTER — Ambulatory Visit (HOSPITAL_COMMUNITY): Payer: Medicare Other | Attending: Internal Medicine

## 2012-05-19 DIAGNOSIS — R079 Chest pain, unspecified: Secondary | ICD-10-CM | POA: Insufficient documentation

## 2012-05-19 DIAGNOSIS — I10 Essential (primary) hypertension: Secondary | ICD-10-CM | POA: Diagnosis not present

## 2012-05-19 DIAGNOSIS — Z87891 Personal history of nicotine dependence: Secondary | ICD-10-CM | POA: Diagnosis not present

## 2012-05-19 DIAGNOSIS — I4891 Unspecified atrial fibrillation: Secondary | ICD-10-CM | POA: Insufficient documentation

## 2012-05-19 DIAGNOSIS — I079 Rheumatic tricuspid valve disease, unspecified: Secondary | ICD-10-CM | POA: Insufficient documentation

## 2012-05-19 DIAGNOSIS — I080 Rheumatic disorders of both mitral and aortic valves: Secondary | ICD-10-CM | POA: Insufficient documentation

## 2012-05-19 DIAGNOSIS — I4949 Other premature depolarization: Secondary | ICD-10-CM | POA: Insufficient documentation

## 2012-05-19 DIAGNOSIS — R002 Palpitations: Secondary | ICD-10-CM | POA: Diagnosis not present

## 2012-05-19 NOTE — Progress Notes (Signed)
Echocardiogram performed.  

## 2012-06-09 ENCOUNTER — Telehealth: Payer: Self-pay | Admitting: Cardiology

## 2012-06-09 ENCOUNTER — Ambulatory Visit (INDEPENDENT_AMBULATORY_CARE_PROVIDER_SITE_OTHER): Payer: Medicare Other | Admitting: Cardiology

## 2012-06-09 ENCOUNTER — Encounter: Payer: Self-pay | Admitting: Cardiology

## 2012-06-09 VITALS — BP 160/70 | HR 80 | Wt 184.0 lb

## 2012-06-09 DIAGNOSIS — E7800 Pure hypercholesterolemia, unspecified: Secondary | ICD-10-CM

## 2012-06-09 DIAGNOSIS — I4891 Unspecified atrial fibrillation: Secondary | ICD-10-CM

## 2012-06-09 DIAGNOSIS — I10 Essential (primary) hypertension: Secondary | ICD-10-CM | POA: Diagnosis not present

## 2012-06-09 MED ORDER — LISINOPRIL 5 MG PO TABS: 5.0000 mg | ORAL_TABLET | Freq: Every day | ORAL | Status: DC

## 2012-06-09 MED ORDER — METOPROLOL SUCCINATE ER 25 MG PO TB24: 25.0000 mg | ORAL_TABLET | Freq: Every day | ORAL | Status: DC

## 2012-06-09 MED ORDER — HYDROCHLOROTHIAZIDE 25 MG PO TABS: 12.5000 mg | ORAL_TABLET | Freq: Every day | ORAL | Status: DC

## 2012-06-09 NOTE — Telephone Encounter (Signed)
Spoke with pt, lab work is not needed on the 25th. Appointment cx

## 2012-06-09 NOTE — Telephone Encounter (Signed)
Follow Up    Following up on some new prescriptions that are supposed to be sent over to [facility].

## 2012-06-09 NOTE — Telephone Encounter (Signed)
New Problem:    Patient called in wanting to know if he still needed to come in to have his blood drawn on 06/14/12.  Please call back.

## 2012-06-09 NOTE — Patient Instructions (Addendum)
Your physician recommends that you schedule a follow-up appointment in: 6-8 WEEKS WITH DR CRENSHAW  STOP AMLODIPINE  DECREASE HCTZ TO ONE HALF TABLET ONCE DAILY   START LISINOPRIL 5 MG ONCE DAILY  START METOPROLOL 25 MG ONCE DAILY  Your physician has recommended that you wear a 24 HOUR holter monitor. Holter monitors are medical devices that record the heart's electrical activity. Doctors most often use these monitors to diagnose arrhythmias. Arrhythmias are problems with the speed or rhythm of the heartbeat. The monitor is a small, portable device. You can wear one while you do your normal daily activities. This is usually used to diagnose what is causing palpitations/syncope (passing out).IN ONE WEEK  Your physician recommends that you return for lab work in ONE WEEK    TRACK BLOOD PRESSURE AT HOME

## 2012-06-09 NOTE — Assessment & Plan Note (Signed)
Blood pressure is mildly elevated.however I am adding lisinopril and Toprol for his cardiomyopathy. I will therefore discontinue his Norvasc and decrease his HCTZ. Check potassium and renal function in one week. I will also check his hemoglobin at that time given recent addition of apixiban.

## 2012-06-09 NOTE — Progress Notes (Signed)
HPI: 79 year old male for followup of atrial fibrillation. Myoview in June of 2013 showed diaphragmatic attenuation but no ischemia. Ejection fraction was 51%. Monitor for palpitations in Dec 2013 showed sinus with PVCs, afib/flutter. Echocardiogram in January of 2014 showed an ejection fraction of 30-35%. There was mild aortic and mitral regurgitation. TSH in January 2014 0.52. I last saw him in Jan 2014. Since that time there is no dyspnea, chest pain, palpitations or syncope. No pedal edema.    Current Outpatient Prescriptions  Medication Sig Dispense Refill  . acetaminophen (TYLENOL) 325 MG tablet Take 650 mg by mouth every 6 (six) hours as needed for pain.      [name] amLODipine (NORVASC) 5 MG tablet Take 1 tablet (5 mg total) by mouth daily.  30 tablet  11  . apixaban (ELIQUIS) 5 MG TABS tablet Take 1 tablet (5 mg total) by mouth 2 (two) times daily.  180 tablet  3  . hydrochlorothiazide (HYDRODIURIL) 25 MG tablet Take 25 mg by mouth daily.       No current facility-administered medications for this visit.     Past Medical History  Diagnosis Date  . Hypertension   . PUD (peptic ulcer disease)     Remote, history of  . BPH (benign prostatic hyperplasia)   . Urinary hesitancy   . Midsternal chest pain     a. 09/2008 Myoview: EF 56%, mild fixed thinning of inf wall particularly @ base -? attenuation.  No ischemia  . Dyspnea on exertion     a. 09/2008 NL EF on myoview.  [name] Hearing difficulty     R sided hole in eardrum, only wears L hearing aid  . Lumbar stenosis with neurogenic claudication     a. 02/2011 s/p decompressive laminectomy.  [name] History of tobacco abuse   . Atrial fibrillation     Past Surgical History  Procedure Laterality Date  . Tympanomastoidectomy      Left modified radical with type 3 tympanoplasty  . Hernia repair      Left  . Lumbar laminectomy/decompression microdiscectomy  03/04/2011    Procedure: LUMBAR LAMINECTOMY/DECOMPRESSION MICRODISCECTOMY;  Surgeon:  Cristi [name];  Location: [facility];  Service: Neurosurgery;  Laterality: N/A;  LUMBAR THREE-FOUR ,FOUR-FIVE LAMINECTOMIES    History   Social History  . Marital Status: Married    Spouse Name: N/A    Number of Children: 2  . Years of Education: N/A   Occupational History  . [employer]     Retired   Social History Main Topics  . Smoking status: Former Games developer  . Smokeless tobacco: Never Used     Comment: Remote history of smoking  . Alcohol Use: No  . Drug Use: No  . Sexually Active: Not on file   Other Topics Concern  . Not on file   Social History Narrative   Lives with his wife in [place].   He swims daily.   He has 2 grown daughters.   1 Caffeine drink daily     ROS: knee arthralgias but no fevers or chills, productive cough, hemoptysis, dysphasia, odynophagia, melena, hematochezia, dysuria, hematuria, rash, seizure activity, orthopnea, PND, pedal edema, claudication. Remaining systems are negative.  Physical Exam: Well-developed well-nourished in no acute distress.  Skin is warm and dry.  HEENT is normal.  Neck is supple.  Chest is clear to auscultation with normal expansion.  Cardiovascular exam is irregular Abdominal exam nontender or distended. No masses palpated. Extremities show no edema. neuro  grossly intact  ECG atrial fibrillation at a rate of 80. No ST changes.

## 2012-06-09 NOTE — Assessment & Plan Note (Signed)
Patient is in atrial fibrillation today.however he continues to be asymptomatic. I think the best option is rate controlled and anticoagulations. I will add Toprol 25 mg daily. Continue Apixiban. Schedule 48 hour Holter monitor in one week to make sure that rate is controlled.

## 2012-06-09 NOTE — Assessment & Plan Note (Signed)
Management per primary care. 

## 2012-06-09 NOTE — Telephone Encounter (Signed)
Left message for pt, scripts resent to pharm

## 2012-06-09 NOTE — Assessment & Plan Note (Signed)
Patient is noted to have a new cardiomyopathy. Etiology is unclear. Previous nuclear study showed no ischemia. Recent TSH normal. Question related to either hypertension or atrial fibrillation with a rapid ventricular response. I would like to be conservative if possible given his age. Discontinue amlodipine. Change HCTZ to 12.5 mg daily. Add lisinopril 5 mg daily and Toprol 25 mg daily. We will continue to titrate medications as tolerated by pulse and blood pressure. Once fully titrated we will repeat his echocardiogram. If LV function continues to be low I will consider further ischemia evaluation at that time. Note he is not having chest pain. We will also schedule a Holter monitor to make sure that his atrial fibrillation is controlled and that tachycardia is not contributing to his cardiomyopathy.

## 2012-06-14 ENCOUNTER — Other Ambulatory Visit: Payer: Medicare Other

## 2012-06-16 ENCOUNTER — Encounter: Payer: Self-pay | Admitting: Cardiology

## 2012-06-16 ENCOUNTER — Ambulatory Visit (INDEPENDENT_AMBULATORY_CARE_PROVIDER_SITE_OTHER): Payer: Medicare Other

## 2012-06-16 ENCOUNTER — Other Ambulatory Visit (INDEPENDENT_AMBULATORY_CARE_PROVIDER_SITE_OTHER): Payer: Medicare Other

## 2012-06-16 DIAGNOSIS — I4891 Unspecified atrial fibrillation: Secondary | ICD-10-CM

## 2012-06-16 DIAGNOSIS — R002 Palpitations: Secondary | ICD-10-CM

## 2012-06-16 LAB — CBC WITH DIFFERENTIAL/PLATELET
Basophils Absolute: 0 10*3/uL (ref 0.0–0.1)
Eosinophils Absolute: 0.1 10*3/uL (ref 0.0–0.7)
HCT: 37.4 % — ABNORMAL LOW (ref 39.0–52.0)
Hemoglobin: 12.5 g/dL — ABNORMAL LOW (ref 13.0–17.0)
Lymphocytes Relative: 40.8 % (ref 12.0–46.0)
Lymphs Abs: 2 10*3/uL (ref 0.7–4.0)
MCHC: 33.3 g/dL (ref 30.0–36.0)
Monocytes Absolute: 0.6 10*3/uL (ref 0.1–1.0)
Neutro Abs: 2.2 10*3/uL (ref 1.4–7.7)
RDW: 14.1 % (ref 11.5–14.6)

## 2012-06-16 LAB — BASIC METABOLIC PANEL
Calcium: 9 mg/dL (ref 8.4–10.5)
Creatinine, Ser: 1.3 mg/dL (ref 0.4–1.5)
GFR: 55.6 mL/min — ABNORMAL LOW (ref >60.00)

## 2012-06-16 NOTE — Progress Notes (Signed)
Placed a 24 hrs monitor on patient and went over instructions on how to use it and when to return it 

## 2012-07-08 ENCOUNTER — Other Ambulatory Visit: Payer: Self-pay

## 2012-07-08 ENCOUNTER — Telehealth: Payer: Self-pay

## 2012-07-08 NOTE — Telephone Encounter (Signed)
Pt was told to stop his metoprolol due to bradycardia on his holter monitor report.  He agrees but is concerned about his bp going up.  He will check his bp and write it down.  He will discuss bp readings and the stopping of his metoprolol at his next appt with Dr Jens Som on 07/21/12.

## 2012-07-12 ENCOUNTER — Ambulatory Visit: Payer: Medicare Other | Admitting: Cardiology

## 2012-07-21 ENCOUNTER — Encounter: Payer: Self-pay | Admitting: Cardiology

## 2012-07-21 ENCOUNTER — Ambulatory Visit (INDEPENDENT_AMBULATORY_CARE_PROVIDER_SITE_OTHER): Payer: Medicare Other | Admitting: Cardiology

## 2012-07-21 VITALS — BP 179/66 | HR 76 | Ht 70.5 in | Wt 184.1 lb

## 2012-07-21 DIAGNOSIS — E7800 Pure hypercholesterolemia, unspecified: Secondary | ICD-10-CM

## 2012-07-21 DIAGNOSIS — I428 Other cardiomyopathies: Secondary | ICD-10-CM

## 2012-07-21 DIAGNOSIS — I10 Essential (primary) hypertension: Secondary | ICD-10-CM | POA: Diagnosis not present

## 2012-07-21 DIAGNOSIS — I420 Dilated cardiomyopathy: Secondary | ICD-10-CM

## 2012-07-21 DIAGNOSIS — I4891 Unspecified atrial fibrillation: Secondary | ICD-10-CM | POA: Diagnosis not present

## 2012-07-21 MED ORDER — LISINOPRIL 20 MG PO TABS: 20.0000 mg | ORAL_TABLET | Freq: Every day | ORAL | Status: DC

## 2012-07-21 NOTE — Assessment & Plan Note (Signed)
Etiology of patient's LV dysfunction unclear. I would like to be conservative if possible given age. He did not tolerate beta-blockade because of bradycardia. I will increase his lisinopril to 20 mg daily. Check potassium and renal function in one week. We will plan to repeat his echocardiogram after his medications have been titrated. Question if hypertension is contributing to his cardiomyopathy.

## 2012-07-21 NOTE — Patient Instructions (Addendum)
Your physician recommends that you schedule a follow-up appointment in: 8 WEEKS WITH DR CRENSHAW  INCREASE LISINOPRIL 20 MG ONCE DAILY  Your physician recommends that you return for lab work in: ONE WEEK

## 2012-07-21 NOTE — Assessment & Plan Note (Signed)
Blood pressure is elevated. Increase lisinopril.

## 2012-07-21 NOTE — Addendum Note (Signed)
Addended by: Freddi Starr on: 07/21/2012 10:27 AM   Modules accepted: Orders

## 2012-07-21 NOTE — Progress Notes (Signed)
HPI: Pleasant male for followup of atrial fibrillation. Myoview in June of 2013 showed diaphragmatic attenuation but no ischemia. Ejection fraction was 51%. Monitor for palpitations in Dec 2013 showed sinus with PVCs, afib/flutter. Echocardiogram in January of 2014 showed an ejection fraction of 30-35%. There was mild aortic and mitral regurgitation. TSH in January 2014 0.52. We have elected rate control and anticoagulation. Holter monitor in February of 2014 showed sinus rhythm, mobitz 1; toprol DCed; since I last saw him, the patient denies any dyspnea on exertion, orthopnea, PND, pedal edema, palpitations, syncope or chest pain.    Current Outpatient Prescriptions  Medication Sig Dispense Refill  . acetaminophen (TYLENOL) 325 MG tablet Take 650 mg by mouth every 6 (six) hours as needed for pain.      [name] apixaban (ELIQUIS) 5 MG TABS tablet Take 1 tablet (5 mg total) by mouth 2 (two) times daily.  180 tablet  3  . hydrochlorothiazide (HYDRODIURIL) 25 MG tablet Take 0.5 tablets (12.5 mg total) by mouth daily.      [name] lisinopril (PRINIVIL,ZESTRIL) 5 MG tablet Take 1 tablet (5 mg total) by mouth daily.  60 tablet  12   No current facility-administered medications for this visit.     Past Medical History  Diagnosis Date  . Hypertension   . PUD (peptic ulcer disease)     Remote, history of  . BPH (benign prostatic hyperplasia)   . Urinary hesitancy   . Midsternal chest pain     a. 09/2008 Myoview: EF 56%, mild fixed thinning of inf wall particularly @ base -? attenuation.  No ischemia  . Dyspnea on exertion     a. 09/2008 NL EF on myoview.  [name] Hearing difficulty     R sided hole in eardrum, only wears L hearing aid  . Lumbar stenosis with neurogenic claudication     a. 02/2011 s/p decompressive laminectomy.  [name] History of tobacco abuse   . Atrial fibrillation     Past Surgical History  Procedure Laterality Date  . Tympanomastoidectomy      Left modified radical with type 3  tympanoplasty  . Hernia repair      Left  . Lumbar laminectomy/decompression microdiscectomy  03/04/2011    Procedure: LUMBAR LAMINECTOMY/DECOMPRESSION MICRODISCECTOMY;  Surgeon: Cristi [name];  Location: [facility];  Service: Neurosurgery;  Laterality: N/A;  LUMBAR THREE-FOUR ,FOUR-FIVE LAMINECTOMIES    History   Social History  . Marital Status: Married    Spouse Name: N/A    Number of Children: 2  . Years of Education: N/A   Occupational History  . [employer]     Retired   Social History Main Topics  . Smoking status: Former Games developer  . Smokeless tobacco: Never Used     Comment: Remote history of smoking  . Alcohol Use: No  . Drug Use: No  . Sexually Active: Not on file   Other Topics Concern  . Not on file   Social History Narrative   Lives with his wife in [place].   He swims daily.   He has 2 grown daughters.   1 Caffeine drink daily     ROS: no fevers or chills, productive cough, hemoptysis, dysphasia, odynophagia, melena, hematochezia, dysuria, hematuria, rash, seizure activity, orthopnea, PND, pedal edema, claudication. Remaining systems are negative.  Physical Exam: Well-developed well-nourished in no acute distress.  Skin is warm and dry.  HEENT is normal.  Neck is supple.  Chest is clear to auscultation with normal  expansion.  Cardiovascular exam is regular rate and rhythm.  Abdominal exam nontender or distended. No masses palpated. Extremities show no edema. neuro grossly intact  ECG sinus rhythm with first degree AV block and PACs. Nonspecific ST changes.

## 2012-07-21 NOTE — Assessment & Plan Note (Signed)
Patient is in sinus rhythm today. Toprol was discontinued because of bradycardia. Multiple embolic risk factors. Continue Apixiban.

## 2012-07-21 NOTE — Assessment & Plan Note (Signed)
Management per primary care. 

## 2012-07-28 ENCOUNTER — Other Ambulatory Visit (INDEPENDENT_AMBULATORY_CARE_PROVIDER_SITE_OTHER): Payer: Medicare Other

## 2012-07-28 DIAGNOSIS — I4891 Unspecified atrial fibrillation: Secondary | ICD-10-CM | POA: Diagnosis not present

## 2012-07-28 LAB — BASIC METABOLIC PANEL
GFR: 54.61 mL/min — ABNORMAL LOW (ref >60.00)
Potassium: 4.6 mEq/L (ref 3.5–5.1)
Sodium: 136 mEq/L (ref 135–145)

## 2012-09-15 ENCOUNTER — Ambulatory Visit (INDEPENDENT_AMBULATORY_CARE_PROVIDER_SITE_OTHER): Payer: Medicare Other | Admitting: Cardiology

## 2012-09-15 ENCOUNTER — Encounter: Payer: Self-pay | Admitting: Cardiology

## 2012-09-15 VITALS — BP 182/78 | HR 64 | Wt 184.0 lb

## 2012-09-15 DIAGNOSIS — I4891 Unspecified atrial fibrillation: Secondary | ICD-10-CM

## 2012-09-15 DIAGNOSIS — I10 Essential (primary) hypertension: Secondary | ICD-10-CM

## 2012-09-15 DIAGNOSIS — I420 Dilated cardiomyopathy: Secondary | ICD-10-CM

## 2012-09-15 DIAGNOSIS — I428 Other cardiomyopathies: Secondary | ICD-10-CM | POA: Diagnosis not present

## 2012-09-15 DIAGNOSIS — E7800 Pure hypercholesterolemia, unspecified: Secondary | ICD-10-CM

## 2012-09-15 LAB — BASIC METABOLIC PANEL
CO2: 25 mEq/L (ref 19–32)
Calcium: 9.2 mg/dL (ref 8.4–10.5)
Chloride: 102 mEq/L (ref 96–112)
Potassium: 4.9 mEq/L (ref 3.5–5.1)
Sodium: 136 mEq/L (ref 135–145)

## 2012-09-15 LAB — CBC WITH DIFFERENTIAL/PLATELET
Basophils Relative: 0.7 % (ref 0.0–3.0)
Eosinophils Absolute: 0.1 10*3/uL (ref 0.0–0.7)
Hemoglobin: 13.2 g/dL (ref 13.0–17.0)
Lymphocytes Relative: 37.8 % (ref 12.0–46.0)
MCHC: 34.7 g/dL (ref 30.0–36.0)
MCV: 90.6 fl (ref 78.0–100.0)
Neutro Abs: 2.6 10*3/uL (ref 1.4–7.7)
RBC: 4.2 Mil/uL — ABNORMAL LOW (ref 4.22–5.81)

## 2012-09-15 MED ORDER — LISINOPRIL 20 MG PO TABS: 20.0000 mg | ORAL_TABLET | Freq: Every day | ORAL | Status: DC

## 2012-09-15 NOTE — Assessment & Plan Note (Signed)
Patient's blood pressure is elevated today. This may be white coat syndrome. He does check his blood pressure at home and it is typically controlled. I will ask him to bring his cuff to the office to correlate with ours. If it correlates well we will continue with his present medications.

## 2012-09-15 NOTE — Assessment & Plan Note (Signed)
Management per primary care. 

## 2012-09-15 NOTE — Assessment & Plan Note (Signed)
Patient remains in sinus rhythm on examination. Previous bradycardia with beta-blockade. Will not resume. Continue apixiban. Check hemoglobin and renal function.

## 2012-09-15 NOTE — Patient Instructions (Addendum)
Your physician wants you to follow-up in: 6 MONTHS WITH DR Jens Som You will receive a reminder letter in the mail two months in advance. If you don't receive a letter, please call our office to schedule the follow-up appointment.   Your physician has requested that you have an echocardiogram. Echocardiography is a painless test that uses sound waves to create images of your heart. It provides your doctor with information about the size and shape of your heart and how well your heart's chambers and valves are working. This procedure takes approximately one hour. There are no restrictions for this procedure. SCHEDULE IN JUNE    BRING BLOOD PRESSURE CUFF TO THE OFFICE FOR [place] TO CHECK  Your physician recommends that you HAVE LAB WORK TODAY

## 2012-09-15 NOTE — Assessment & Plan Note (Signed)
LV function reduced on previous echo. Etiology unclear. Continue ACE inhibitor. Patient had bradycardia with beta-blockade. Question if hypertension contributed to his reduced LV function. Plan to repeat echo in June to see if it has improved. If not we may consider repeating his nuclear study to screen for ischemia.

## 2012-09-15 NOTE — Progress Notes (Signed)
HPI: Pleasant male for followup of atrial fibrillation. Myoview in June of 2013 showed diaphragmatic attenuation but no ischemia. Ejection fraction was 51%. Monitor for palpitations in Dec 2013 showed sinus with PVCs, afib/flutter. Echocardiogram in January of 2014 showed an ejection fraction of 30-35%. There was mild aortic and mitral regurgitation. TSH in January 2014 0.52. We have elected rate control and anticoagulation. Holter monitor in February of 2014 showed sinus rhythm, mobitz 1; toprol DCed. Since I last saw him in April 2014, the patient denies any dyspnea on exertion, orthopnea, PND, pedal edema, palpitations, syncope or chest pain.   Current Outpatient Prescriptions  Medication Sig Dispense Refill  . acetaminophen (TYLENOL) 325 MG tablet Take 650 mg by mouth every 6 (six) hours as needed for pain.      [name] apixaban (ELIQUIS) 5 MG TABS tablet Take 1 tablet (5 mg total) by mouth 2 (two) times daily.  180 tablet  3  . hydrochlorothiazide (HYDRODIURIL) 25 MG tablet Take 0.5 tablets (12.5 mg total) by mouth daily.      [name] lisinopril (PRINIVIL,ZESTRIL) 20 MG tablet Take 1 tablet (20 mg total) by mouth daily.  60 tablet  12   No current facility-administered medications for this visit.     Past Medical History  Diagnosis Date  . Hypertension   . PUD (peptic ulcer disease)     Remote, history of  . BPH (benign prostatic hyperplasia)   . Urinary hesitancy   . Midsternal chest pain     a. 09/2008 Myoview: EF 56%, mild fixed thinning of inf wall particularly @ base -? attenuation.  No ischemia  . Dyspnea on exertion     a. 09/2008 NL EF on myoview.  [name] Hearing difficulty     R sided hole in eardrum, only wears L hearing aid  . Lumbar stenosis with neurogenic claudication     a. 02/2011 s/p decompressive laminectomy.  [name] History of tobacco abuse   . Atrial fibrillation     Past Surgical History  Procedure Laterality Date  . Tympanomastoidectomy      Left modified radical with type  3 tympanoplasty  . Hernia repair      Left  . Lumbar laminectomy/decompression microdiscectomy  03/04/2011    Procedure: LUMBAR LAMINECTOMY/DECOMPRESSION MICRODISCECTOMY;  Surgeon: Cristi [name];  Location: [facility];  Service: Neurosurgery;  Laterality: N/A;  LUMBAR THREE-FOUR ,FOUR-FIVE LAMINECTOMIES    History   Social History  . Marital Status: Married    Spouse Name: N/A    Number of Children: 2  . Years of Education: N/A   Occupational History  . [employer]     Retired   Social History Main Topics  . Smoking status: Former Games developer  . Smokeless tobacco: Never Used     Comment: Remote history of smoking  . Alcohol Use: No  . Drug Use: No  . Sexually Active: Not on file   Other Topics Concern  . Not on file   Social History Narrative   Lives with his wife in [place].   He swims daily.   He has 2 grown daughters.   1 Caffeine drink daily     ROS: no fevers or chills, productive cough, hemoptysis, dysphasia, odynophagia, melena, hematochezia, dysuria, hematuria, rash, seizure activity, orthopnea, PND, pedal edema, claudication. Remaining systems are negative.  Physical Exam: Well-developed well-nourished in no acute distress.  Skin is warm and dry.  HEENT is normal.  Neck is supple.  Chest is clear to auscultation  with normal expansion.  Cardiovascular exam is regular rate and rhythm.  Abdominal exam nontender or distended. No masses palpated. Extremities show no edema. neuro grossly intact

## 2012-09-29 ENCOUNTER — Ambulatory Visit (HOSPITAL_COMMUNITY): Payer: Medicare Other | Attending: Cardiology

## 2012-09-29 DIAGNOSIS — E7800 Pure hypercholesterolemia, unspecified: Secondary | ICD-10-CM | POA: Diagnosis not present

## 2012-09-29 DIAGNOSIS — R002 Palpitations: Secondary | ICD-10-CM | POA: Insufficient documentation

## 2012-09-29 DIAGNOSIS — I4891 Unspecified atrial fibrillation: Secondary | ICD-10-CM

## 2012-09-29 DIAGNOSIS — I420 Dilated cardiomyopathy: Secondary | ICD-10-CM

## 2012-09-29 DIAGNOSIS — I428 Other cardiomyopathies: Secondary | ICD-10-CM | POA: Insufficient documentation

## 2012-09-29 DIAGNOSIS — I10 Essential (primary) hypertension: Secondary | ICD-10-CM | POA: Insufficient documentation

## 2012-09-29 NOTE — Progress Notes (Signed)
Echocardiogram performed.  

## 2012-10-10 ENCOUNTER — Ambulatory Visit (INDEPENDENT_AMBULATORY_CARE_PROVIDER_SITE_OTHER)
Admission: RE | Admit: 2012-10-10 | Discharge: 2012-10-10 | Disposition: A | Discharge status: Home / Self Care | Payer: Medicare Other | Source: Ambulatory Visit | Attending: Cardiology | Admitting: Cardiology

## 2012-10-10 ENCOUNTER — Other Ambulatory Visit: Payer: Self-pay | Admitting: *Deleted

## 2012-10-10 DIAGNOSIS — R0602 Shortness of breath: Secondary | ICD-10-CM

## 2012-11-23 DIAGNOSIS — L570 Actinic keratosis: Secondary | ICD-10-CM | POA: Diagnosis not present

## 2012-11-23 DIAGNOSIS — C44611 Basal cell carcinoma of skin of unspecified upper limb, including shoulder: Secondary | ICD-10-CM | POA: Diagnosis not present

## 2013-01-12 DIAGNOSIS — H7200 Central perforation of tympanic membrane, unspecified ear: Secondary | ICD-10-CM | POA: Diagnosis not present

## 2013-01-12 DIAGNOSIS — J069 Acute upper respiratory infection, unspecified: Secondary | ICD-10-CM | POA: Diagnosis not present

## 2013-01-16 DIAGNOSIS — R059 Cough, unspecified: Secondary | ICD-10-CM | POA: Diagnosis not present

## 2013-01-16 DIAGNOSIS — H6120 Impacted cerumen, unspecified ear: Secondary | ICD-10-CM | POA: Diagnosis not present

## 2013-01-16 DIAGNOSIS — J019 Acute sinusitis, unspecified: Secondary | ICD-10-CM | POA: Diagnosis not present

## 2013-01-16 DIAGNOSIS — R05 Cough: Secondary | ICD-10-CM | POA: Diagnosis not present

## 2013-02-08 DIAGNOSIS — Z23 Encounter for immunization: Secondary | ICD-10-CM | POA: Diagnosis not present

## 2013-02-20 DIAGNOSIS — N400 Enlarged prostate without lower urinary tract symptoms: Secondary | ICD-10-CM | POA: Diagnosis not present

## 2013-02-20 DIAGNOSIS — R32 Unspecified urinary incontinence: Secondary | ICD-10-CM | POA: Diagnosis not present

## 2013-03-01 ENCOUNTER — Encounter (INDEPENDENT_AMBULATORY_CARE_PROVIDER_SITE_OTHER): Payer: Medicare Other | Admitting: Ophthalmology

## 2013-03-01 DIAGNOSIS — H35039 Hypertensive retinopathy, unspecified eye: Secondary | ICD-10-CM | POA: Diagnosis not present

## 2013-03-01 DIAGNOSIS — H3530 Unspecified macular degeneration: Secondary | ICD-10-CM

## 2013-03-01 DIAGNOSIS — I10 Essential (primary) hypertension: Secondary | ICD-10-CM

## 2013-03-01 DIAGNOSIS — H43819 Vitreous degeneration, unspecified eye: Secondary | ICD-10-CM

## 2013-03-01 DIAGNOSIS — H35439 Paving stone degeneration of retina, unspecified eye: Secondary | ICD-10-CM

## 2013-03-07 DIAGNOSIS — N400 Enlarged prostate without lower urinary tract symptoms: Secondary | ICD-10-CM | POA: Diagnosis not present

## 2013-03-07 DIAGNOSIS — R32 Unspecified urinary incontinence: Secondary | ICD-10-CM | POA: Diagnosis not present

## 2013-03-20 ENCOUNTER — Encounter: Payer: Self-pay | Admitting: Cardiology

## 2013-03-20 ENCOUNTER — Ambulatory Visit (INDEPENDENT_AMBULATORY_CARE_PROVIDER_SITE_OTHER): Payer: Medicare Other | Admitting: Cardiology

## 2013-03-20 VITALS — BP 158/80 | HR 57 | Ht 71.0 in | Wt 189.4 lb

## 2013-03-20 DIAGNOSIS — I4891 Unspecified atrial fibrillation: Secondary | ICD-10-CM

## 2013-03-20 NOTE — Patient Instructions (Signed)
Your physician wants you to follow-up in:  6 months. You will receive a reminder letter in the mail two months in advance. If you don't receive a letter, please call our office to schedule the follow-up appointment.   

## 2013-03-20 NOTE — Assessment & Plan Note (Signed)
Ejection fraction 40-45% on most recent echo. Continue ACE inhibitor. Avoid beta blocker given history of bradycardia and Mobitz 1.

## 2013-03-20 NOTE — Assessment & Plan Note (Signed)
Patient remains in sinus rhythm. Continue apixaban. Review of laboratories from the [place] in September of 2014 showed normal hemoglobin and creatinine 1.3. He is not on any AV nodal blocking agents as he has had bradycardia in the past.

## 2013-03-20 NOTE — Progress Notes (Signed)
HPI: FU atrial fibrillation. Myoview in June of 2013 showed diaphragmatic attenuation but no ischemia. Ejection fraction was 51%. Monitor for palpitations in Dec 2013 showed sinus with PVCs, afib/flutter. TSH in January 2014 0.52. We have elected rate control and anticoagulation. Holter monitor in February of 2014 showed sinus rhythm, mobitz 1; toprol DCed. Echocardiogram in June of 2014 showed an ejection fraction of 40-45%. There was mild aortic and mitral regurgitation. There was mild left atrial enlargement. There was a pleural effusion. Since I last saw him in June 2014, the patient has dyspnea with more extreme activities but not with routine activities. It is relieved with rest. It is not associated with chest pain. There is no orthopnea, PND or pedal edema. There is no syncope or palpitations. There is no exertional chest pain.    Current Outpatient Prescriptions  Medication Sig Dispense Refill  . acetaminophen (TYLENOL) 325 MG tablet Take 650 mg by mouth every 6 (six) hours as needed for pain.      [name] apixaban (ELIQUIS) 5 MG TABS tablet Take 1 tablet (5 mg total) by mouth 2 (two) times daily.  180 tablet  3  . hydrochlorothiazide (HYDRODIURIL) 25 MG tablet Take 0.5 tablets (12.5 mg total) by mouth daily.      [name] lisinopril (PRINIVIL,ZESTRIL) 20 MG tablet Take 1 tablet (20 mg total) by mouth daily.  60 tablet  12   No current facility-administered medications for this visit.     Past Medical History  Diagnosis Date  . Hypertension   . PUD (peptic ulcer disease)     Remote, history of  . BPH (benign prostatic hyperplasia)   . Urinary hesitancy   . Midsternal chest pain     a. 09/2008 Myoview: EF 56%, mild fixed thinning of inf wall particularly @ base -? attenuation.  No ischemia  . Dyspnea on exertion     a. 09/2008 NL EF on myoview.  [name] Hearing difficulty     R sided hole in eardrum, only wears L hearing aid  . Lumbar stenosis with neurogenic claudication     a. 02/2011 s/p  decompressive laminectomy.  [name] History of tobacco abuse   . Atrial fibrillation     Past Surgical History  Procedure Laterality Date  . Tympanomastoidectomy      Left modified radical with type 3 tympanoplasty  . Hernia repair      Left  . Lumbar laminectomy/decompression microdiscectomy  03/04/2011    Procedure: LUMBAR LAMINECTOMY/DECOMPRESSION MICRODISCECTOMY;  Surgeon: Cristi [name];  Location: [facility];  Service: Neurosurgery;  Laterality: N/A;  LUMBAR THREE-FOUR ,FOUR-FIVE LAMINECTOMIES    History   Social History  . Marital Status: Married    Spouse Name: N/A    Number of Children: 2  . Years of Education: N/A   Occupational History  . [employer]     Retired   Social History Main Topics  . Smoking status: Former Games developer  . Smokeless tobacco: Never Used     Comment: Remote history of smoking  . Alcohol Use: No  . Drug Use: No  . Sexual Activity: Not on file   Other Topics Concern  . Not on file   Social History Narrative   Lives with his wife in [place].   He swims daily.   He has 2 grown daughters.   1 Caffeine drink daily     ROS: no fevers or chills, productive cough, hemoptysis, dysphasia, odynophagia, melena, hematochezia, dysuria, hematuria, rash,  seizure activity, orthopnea, PND, pedal edema, claudication. Remaining systems are negative.  Physical Exam: Well-developed well-nourished in no acute distress.  Skin is warm and dry.  HEENT is normal.  Neck is supple.  Chest is clear to auscultation with normal expansion.  Cardiovascular exam is regular rate and rhythm.  Abdominal exam nontender or distended. No masses palpated. Extremities show no edema. neuro grossly intact  ECG sinus bradycardia with first degree AV block. Nonspecific ST changes.

## 2013-03-20 NOTE — Assessment & Plan Note (Signed)
Blood pressure is mildly elevated he follows this at home and it is typically controlled.

## 2013-03-27 DIAGNOSIS — H04129 Dry eye syndrome of unspecified lacrimal gland: Secondary | ICD-10-CM | POA: Diagnosis not present

## 2013-03-27 DIAGNOSIS — H353190 Nonexudative age-related macular degeneration, unspecified eye, stage unspecified: Secondary | ICD-10-CM | POA: Diagnosis not present

## 2013-03-29 DIAGNOSIS — D235 Other benign neoplasm of skin of trunk: Secondary | ICD-10-CM | POA: Diagnosis not present

## 2013-03-29 DIAGNOSIS — C44611 Basal cell carcinoma of skin of unspecified upper limb, including shoulder: Secondary | ICD-10-CM | POA: Diagnosis not present

## 2013-03-29 DIAGNOSIS — L570 Actinic keratosis: Secondary | ICD-10-CM | POA: Diagnosis not present

## 2013-03-29 DIAGNOSIS — L821 Other seborrheic keratosis: Secondary | ICD-10-CM | POA: Diagnosis not present

## 2013-04-06 ENCOUNTER — Telehealth: Payer: Self-pay | Admitting: Cardiology

## 2013-04-06 NOTE — Telephone Encounter (Signed)
Patient states he was seen in appt 12/1. Continues with intermittent mild CP with occasional SOB. Most recent episodes started 12/13, accompanied with "weakness, short-winded and a little bit dizzy". Denies cough or LE swelling. States most recent episode from 12/13 lasted through last night. Currently without current CP or SOB. States BP within his "usual range 125-130 systolically".  States he does not feel like he should go to ED but he does want to be seen by Dr. Jens Som. Scheduled first available with Dr. Jens Som (May 11, 2013 @11 :00 am). Patient advised that should he have another episode of CP, dizziness, SOB that he should call 911 and be seen emergently. Also, offered patient opportunity to see extended care provider should he feel he needs to be seen prior to January 22nd appt with Dr. Jens Som. Will also route message to Dr. Jens Som to seek advisement regarding if patient needs to be seen sooner than January 22nd.

## 2013-04-06 NOTE — Telephone Encounter (Signed)
New problem:  Pt states he had pain on his left side yesterday and last night. Pt states he is SOB and would like to speak to a nurse.

## 2013-04-06 NOTE — Telephone Encounter (Signed)
Would have pt seen by PA today or tomorrow if possible Ralph Mendoza

## 2013-04-06 NOTE — Telephone Encounter (Signed)
Dr. Jens Som advises patient to be seen this week. Patient scheduled to see Dr. Patty Sermons tomorrow, 12/19 @0900 . Patient instructed to bring his medications/medication list in with him so that Dr. Patty Sermons can review with him.  Appointment for 05/11/13 cancelled. Patient verbalized appreciation for the call back and rescheduled appointment time for this week.

## 2013-04-07 ENCOUNTER — Ambulatory Visit (INDEPENDENT_AMBULATORY_CARE_PROVIDER_SITE_OTHER): Payer: Medicare Other | Admitting: Cardiology

## 2013-04-07 ENCOUNTER — Encounter: Payer: Self-pay | Admitting: Cardiology

## 2013-04-07 VITALS — BP 138/78 | HR 72 | Ht 70.0 in | Wt 181.0 lb

## 2013-04-07 DIAGNOSIS — R55 Syncope and collapse: Secondary | ICD-10-CM

## 2013-04-07 DIAGNOSIS — I420 Dilated cardiomyopathy: Secondary | ICD-10-CM

## 2013-04-07 DIAGNOSIS — I428 Other cardiomyopathies: Secondary | ICD-10-CM

## 2013-04-07 DIAGNOSIS — R634 Abnormal weight loss: Secondary | ICD-10-CM | POA: Diagnosis not present

## 2013-04-07 DIAGNOSIS — R0602 Shortness of breath: Secondary | ICD-10-CM | POA: Diagnosis not present

## 2013-04-07 DIAGNOSIS — I10 Essential (primary) hypertension: Secondary | ICD-10-CM | POA: Diagnosis not present

## 2013-04-07 DIAGNOSIS — I4891 Unspecified atrial fibrillation: Secondary | ICD-10-CM

## 2013-04-07 NOTE — Patient Instructions (Signed)
STOP THE RAPAFLO  Your physician recommends that you schedule a follow-up appointment in: 6 WEEKS WITH DR Jens Som  Your physician has recommended that you wear an event monitor. Event monitors are medical devices that record the heart's electrical activity. Doctors most often [place] these monitors to diagnose arrhythmias. Arrhythmias are problems with the speed or rhythm of the heartbeat. The monitor is a small, portable device. You can wear one while you do your normal daily activities. This is usually used to diagnose what is causing palpitations/syncope (passing out). 30 DAY

## 2013-04-07 NOTE — Assessment & Plan Note (Signed)
Blood pressure was very high when he arrived.  I rechecked it myself manually and blood pressure was 138/78.

## 2013-04-07 NOTE — Assessment & Plan Note (Signed)
The cause of his episodes of near syncope and lightheadedness is not clear.  It may be from his Rapaflo which can cause dizziness and syncope.  He does not think that the drug has made much difference in his symptoms so we will stop it now.  It may also be that his dizziness and syncope maybe occurring at a time that he goes from atrial fibrillation back to normal sinus rhythm.  He may be having post arrhythmia pauses.  We will have him wear a 30 day event monitor to evaluate whether these symptoms are related to arrhythmia or not.

## 2013-04-07 NOTE — Assessment & Plan Note (Signed)
Examination reveals that he appears to be euvolemic.  Lungs are clear and there was no peripheral edema

## 2013-04-07 NOTE — Progress Notes (Signed)
Ralph Mendoza Date of Birth:  [birth year] [address] [place], [place]  [address] [phone]         Fax   [phone]  History of Present Illness: This pleasant 79 year old gentleman is seen as a work in for office DOD visit.  He comes in because of concern over feelings of lightheadedness and near-syncope.  This has been going on for several weeks.  He has been more short of breath.  He feels like he is hyperventilating at times.  He has lost 8 pounds non- intentionally since last seen here on 03/20/13 by Dr. Jens Som.  He feels that he is eating about the same as usual. He has had some mild substernal chest discomfort.  He has been taking Rapaflo 8 mg from his urologist for the past several weeks.  It was not on his med list sheet today.  His recent symptoms seem to have been present since starting the rapaflo but may have preceded it. The patient does not have any history of ischemic heart disease.  He had a Myoview stress test on 09/29/11 which showed no ischemia and his ejection fraction was 51%.  He had a chest x-ray 6 months ago which showed normal heart size and clear lung fields. He has a history of paroxysmal atrial fibrillation.  When he was seen on 03/20/13 he was in normal sinus rhythm.  Today he returns and he is in atrial fibrillation with a controlled ventricular response of 71.  There are no ischemic changes on his EKG today  Current Outpatient Prescriptions  Medication Sig Dispense Refill  . acetaminophen (TYLENOL) 325 MG tablet Take 650 mg by mouth every 6 (six) hours as needed for pain.      [name] apixaban (ELIQUIS) 5 MG TABS tablet Take 1 tablet (5 mg total) by mouth 2 (two) times daily.  180 tablet  3  . hydrochlorothiazide (HYDRODIURIL) 25 MG tablet Take 0.5 tablets (12.5 mg total) by mouth daily.      [name] lisinopril (PRINIVIL,ZESTRIL) 20 MG tablet Take 1 tablet (20 mg total) by mouth daily.  60 tablet  12   No current facility-administered medications for  this visit.    No Known Allergies  Patient Active Problem List   Diagnosis Date Noted  . Near syncope 04/07/2013  . Congestive dilated cardiomyopathy 06/09/2012  . Atrial fibrillation 05/17/2012  . Midsternal chest pain 09/21/2011  . Lumbar stenosis with neurogenic claudication 03/04/2011  . HYPERCHOLESTEROLEMIA-PURE 09/11/2008  . DYSPNEA 09/11/2008  . ABDOMINAL PAIN RIGHT LOWER QUADRANT 09/11/2008  . HYPERTENSION 09/10/2008    History  Smoking status  . Former Smoker  Smokeless tobacco  . Never Used    Comment: Remote history of smoking    History  Alcohol Use No    Family History  Problem Relation Age of Onset  . Lung cancer Mother   . Appendicitis Father   . Diabetes Sister     DM  . Coronary artery disease Neg Hx     No premature CAD  . Colon cancer Neg Hx     Review of Systems: Constitutional: no fever chills diaphoresis or fatigue or change in weight.  Head and neck: no hearing loss, no epistaxis, no photophobia or visual disturbance. Respiratory: No cough, shortness of breath or wheezing. Cardiovascular: No chest pain peripheral edema, palpitations. Gastrointestinal: No abdominal distention, no abdominal pain, no change in bowel habits hematochezia or melena. Genitourinary: No dysuria, no frequency, no urgency, no nocturia.  Musculoskeletal:No arthralgias, no back pain, no gait disturbance or myalgias. Neurological: No dizziness, no headaches, no numbness, no seizures, no syncope, no weakness, no tremors. Hematologic: No lymphadenopathy, no easy bruising. Psychiatric: No confusion, no hallucinations, no sleep disturbance.    Physical Exam: Filed Vitals:   04/07/13 0958  BP: 138/78  Pulse:  71   The general appearance reveals an alert elderly gentleman who appears younger than his stated age.  He is somewhat anxious.  He is in no acute distress.The head and neck exam reveals pupils equal and reactive.  Extraocular movements are full.  There is no  scleral icterus.  The mouth and pharynx are normal.  The neck is supple.  The carotids reveal no bruits.  The jugular venous pressure is normal.  The  thyroid is not enlarged.  There is no lymphadenopathy.  The chest is clear to percussion and auscultation.  There are no rales or rhonchi.  Expansion of the chest is symmetrical.  The pulse is irregularly irregular.  The precordium is quiet.  The first heart sound is normal.  The second heart sound is physiologically split.  There is no murmur gallop rub or click.  There is no abnormal lift or heave.  The abdomen is soft and nontender.  The bowel sounds are normal.  The liver and spleen are not enlarged.  There are no abdominal masses.  There are no abdominal bruits.  Extremities reveal good pedal pulses.  There is no phlebitis or edema.  There is no cyanosis or clubbing.  Strength is normal and symmetrical in all extremities.  There is no lateralizing weakness.  There are no sensory deficits.  The skin is warm and dry.  There is no rash.  EKG shows atrial fibrillation new since 03/20/13.  No ischemic changes   Assessment / Plan:  Episodes of dizziness and near-syncope of uncertain etiology.  These may be secondary side effects from his rapaflo.  We will stop this drug now and observe.  He may also be having tachybradycardia symptoms with post conversion pauses from his atrial fibrillation.  We will have him wear a 30 day event monitor. He has had an rapid weight loss of 8 pounds since 03/20/13.  No real change in appetite etc. continue to follow We will have him return in 6 weeks for followup with Dr. Jens Som

## 2013-04-07 NOTE — Assessment & Plan Note (Signed)
EKG shows recurrence of atrial fibrillation.  He is already on anticoagulation.

## 2013-04-11 ENCOUNTER — Encounter (INDEPENDENT_AMBULATORY_CARE_PROVIDER_SITE_OTHER): Payer: Medicare Other

## 2013-04-11 ENCOUNTER — Encounter: Payer: Self-pay | Admitting: *Deleted

## 2013-04-11 DIAGNOSIS — I4891 Unspecified atrial fibrillation: Secondary | ICD-10-CM

## 2013-04-11 DIAGNOSIS — R55 Syncope and collapse: Secondary | ICD-10-CM

## 2013-04-11 NOTE — Progress Notes (Signed)
Patient ID: Ralph Mendoza, male   DOB: 07-27-1920, 79 y.o.   MRN: [id number] E-Cardio verite 30 day cardiac event monitor applied to patient.

## 2013-05-11 ENCOUNTER — Ambulatory Visit: Payer: Medicare Other | Admitting: Cardiology

## 2013-05-19 ENCOUNTER — Encounter: Payer: Self-pay | Admitting: Cardiology

## 2013-05-19 ENCOUNTER — Ambulatory Visit (INDEPENDENT_AMBULATORY_CARE_PROVIDER_SITE_OTHER): Payer: Medicare Other | Admitting: Cardiology

## 2013-05-19 VITALS — BP 144/78 | HR 72 | Ht 70.5 in | Wt 187.0 lb

## 2013-05-19 DIAGNOSIS — I428 Other cardiomyopathies: Secondary | ICD-10-CM | POA: Diagnosis not present

## 2013-05-19 DIAGNOSIS — I4891 Unspecified atrial fibrillation: Secondary | ICD-10-CM | POA: Diagnosis not present

## 2013-05-19 DIAGNOSIS — I420 Dilated cardiomyopathy: Secondary | ICD-10-CM

## 2013-05-19 DIAGNOSIS — I10 Essential (primary) hypertension: Secondary | ICD-10-CM | POA: Diagnosis not present

## 2013-05-19 LAB — CBC WITH DIFFERENTIAL/PLATELET
BASOS ABS: 0 10*3/uL (ref 0.0–0.1)
Basophils Relative: 0.8 % (ref 0.0–3.0)
Eosinophils Absolute: 0.1 10*3/uL (ref 0.0–0.7)
Eosinophils Relative: 2.1 % (ref 0.0–5.0)
HCT: 37.4 % — ABNORMAL LOW (ref 39.0–52.0)
Hemoglobin: 12.4 g/dL — ABNORMAL LOW (ref 13.0–17.0)
LYMPHS PCT: 36 % (ref 12.0–46.0)
Lymphs Abs: 1.8 10*3/uL (ref 0.7–4.0)
MCHC: 33.1 g/dL (ref 30.0–36.0)
MCV: 92.5 fl (ref 78.0–100.0)
Monocytes Absolute: 0.6 10*3/uL (ref 0.1–1.0)
Monocytes Relative: 11.7 % (ref 3.0–12.0)
Neutro Abs: 2.4 10*3/uL (ref 1.4–7.7)
Neutrophils Relative %: 49.4 % (ref 43.0–77.0)
Platelets: 168 10*3/uL (ref 150.0–400.0)
RBC: 4.05 Mil/uL — ABNORMAL LOW (ref 4.22–5.81)
RDW: 14.2 % (ref 11.5–14.6)
WBC: 4.9 10*3/uL (ref 4.5–10.5)

## 2013-05-19 LAB — BASIC METABOLIC PANEL
BUN: 24 mg/dL — ABNORMAL HIGH (ref 6–23)
CHLORIDE: 101 meq/L (ref 96–112)
CO2: 30 mEq/L (ref 19–32)
CREATININE: 1.2 mg/dL (ref 0.4–1.5)
Calcium: 9.1 mg/dL (ref 8.4–10.5)
GFR: 59.17 mL/min — ABNORMAL LOW (ref >60.00)
Glucose, Bld: 82 mg/dL (ref 70–99)
Potassium: 4.1 mEq/L (ref 3.5–5.1)
Sodium: 135 mEq/L (ref 135–145)

## 2013-05-19 NOTE — Assessment & Plan Note (Signed)
Continue ACE inhibitor.not on a beta blocker because of history of bradycardia.

## 2013-05-19 NOTE — Patient Instructions (Signed)
Your physician recommends that you schedule a follow-up appointment in: 3 MONTHS WITH DR CRENSHAW  

## 2013-05-19 NOTE — Assessment & Plan Note (Signed)
Continue present blood pressure medications. 

## 2013-05-19 NOTE — Assessment & Plan Note (Addendum)
Patient's rate is controlled on no medications. Continue apixaban. He had a recent dizzy spell. Etiology unclear as it lasted 15 minutes. His monitor was unremarkable. If he has more frequent episodes in the future I will consider an implantable loop monitor. Note he did have Mobitz 1 previously and his beta blocker was discontinued. I am therefore concerned about the possibility of bradycardia. Check hemoglobin and renal function.

## 2013-05-19 NOTE — Progress Notes (Signed)
HPI: FU atrial fibrillation. Myoview in June of 2013 showed diaphragmatic attenuation but no ischemia. Ejection fraction was 51%. Monitor for palpitations in Dec 2013 showed sinus with PVCs, afib/flutter. TSH in January 2014 0.52. We have elected rate control and anticoagulation. Holter monitor in February of 2014 showed sinus rhythm, mobitz 1; toprol DCed. Echocardiogram in June of 2014 showed an ejection fraction of 40-45%. There was mild aortic and mitral regurgitation. There was mild left atrial enlargement. There was a pleural effusion. Patient seen in December of 2014 by Dr. Mare Ferrari with dizziness. Monitor showed atrial fibrillation. Since then he denies any further dizzy spells. No chest pain or syncope. No dyspnea.   Current Outpatient Prescriptions  Medication Sig Dispense Refill  . acetaminophen (TYLENOL) 325 MG tablet Take 650 mg by mouth every 6 (six) hours as needed for pain.      [name] apixaban (ELIQUIS) 5 MG TABS tablet Take 1 tablet (5 mg total) by mouth 2 (two) times daily.  180 tablet  3  . hydrochlorothiazide (HYDRODIURIL) 25 MG tablet Take 0.5 tablets (12.5 mg total) by mouth daily.      [name] lisinopril (PRINIVIL,ZESTRIL) 20 MG tablet Take 1 tablet (20 mg total) by mouth daily.  60 tablet  12   No current facility-administered medications for this visit.     Past Medical History  Diagnosis Date  . Hypertension   . PUD (peptic ulcer disease)     Remote, history of  . BPH (benign prostatic hyperplasia)   . Urinary hesitancy   . Midsternal chest pain     a. 09/2008 Myoview: EF 56%, mild fixed thinning of inf wall particularly @ base -? attenuation.  No ischemia  . Dyspnea on exertion     a. 09/2008 NL EF on myoview.  [name] Hearing difficulty     R sided hole in eardrum, only wears L hearing aid  . Lumbar stenosis with neurogenic claudication     a. 02/2011 s/p decompressive laminectomy.  [name] History of tobacco abuse   . Atrial fibrillation     Past Surgical History    Procedure Laterality Date  . Tympanomastoidectomy      Left modified radical with type 3 tympanoplasty  . Hernia repair      Left  . Lumbar laminectomy/decompression microdiscectomy  03/04/2011    Procedure: LUMBAR LAMINECTOMY/DECOMPRESSION MICRODISCECTOMY;  Surgeon: Ophelia Charter;  Location: [facility];  Service: Neurosurgery;  Laterality: N/A;  LUMBAR THREE-FOUR ,FOUR-FIVE LAMINECTOMIES    History   Social History  . Marital Status: Married    Spouse Name: N/A    Number of Children: 2  . Years of Education: N/A   Occupational History  . [employer]     Retired   Social History Main Topics  . Smoking status: Former Research scientist (life sciences)  . Smokeless tobacco: Never Used     Comment: Remote history of smoking  . Alcohol Use: No  . Drug Use: No  . Sexual Activity: Not on file   Other Topics Concern  . Not on file   Social History Narrative   Lives with his wife in [place].   He swims daily.   He has 2 grown daughters.   1 Caffeine drink daily     ROS: no fevers or chills, productive cough, hemoptysis, dysphasia, odynophagia, melena, hematochezia, dysuria, hematuria, rash, seizure activity, orthopnea, PND, pedal edema, claudication. Remaining systems are negative.  Physical Exam: Well-developed well-nourished in no acute distress.  Skin is  warm and dry.  HEENT is normal.  Neck is supple.  Chest is clear to auscultation with normal expansion.  Cardiovascular exam is irregular Abdominal exam nontender or distended. No masses palpated. Extremities show no edema. neuro grossly intact

## 2013-05-22 ENCOUNTER — Ambulatory Visit: Payer: Medicare Other | Admitting: Cardiology

## 2013-06-22 ENCOUNTER — Other Ambulatory Visit: Payer: Self-pay | Admitting: *Deleted

## 2013-06-22 MED ORDER — APIXABAN 5 MG PO TABS: 5.0000 mg | ORAL_TABLET | Freq: Two times a day (BID) | ORAL | Status: DC

## 2013-09-18 ENCOUNTER — Encounter: Payer: Self-pay | Admitting: Cardiology

## 2013-09-18 ENCOUNTER — Ambulatory Visit (INDEPENDENT_AMBULATORY_CARE_PROVIDER_SITE_OTHER): Payer: Medicare Other | Admitting: Cardiology

## 2013-09-18 VITALS — BP 142/70 | HR 57 | Ht 70.5 in | Wt 188.0 lb

## 2013-09-18 DIAGNOSIS — I10 Essential (primary) hypertension: Secondary | ICD-10-CM

## 2013-09-18 DIAGNOSIS — I428 Other cardiomyopathies: Secondary | ICD-10-CM

## 2013-09-18 DIAGNOSIS — I4891 Unspecified atrial fibrillation: Secondary | ICD-10-CM

## 2013-09-18 DIAGNOSIS — I420 Dilated cardiomyopathy: Secondary | ICD-10-CM

## 2013-09-18 LAB — BASIC METABOLIC PANEL
BUN: 20 mg/dL (ref 6–23)
CHLORIDE: 101 meq/L (ref 96–112)
CO2: 28 meq/L (ref 19–32)
Calcium: 9.3 mg/dL (ref 8.4–10.5)
Creatinine, Ser: 1.3 mg/dL (ref 0.4–1.5)
GFR: 56.45 mL/min — AB (ref >60.00)
GLUCOSE: 98 mg/dL (ref 70–99)
POTASSIUM: 4.1 meq/L (ref 3.5–5.1)
Sodium: 136 mEq/L (ref 135–145)

## 2013-09-18 LAB — CBC WITH DIFFERENTIAL/PLATELET
BASOS PCT: 0.6 % (ref 0.0–3.0)
Basophils Absolute: 0 10*3/uL (ref 0.0–0.1)
EOS PCT: 1.7 % (ref 0.0–5.0)
Eosinophils Absolute: 0.1 10*3/uL (ref 0.0–0.7)
HCT: 36.6 % — ABNORMAL LOW (ref 39.0–52.0)
Hemoglobin: 12.4 g/dL — ABNORMAL LOW (ref 13.0–17.0)
Lymphocytes Relative: 35 % (ref 12.0–46.0)
Lymphs Abs: 1.8 10*3/uL (ref 0.7–4.0)
MCHC: 34 g/dL (ref 30.0–36.0)
MCV: 93 fl (ref 78.0–100.0)
MONOS PCT: 11.1 % (ref 3.0–12.0)
Monocytes Absolute: 0.6 10*3/uL (ref 0.1–1.0)
NEUTROS PCT: 51.6 % (ref 43.0–77.0)
Neutro Abs: 2.7 10*3/uL (ref 1.4–7.7)
Platelets: 172 10*3/uL (ref 150.0–400.0)
RBC: 3.94 Mil/uL — AB (ref 4.22–5.81)
RDW: 14.3 % (ref 11.5–15.5)
WBC: 5.2 10*3/uL (ref 4.0–10.5)

## 2013-09-18 NOTE — Patient Instructions (Signed)
Your physician wants you to follow-up in: 6 MONTHS WITH DR CRENSHAW You will receive a reminder letter in the mail two months in advance. If you don't receive a letter, please call our office to schedule the follow-up appointment.   Your physician recommends that you HAVE LAB WORK TODAY 

## 2013-09-18 NOTE — Assessment & Plan Note (Signed)
Continue ACE inhibitor. Not on a beta blocker given history of bradycardia.

## 2013-09-18 NOTE — Assessment & Plan Note (Signed)
Blood pressure controlled. Continue present medications. 

## 2013-09-18 NOTE — Progress Notes (Signed)
HPI: FU atrial fibrillation. Myoview in June of 2013 showed diaphragmatic attenuation but no ischemia. Ejection fraction was 51%. We have elected rate control and anticoagulation for afib. Holter monitor in February of 2014 showed sinus rhythm, mobitz 1; toprol DCed. Echocardiogram in June of 2014 showed an ejection fraction of 40-45%. There was mild aortic and mitral regurgitation. There was mild left atrial enlargement. There was a pleural effusion. Patient seen in December of 2014 by Dr. Mare Ferrari with dizziness. Monitor showed atrial fibrillation. Since last seen in Jan 2015 the patient denies any dyspnea on exertion, orthopnea, PND, pedal edema, palpitations, syncope or chest pain.    Current Outpatient Prescriptions  Medication Sig Dispense Refill  . acetaminophen (TYLENOL) 325 MG tablet Take 650 mg by mouth every 6 (six) hours as needed for pain.      [name] apixaban (ELIQUIS) 5 MG TABS tablet Take 1 tablet (5 mg total) by mouth 2 (two) times daily.  180 tablet  2  . beta carotene w/minerals (OCUVITE) tablet Take 1 tablet by mouth daily.      . hydrochlorothiazide (HYDRODIURIL) 25 MG tablet Take 0.5 tablets (12.5 mg total) by mouth daily.      [name] lisinopril (PRINIVIL,ZESTRIL) 20 MG tablet Take 1 tablet (20 mg total) by mouth daily.  60 tablet  12   No current facility-administered medications for this visit.     Past Medical History  Diagnosis Date  . Hypertension   . PUD (peptic ulcer disease)     Remote, history of  . BPH (benign prostatic hyperplasia)   . Urinary hesitancy   . Midsternal chest pain     a. 09/2008 Myoview: EF 56%, mild fixed thinning of inf wall particularly @ base -? attenuation.  No ischemia  . Dyspnea on exertion     a. 09/2008 NL EF on myoview.  [name] Hearing difficulty     R sided hole in eardrum, only wears L hearing aid  . Lumbar stenosis with neurogenic claudication     a. 02/2011 s/p decompressive laminectomy.  [name] History of tobacco abuse   . Atrial  fibrillation     Past Surgical History  Procedure Laterality Date  . Tympanomastoidectomy      Left modified radical with type 3 tympanoplasty  . Hernia repair      Left  . Lumbar laminectomy/decompression microdiscectomy  03/04/2011    Procedure: LUMBAR LAMINECTOMY/DECOMPRESSION MICRODISCECTOMY;  Surgeon: Ophelia Charter;  Location: [facility];  Service: Neurosurgery;  Laterality: N/A;  LUMBAR THREE-FOUR ,FOUR-FIVE LAMINECTOMIES    History   Social History  . Marital Status: Married    Spouse Name: N/A    Number of Children: 2  . Years of Education: N/A   Occupational History  . [employer]     Retired   Social History Main Topics  . Smoking status: Former Research scientist (life sciences)  . Smokeless tobacco: Never Used     Comment: Remote history of smoking  . Alcohol Use: No  . Drug Use: No  . Sexual Activity: Not on file   Other Topics Concern  . Not on file   Social History Narrative   Lives with his wife in [place].   He swims daily.   He has 2 grown daughters.   1 Caffeine drink daily     ROS: no fevers or chills, productive cough, hemoptysis, dysphasia, odynophagia, melena, hematochezia, dysuria, hematuria, rash, seizure activity, orthopnea, PND, pedal edema, claudication. Remaining systems are negative.  Physical  Exam: Well-developed well-nourished in no acute distress.  Skin is warm and dry.  HEENT is normal.  Neck is supple.  Chest is clear to auscultation with normal expansion.  Cardiovascular exam is regular rate and rhythm.  Abdominal exam nontender or distended. No masses palpated. Extremities show no edema. neuro grossly intact  ECG Sinus rhythm with nonconducted PAC. First degree AV block.

## 2013-09-18 NOTE — Assessment & Plan Note (Addendum)
Patient remains in sinus rhythm. Continue apixaban. Check hemoglobin and renal function. Not on AV nodal blocking agentsGiven history of Mobitz 1.

## 2013-10-05 ENCOUNTER — Other Ambulatory Visit: Payer: Self-pay | Admitting: *Deleted

## 2013-10-05 MED ORDER — LISINOPRIL 20 MG PO TABS: 20.0000 mg | ORAL_TABLET | Freq: Every day | ORAL | Status: DC

## 2013-12-26 DIAGNOSIS — Z85828 Personal history of other malignant neoplasm of skin: Secondary | ICD-10-CM | POA: Diagnosis not present

## 2013-12-26 DIAGNOSIS — L570 Actinic keratosis: Secondary | ICD-10-CM | POA: Diagnosis not present

## 2013-12-26 DIAGNOSIS — C44319 Basal cell carcinoma of skin of other parts of face: Secondary | ICD-10-CM | POA: Diagnosis not present

## 2014-01-10 DIAGNOSIS — H353190 Nonexudative age-related macular degeneration, unspecified eye, stage unspecified: Secondary | ICD-10-CM | POA: Diagnosis not present

## 2014-01-16 DIAGNOSIS — Z23 Encounter for immunization: Secondary | ICD-10-CM | POA: Diagnosis not present

## 2014-02-27 NOTE — Progress Notes (Signed)
HPI: FU atrial fibrillation. Myoview in June of 2013 showed diaphragmatic attenuation but no ischemia. Ejection fraction was 51%. We have elected rate control and anticoagulation for afib. Holter monitor in February of 2014 showed sinus rhythm, mobitz 1; toprol DCed. Echocardiogram in June of 2014 showed an ejection fraction of 40-45%. There was mild aortic and mitral regurgitation. There was mild left atrial enlargement. There was a pleural effusion. Patient seen in December of 2014 by Dr. Mare Ferrari with dizziness. Monitor showed atrial fibrillation. Since last seen, the patient has dyspnea with more extreme activities but not with routine activities. It is relieved with rest. It is not associated with chest pain. There is no orthopnea, PND or pedal edema. There is no syncope or palpitations. There is no exertional chest pain.   Current Outpatient Prescriptions  Medication Sig Dispense Refill  . acetaminophen (TYLENOL) 325 MG tablet Take 650 mg by mouth every 6 (six) hours as needed for pain.    [name] apixaban (ELIQUIS) 5 MG TABS tablet Take 1 tablet (5 mg total) by mouth 2 (two) times daily. 180 tablet 2  . hydrochlorothiazide (HYDRODIURIL) 25 MG tablet Take 0.5 tablets (12.5 mg total) by mouth daily.    [name] lisinopril (PRINIVIL,ZESTRIL) 20 MG tablet Take 1 tablet (20 mg total) by mouth daily. 60 tablet 5   No current facility-administered medications for this visit.     Past Medical History  Diagnosis Date  . Hypertension   . PUD (peptic ulcer disease)     Remote, history of  . BPH (benign prostatic hyperplasia)   . Urinary hesitancy   . Midsternal chest pain     a. 09/2008 Myoview: EF 56%, mild fixed thinning of inf wall particularly @ base -? attenuation.  No ischemia  . Dyspnea on exertion     a. 09/2008 NL EF on myoview.  [name] Hearing difficulty     R sided hole in eardrum, only wears L hearing aid  . Lumbar stenosis with neurogenic claudication     a. 02/2011 s/p decompressive  laminectomy.  [name] History of tobacco abuse   . Atrial fibrillation     Past Surgical History  Procedure Laterality Date  . Tympanomastoidectomy      Left modified radical with type 3 tympanoplasty  . Hernia repair      Left  . Lumbar laminectomy/decompression microdiscectomy  03/04/2011    Procedure: LUMBAR LAMINECTOMY/DECOMPRESSION MICRODISCECTOMY;  Surgeon: Ophelia Charter;  Location: [facility];  Service: Neurosurgery;  Laterality: N/A;  LUMBAR THREE-FOUR ,FOUR-FIVE LAMINECTOMIES    History   Social History  . Marital Status: Married    Spouse Name: N/A    Number of Children: 2  . Years of Education: N/A   Occupational History  . [employer]     Retired   Social History Main Topics  . Smoking status: Former Research scientist (life sciences)  . Smokeless tobacco: Never Used     Comment: Remote history of smoking  . Alcohol Use: No  . Drug Use: No  . Sexual Activity: Not on file   Other Topics Concern  . Not on file   Social History Narrative   Lives with his wife in [place].   He swims daily.   He has 2 grown daughters.   1 Caffeine drink daily     ROS: no fevers or chills, productive cough, hemoptysis, dysphasia, odynophagia, melena, hematochezia, dysuria, hematuria, rash, seizure activity, orthopnea, PND, pedal edema, claudication. Remaining systems are negative.  Physical  Exam: Well-developed well-nourished in no acute distress.  Skin is warm and dry.  HEENT is normal.  Neck is supple.  Chest is clear to auscultation with normal expansion.  Cardiovascular exam is irregular Abdominal exam nontender or distended. No masses palpated. Extremities show no edema. neuro grossly intact  ECG Atrial fibrillation at a rate of 64. No significant ST changes.

## 2014-03-01 ENCOUNTER — Encounter: Payer: Self-pay | Admitting: Cardiology

## 2014-03-01 ENCOUNTER — Ambulatory Visit (INDEPENDENT_AMBULATORY_CARE_PROVIDER_SITE_OTHER): Payer: Medicare Other | Admitting: Cardiology

## 2014-03-01 VITALS — BP 120/62 | HR 64 | Ht 70.0 in | Wt 185.9 lb

## 2014-03-01 DIAGNOSIS — E7800 Pure hypercholesterolemia, unspecified: Secondary | ICD-10-CM

## 2014-03-01 DIAGNOSIS — I480 Paroxysmal atrial fibrillation: Secondary | ICD-10-CM | POA: Diagnosis not present

## 2014-03-01 DIAGNOSIS — I420 Dilated cardiomyopathy: Secondary | ICD-10-CM

## 2014-03-01 DIAGNOSIS — I10 Essential (primary) hypertension: Secondary | ICD-10-CM

## 2014-03-01 NOTE — Patient Instructions (Signed)
Your physician wants you to follow-up in: 6 MONTHS WITH DR CRENSHAW You will receive a reminder letter in the mail two months in advance. If you don't receive a letter, please call our office to schedule the follow-up appointment.  

## 2014-03-01 NOTE — Assessment & Plan Note (Signed)
Patient remains in atrial fibrillation. His rate is controlled on no medications. Continue apixaban. Recent creatinine checked at the [place] and 1.2.

## 2014-03-01 NOTE — Assessment & Plan Note (Signed)
Continue ACE inhibitor.No beta blocker as he has had bradycardia previously.

## 2014-03-01 NOTE — Assessment & Plan Note (Signed)
Blood pressure control. Continue present medications. 

## 2014-03-01 NOTE — Assessment & Plan Note (Signed)
Management per primary care. 

## 2014-03-09 DIAGNOSIS — R3912 Poor urinary stream: Secondary | ICD-10-CM | POA: Diagnosis not present

## 2014-03-09 DIAGNOSIS — N401 Enlarged prostate with lower urinary tract symptoms: Secondary | ICD-10-CM | POA: Diagnosis not present

## 2014-03-13 DIAGNOSIS — H7012 Chronic mastoiditis, left ear: Secondary | ICD-10-CM | POA: Diagnosis not present

## 2014-03-13 DIAGNOSIS — H908 Mixed conductive and sensorineural hearing loss, unspecified: Secondary | ICD-10-CM | POA: Diagnosis not present

## 2014-03-13 DIAGNOSIS — H6121 Impacted cerumen, right ear: Secondary | ICD-10-CM | POA: Diagnosis not present

## 2014-03-13 DIAGNOSIS — H9209 Otalgia, unspecified ear: Secondary | ICD-10-CM | POA: Diagnosis not present

## 2014-03-26 ENCOUNTER — Other Ambulatory Visit: Payer: Self-pay | Admitting: Cardiology

## 2014-03-27 DIAGNOSIS — B351 Tinea unguium: Secondary | ICD-10-CM | POA: Diagnosis not present

## 2014-03-27 DIAGNOSIS — M79675 Pain in left toe(s): Secondary | ICD-10-CM | POA: Diagnosis not present

## 2014-03-27 DIAGNOSIS — M79674 Pain in right toe(s): Secondary | ICD-10-CM | POA: Diagnosis not present

## 2014-03-28 DIAGNOSIS — H908 Mixed conductive and sensorineural hearing loss, unspecified: Secondary | ICD-10-CM | POA: Diagnosis not present

## 2014-03-28 DIAGNOSIS — H7201 Central perforation of tympanic membrane, right ear: Secondary | ICD-10-CM | POA: Diagnosis not present

## 2014-03-28 DIAGNOSIS — Z974 Presence of external hearing-aid: Secondary | ICD-10-CM | POA: Diagnosis not present

## 2014-04-11 DIAGNOSIS — H3531 Nonexudative age-related macular degeneration: Secondary | ICD-10-CM | POA: Diagnosis not present

## 2014-05-21 DIAGNOSIS — K6289 Other specified diseases of anus and rectum: Secondary | ICD-10-CM | POA: Diagnosis not present

## 2014-09-26 DIAGNOSIS — L570 Actinic keratosis: Secondary | ICD-10-CM | POA: Diagnosis not present

## 2014-09-26 DIAGNOSIS — X32XXXD Exposure to sunlight, subsequent encounter: Secondary | ICD-10-CM | POA: Diagnosis not present

## 2014-09-26 DIAGNOSIS — C44219 Basal cell carcinoma of skin of left ear and external auricular canal: Secondary | ICD-10-CM | POA: Diagnosis not present

## 2014-09-26 DIAGNOSIS — Z85828 Personal history of other malignant neoplasm of skin: Secondary | ICD-10-CM | POA: Diagnosis not present

## 2014-09-26 DIAGNOSIS — Z08 Encounter for follow-up examination after completed treatment for malignant neoplasm: Secondary | ICD-10-CM | POA: Diagnosis not present

## 2014-09-28 ENCOUNTER — Other Ambulatory Visit: Payer: Self-pay

## 2014-09-28 ENCOUNTER — Telehealth: Payer: Self-pay | Admitting: Cardiology

## 2014-09-28 MED ORDER — LISINOPRIL 20 MG PO TABS: 20.0000 mg | ORAL_TABLET | Freq: Every day | ORAL | Status: DC

## 2014-09-28 MED ORDER — APIXABAN 5 MG PO TABS: 5.0000 mg | ORAL_TABLET | Freq: Two times a day (BID) | ORAL | Status: DC

## 2014-09-28 NOTE — Telephone Encounter (Signed)
Per note 11.12.15

## 2014-09-28 NOTE — Telephone Encounter (Signed)
°  1. Which medications need to be refilled? Eliquis and Lisinopril-new prescrioptions 2. Which pharmacy is medication to be sent to?[facility]  3. Do they need a 30 day or 90 day supply? 90 and refills  4. Would they like a call back once the medication has been sent to the pharmacy? yes

## 2014-12-19 ENCOUNTER — Other Ambulatory Visit: Payer: Self-pay | Admitting: *Deleted

## 2014-12-19 MED ORDER — APIXABAN 5 MG PO TABS: 5.0000 mg | ORAL_TABLET | Freq: Two times a day (BID) | ORAL | Status: DC

## 2014-12-20 DIAGNOSIS — H7012 Chronic mastoiditis, left ear: Secondary | ICD-10-CM | POA: Diagnosis not present

## 2014-12-20 DIAGNOSIS — H908 Mixed conductive and sensorineural hearing loss, unspecified: Secondary | ICD-10-CM | POA: Diagnosis not present

## 2014-12-20 DIAGNOSIS — H7201 Central perforation of tympanic membrane, right ear: Secondary | ICD-10-CM | POA: Diagnosis not present

## 2014-12-20 DIAGNOSIS — H6121 Impacted cerumen, right ear: Secondary | ICD-10-CM | POA: Diagnosis not present

## 2015-01-21 DIAGNOSIS — Z23 Encounter for immunization: Secondary | ICD-10-CM | POA: Diagnosis not present

## 2015-02-04 NOTE — Progress Notes (Signed)
HPI: FU atrial fibrillation. Myoview in June of 2013 showed diaphragmatic attenuation but no ischemia. Ejection fraction was 51%. We have elected rate control and anticoagulation for afib. Holter monitor in February of 2014 showed sinus rhythm, mobitz 1; toprol DCed. Echocardiogram in June of 2014 showed an ejection fraction of 40-45%. There was mild aortic and mitral regurgitation. There was mild left atrial enlargement. There was a pleural effusion. Patient seen in December of 2014 by Dr. Mare Ferrari with dizziness. Monitor showed atrial fibrillation. Since last seen, the patient denies any dyspnea on exertion, orthopnea, PND, pedal edema, palpitations, syncope or chest pain.   Current Outpatient Prescriptions  Medication Sig Dispense Refill  . acetaminophen (TYLENOL) 325 MG tablet Take 650 mg by mouth every 6 (six) hours as needed for pain.    [name] apixaban (ELIQUIS) 5 MG TABS tablet Take 1 tablet (5 mg total) by mouth 2 (two) times daily. 180 tablet 0  . hydrochlorothiazide (HYDRODIURIL) 25 MG tablet Take 0.5 tablets (12.5 mg total) by mouth daily.    [name] lisinopril (PRINIVIL,ZESTRIL) 20 MG tablet Take 1 tablet (20 mg total) by mouth daily. 180 tablet 0   No current facility-administered medications for this visit.     Past Medical History  Diagnosis Date  . Hypertension   . PUD (peptic ulcer disease)     Remote, history of  . BPH (benign prostatic hyperplasia)   . Urinary hesitancy   . Midsternal chest pain     a. 09/2008 Myoview: EF 56%, mild fixed thinning of inf wall particularly @ base -? attenuation.  No ischemia  . Dyspnea on exertion     a. 09/2008 NL EF on myoview.  [name] Hearing difficulty     R sided hole in eardrum, only wears L hearing aid  . Lumbar stenosis with neurogenic claudication     a. 02/2011 s/p decompressive laminectomy.  [name] History of tobacco abuse   . Atrial fibrillation [facility])     Past Surgical History  Procedure Laterality Date  . Tympanomastoidectomy        Left modified radical with type 3 tympanoplasty  . Hernia repair      Left  . Lumbar laminectomy/decompression microdiscectomy  03/04/2011    Procedure: LUMBAR LAMINECTOMY/DECOMPRESSION MICRODISCECTOMY;  Surgeon: Ophelia Charter;  Location: [facility];  Service: Neurosurgery;  Laterality: N/A;  LUMBAR THREE-FOUR ,FOUR-FIVE LAMINECTOMIES    Social History   Social History  . Marital Status: Married    Spouse Name: N/A  . Number of Children: 2  . Years of Education: N/A   Occupational History  . [employer]     Retired   Social History Main Topics  . Smoking status: Former Research scientist (life sciences)  . Smokeless tobacco: Never Used     Comment: Remote history of smoking  . Alcohol Use: No  . Drug Use: No  . Sexual Activity: Not on file   Other Topics Concern  . Not on file   Social History Narrative   Lives with his wife in [place].   He swims daily.   He has 2 grown daughters.   1 Caffeine drink daily     ROS: no fevers or chills, productive cough, hemoptysis, dysphasia, odynophagia, melena, hematochezia, dysuria, hematuria, rash, seizure activity, orthopnea, PND, pedal edema, claudication. Remaining systems are negative.  Physical Exam: Well-developed well-nourished in no acute distress.  Skin is warm and dry.  HEENT is normal.  Neck is supple.  Chest is clear to auscultation  with normal expansion.  Cardiovascular exam is irregular Abdominal exam nontender or distended. No masses palpated. Extremities show no edema. neuro grossly intact  ECG Atrial flutter with ventricular response at 62. Occasional PVC or aberrantly conducted beat. Nonspecific ST changes.

## 2015-02-05 ENCOUNTER — Ambulatory Visit (INDEPENDENT_AMBULATORY_CARE_PROVIDER_SITE_OTHER): Payer: Medicare Other | Admitting: Cardiology

## 2015-02-05 ENCOUNTER — Encounter: Payer: Self-pay | Admitting: Cardiology

## 2015-02-05 VITALS — BP 162/78 | HR 62 | Ht 70.0 in | Wt 182.9 lb

## 2015-02-05 DIAGNOSIS — I482 Chronic atrial fibrillation, unspecified: Secondary | ICD-10-CM

## 2015-02-05 DIAGNOSIS — E7800 Pure hypercholesterolemia, unspecified: Secondary | ICD-10-CM | POA: Diagnosis not present

## 2015-02-05 DIAGNOSIS — I420 Dilated cardiomyopathy: Secondary | ICD-10-CM

## 2015-02-05 DIAGNOSIS — I480 Paroxysmal atrial fibrillation: Secondary | ICD-10-CM | POA: Diagnosis not present

## 2015-02-05 DIAGNOSIS — I4891 Unspecified atrial fibrillation: Secondary | ICD-10-CM | POA: Diagnosis not present

## 2015-02-05 LAB — CBC
HCT: 37.5 % — ABNORMAL LOW (ref 39.0–52.0)
Hemoglobin: 12.8 g/dL — ABNORMAL LOW (ref 13.0–17.0)
MCH: 31.5 pg (ref 26.0–34.0)
MCHC: 34.1 g/dL (ref 30.0–36.0)
MCV: 92.4 fL (ref 78.0–100.0)
MPV: 9.9 fL (ref 8.6–12.4)
PLATELETS: 169 10*3/uL (ref 150–400)
RBC: 4.06 MIL/uL — AB (ref 4.22–5.81)
RDW: 14.6 % (ref 11.5–15.5)
WBC: 4.6 10*3/uL (ref 4.0–10.5)

## 2015-02-05 LAB — BASIC METABOLIC PANEL
BUN: 23 mg/dL (ref 7–25)
CALCIUM: 9.3 mg/dL (ref 8.6–10.3)
CHLORIDE: 100 mmol/L (ref 98–110)
CO2: 29 mmol/L (ref 20–31)
Creat: 1.38 mg/dL — ABNORMAL HIGH (ref 0.70–1.11)
Glucose, Bld: 102 mg/dL — ABNORMAL HIGH (ref 65–99)
POTASSIUM: 4.6 mmol/L (ref 3.5–5.3)
Sodium: 136 mmol/L (ref 135–146)

## 2015-02-05 NOTE — Assessment & Plan Note (Signed)
Management per primary care. 

## 2015-02-05 NOTE — Patient Instructions (Signed)
Medication Instructions:   NO CHANGE  Labwork:  Your physician recommends that you HAVE LAB WORK TODAY  Follow-Up:  Your physician wants you to follow-up in: 6 MONTHS WITH DR CRENSHAW You will receive a reminder letter in the mail two months in advance. If you don't receive a letter, please call our office to schedule the follow-up appointment.      

## 2015-02-05 NOTE — Assessment & Plan Note (Signed)
Plan conservative therapy. Continue ACE inhibitor. No beta blocker given history of bradycardia.

## 2015-02-05 NOTE — Assessment & Plan Note (Addendum)
Patient in atrial flutter today. He is asymptomatic and we plan rate control/anticoagulation. His rate is controlled on no medications. Continue apixaban. Check hemoglobin and renal function.

## 2015-02-05 NOTE — Assessment & Plan Note (Signed)
Blood pressure mildly elevated but he follows this at home and it is typically controlled. Follow and adjust regimen as needed. Check potassium and renal function.

## 2015-03-26 ENCOUNTER — Other Ambulatory Visit: Payer: Self-pay | Admitting: Cardiology

## 2015-03-26 MED ORDER — LISINOPRIL 20 MG PO TABS: 20.0000 mg | ORAL_TABLET | Freq: Every day | ORAL | Status: DC

## 2015-03-27 MED ORDER — APIXABAN 5 MG PO TABS: 5.0000 mg | ORAL_TABLET | Freq: Two times a day (BID) | ORAL | Status: DC

## 2015-04-11 DIAGNOSIS — H43813 Vitreous degeneration, bilateral: Secondary | ICD-10-CM | POA: Diagnosis not present

## 2015-04-11 DIAGNOSIS — H353122 Nonexudative age-related macular degeneration, left eye, intermediate dry stage: Secondary | ICD-10-CM | POA: Diagnosis not present

## 2015-04-11 DIAGNOSIS — H353114 Nonexudative age-related macular degeneration, right eye, advanced atrophic with subfoveal involvement: Secondary | ICD-10-CM | POA: Diagnosis not present

## 2015-04-27 ENCOUNTER — Emergency Department (HOSPITAL_COMMUNITY): Payer: Medicare Other

## 2015-04-27 ENCOUNTER — Emergency Department (HOSPITAL_COMMUNITY)
Admission: EM | Admit: 2015-04-27 | Discharge: 2015-04-27 | Disposition: A | Discharge status: Home / Self Care | Payer: Medicare Other | Attending: Emergency Medicine | Admitting: Emergency Medicine

## 2015-04-27 ENCOUNTER — Encounter (HOSPITAL_COMMUNITY): Payer: Self-pay | Admitting: Emergency Medicine

## 2015-04-27 DIAGNOSIS — Z8669 Personal history of other diseases of the nervous system and sense organs: Secondary | ICD-10-CM | POA: Diagnosis not present

## 2015-04-27 DIAGNOSIS — S80811A Abrasion, right lower leg, initial encounter: Secondary | ICD-10-CM | POA: Insufficient documentation

## 2015-04-27 DIAGNOSIS — S40021A Contusion of right upper arm, initial encounter: Secondary | ICD-10-CM

## 2015-04-27 DIAGNOSIS — Z79899 Other long term (current) drug therapy: Secondary | ICD-10-CM | POA: Diagnosis not present

## 2015-04-27 DIAGNOSIS — S40011A Contusion of right shoulder, initial encounter: Secondary | ICD-10-CM | POA: Diagnosis not present

## 2015-04-27 DIAGNOSIS — IMO0001 Reserved for inherently not codable concepts without codable children: Secondary | ICD-10-CM

## 2015-04-27 DIAGNOSIS — W07XXXA Fall from chair, initial encounter: Secondary | ICD-10-CM | POA: Diagnosis not present

## 2015-04-27 DIAGNOSIS — S0003XA Contusion of scalp, initial encounter: Secondary | ICD-10-CM | POA: Insufficient documentation

## 2015-04-27 DIAGNOSIS — Z7901 Long term (current) use of anticoagulants: Secondary | ICD-10-CM | POA: Insufficient documentation

## 2015-04-27 DIAGNOSIS — Z87891 Personal history of nicotine dependence: Secondary | ICD-10-CM | POA: Diagnosis not present

## 2015-04-27 DIAGNOSIS — S0093XA Contusion of unspecified part of head, initial encounter: Secondary | ICD-10-CM

## 2015-04-27 DIAGNOSIS — Z23 Encounter for immunization: Secondary | ICD-10-CM | POA: Diagnosis not present

## 2015-04-27 DIAGNOSIS — I10 Essential (primary) hypertension: Secondary | ICD-10-CM | POA: Insufficient documentation

## 2015-04-27 DIAGNOSIS — Z87438 Personal history of other diseases of male genital organs: Secondary | ICD-10-CM | POA: Insufficient documentation

## 2015-04-27 DIAGNOSIS — Y9389 Activity, other specified: Secondary | ICD-10-CM | POA: Insufficient documentation

## 2015-04-27 DIAGNOSIS — T07XXXA Unspecified multiple injuries, initial encounter: Secondary | ICD-10-CM

## 2015-04-27 DIAGNOSIS — Z974 Presence of external hearing-aid: Secondary | ICD-10-CM | POA: Diagnosis not present

## 2015-04-27 DIAGNOSIS — S0990XA Unspecified injury of head, initial encounter: Secondary | ICD-10-CM | POA: Diagnosis present

## 2015-04-27 DIAGNOSIS — Y9289 Other specified places as the place of occurrence of the external cause: Secondary | ICD-10-CM | POA: Insufficient documentation

## 2015-04-27 DIAGNOSIS — Y998 Other external cause status: Secondary | ICD-10-CM | POA: Diagnosis not present

## 2015-04-27 DIAGNOSIS — Z8711 Personal history of peptic ulcer disease: Secondary | ICD-10-CM | POA: Insufficient documentation

## 2015-04-27 DIAGNOSIS — S300XXA Contusion of lower back and pelvis, initial encounter: Secondary | ICD-10-CM | POA: Insufficient documentation

## 2015-04-27 DIAGNOSIS — S80812A Abrasion, left lower leg, initial encounter: Secondary | ICD-10-CM | POA: Diagnosis not present

## 2015-04-27 MED ORDER — TETANUS-DIPHTH-ACELL PERTUSSIS 5-2.5-18.5 LF-MCG/0.5 IM SUSP
0.5000 mL | Freq: Once | INTRAMUSCULAR | Status: AC
  Administered 2015-04-27: 0.5 mL via INTRAMUSCULAR
  Filled 2015-04-27: qty 0.5

## 2015-04-27 NOTE — Discharge Instructions (Signed)
Contusion A contusion is a deep bruise. Contusions are the result of a blunt injury to tissues and muscle fibers under the skin. The injury causes bleeding under the skin. The skin overlying the contusion may turn blue, purple, or yellow. Minor injuries will give you a painless contusion, but more severe contusions may stay painful and swollen for a few weeks.  CAUSES  This condition is usually caused by a blow, trauma, or direct force to an area of the body. SYMPTOMS  Symptoms of this condition include:  Swelling of the injured area.  Pain and tenderness in the injured area.  Discoloration. The area may have redness and then turn blue, purple, or yellow. DIAGNOSIS  This condition is diagnosed based on a physical exam and medical history. An X-ray, CT scan, or MRI may be needed to determine if there are any associated injuries, such as broken bones (fractures). TREATMENT  Specific treatment for this condition depends on what area of the body was injured. In general, the best treatment for a contusion is resting, icing, applying pressure to (compression), and elevating the injured area. This is often called the RICE strategy. Over-the-counter anti-inflammatory medicines may also be recommended for pain control.  HOME CARE INSTRUCTIONS   Rest the injured area.  If directed, apply ice to the injured area:  Put ice in a plastic bag.  Place a towel between your skin and the bag.  Leave the ice on for 20 minutes, 2-3 times per day.  If directed, apply light compression to the injured area using an elastic bandage. Make sure the bandage is not wrapped too tightly. Remove and reapply the bandage as directed by your health care provider.  If possible, raise (elevate) the injured area above the level of your heart while you are sitting or lying down.  Take over-the-counter and prescription medicines only as told by your health care provider. SEEK MEDICAL CARE IF:  Your symptoms do not  improve after several days of treatment.  Your symptoms get worse.  You have difficulty moving the injured area. SEEK IMMEDIATE MEDICAL CARE IF:   You have severe pain.  You have numbness in a hand or foot.  Your hand or foot turns pale or cold.   This information is not intended to replace advice given to you by your health care provider. Make sure you discuss any questions you have with your health care provider.   Document Released: 01/14/2005 Document Revised: 12/26/2014 Document Reviewed: 08/22/2014 Elsevier Interactive Patient Education 2016 [place] An abrasion is a cut or scrape on the outer surface of your skin. An abrasion does not extend through all of the layers of your skin. It is important to care for your abrasion properly to prevent infection. CAUSES Most abrasions are caused by falling on or gliding across the ground or another surface. When your skin rubs on something, the outer and inner layer of skin rubs off.  SYMPTOMS A cut or scrape is the main symptom of this condition. The scrape may be bleeding, or it may appear red or pink. If there was an associated fall, there may be an underlying bruise. DIAGNOSIS An abrasion is diagnosed with a physical exam. TREATMENT Treatment for this condition depends on how large and deep the abrasion is. Usually, your abrasion will be cleaned with water and mild soap. This removes any dirt or debris that may be stuck. An antibiotic ointment may be applied to the abrasion to help prevent infection. A bandage (dressing)  may be placed on the abrasion to keep it clean. You may also need a tetanus shot. HOME CARE INSTRUCTIONS Medicines  Take or apply medicines only as directed by your health care provider.  If you were prescribed an antibiotic ointment, finish all of it even if you start to feel better. Wound Care  Clean the wound with mild soap and water 2-3 times per day or as directed by your health care provider.  Pat your wound dry with a clean towel. Do not rub it.  There are many different ways to close and cover a wound. Follow instructions from your health care provider about:  Wound care.  Dressing changes and removal.  Check your wound every day for signs of infection. Watch for:  Redness, swelling, or pain.  Fluid, blood, or pus. General Instructions  Keep the dressing dry as directed by your health care provider. Do not take baths, swim, use a hot tub, or do anything that would put your wound underwater until your health care provider approves.  If there is swelling, raise (elevate) the injured area above the level of your heart while you are sitting or lying down.  Keep all follow-up visits as directed by your health care provider. This is important. SEEK MEDICAL CARE IF:  You received a tetanus shot and you have swelling, severe pain, redness, or bleeding at the injection site.  Your pain is not controlled with medicine.  You have increased redness, swelling, or pain at the site of your wound. SEEK IMMEDIATE MEDICAL CARE IF:  You have a red streak going away from your wound.  You have a fever.  You have fluid, blood, or pus coming from your wound.  You notice a bad smell coming from your wound or your dressing.   This information is not intended to replace advice given to you by your health care provider. Make sure you discuss any questions you have with your health care provider.   Document Released: 01/14/2005 Document Revised: 12/26/2014 Document Reviewed: 04/04/2014 Elsevier Interactive Patient Education [employer].

## 2015-04-27 NOTE — ED Notes (Signed)
Per pt, states he was changing a light bulb-stepped back and fell onto bottom the onto back and hit back of head-patient on [place]

## 2015-04-27 NOTE — ED Provider Notes (Signed)
CSN: [id number]     Arrival date & time 04/27/15  1821 History   First MD Initiated Contact with Patient 04/27/15 1903     Chief Complaint  Patient presents with  . Fall     (Consider location/radiation/quality/duration/timing/severity/associated sxs/prior Treatment) Patient is a 82 y.o. male presenting with fall. The history is provided by the patient. No language interpreter was used.  Fall This is a new problem. The current episode started today. The problem has been unchanged. Pertinent negatives include no abdominal pain or fever. Nothing aggravates the symptoms. He has tried nothing for the symptoms. The treatment provided moderate relief.  Pt was changing a lightbulb and fell of a chair.  Pt scraped his legs.  Pt hit on his bottom and hit the back of his head.  Pt is on a blood thinner for atrial fib.   Past Medical History  Diagnosis Date  . Hypertension   . PUD (peptic ulcer disease)     Remote, history of  . BPH (benign prostatic hyperplasia)   . Urinary hesitancy   . Midsternal chest pain     a. 09/2008 Myoview: EF 56%, mild fixed thinning of inf wall particularly @ base -? attenuation.  No ischemia  . Dyspnea on exertion     a. 09/2008 NL EF on myoview.  [name] Hearing difficulty     R sided hole in eardrum, only wears L hearing aid  . Lumbar stenosis with neurogenic claudication     a. 02/2011 s/p decompressive laminectomy.  [name] History of tobacco abuse   . Atrial fibrillation [facility])    Past Surgical History  Procedure Laterality Date  . Tympanomastoidectomy      Left modified radical with type 3 tympanoplasty  . Hernia repair      Left  . Lumbar laminectomy/decompression microdiscectomy  03/04/2011    Procedure: LUMBAR LAMINECTOMY/DECOMPRESSION MICRODISCECTOMY;  Surgeon: Ophelia Charter;  Location: [place] NEURO ORS;  Service: Neurosurgery;  Laterality: N/A;  LUMBAR THREE-FOUR ,FOUR-FIVE LAMINECTOMIES   Family History  Problem Relation Age of Onset  . Lung cancer Mother    . Appendicitis Father   . Diabetes Sister     DM  . Coronary artery disease Neg Hx     No premature CAD  . Colon cancer Neg Hx    Social History  Substance Use Topics  . Smoking status: Former Research scientist (life sciences)  . Smokeless tobacco: Never Used     Comment: Remote history of smoking  . Alcohol Use: No    Review of Systems  Constitutional: Negative for fever.  Gastrointestinal: Negative for abdominal pain.  All other systems reviewed and are negative.     Allergies  Review of patient's allergies indicates no known allergies.  Home Medications   Prior to Admission medications   Medication Sig Start Date End Date Taking? Authorizing Provider  acetaminophen (TYLENOL) 325 MG tablet Take 650 mg by mouth every 6 (six) hours as needed for pain.   Yes Historical Provider, MD  apixaban (ELIQUIS) 5 MG TABS tablet Take 1 tablet (5 mg total) by mouth 2 (two) times daily. 03/27/15  Yes Lelon Perla, MD  hydrochlorothiazide (HYDRODIURIL) 25 MG tablet Take 0.5 tablets (12.5 mg total) by mouth daily. 06/09/12  Yes Lelon Perla, MD  lisinopril (PRINIVIL,ZESTRIL) 20 MG tablet Take 1 tablet (20 mg total) by mouth daily. 03/26/15  Yes Lelon Perla, MD   BP 177/69 mmHg  Pulse 71  Temp(Src) 98 F (36.7 C) (Oral)  Resp 16  SpO2 99% Physical Exam  Constitutional: He appears well-developed and well-nourished.  HENT:  Head: Normocephalic.  Right Ear: External ear normal.  Left Ear: External ear normal.  Nose: Nose normal.  Mouth/Throat: Oropharynx is clear and moist.  2cm area of ecchymosis occipital scalp  Eyes: Conjunctivae are normal. Pupils are equal, round, and reactive to light.  Neck: Normal range of motion.  Cardiovascular: Normal rate and regular rhythm.   Pulmonary/Chest: Effort normal.  Abdominal: Soft.  Musculoskeletal: Normal range of motion.  Abrasions bilat lower legs,    Neurological: He is alert.  Skin: Skin is warm.  Psychiatric: He has a normal mood and affect.   Nursing note and vitals reviewed.   ED Course  Procedures (including critical care time) Labs Review Labs Reviewed - No data to display  Imaging Review Dg Lumbar Spine Complete  04/27/2015  CLINICAL DATA:  Recent fall with low back pain, initial encounter EXAM: LUMBAR SPINE - COMPLETE 4+ VIEW COMPARISON:  03/04/2011. FINDINGS: Five lumbar type vertebral bodies are well visualized. Multilevel osteophytic changes are seen. Facet hypertrophic changes are noted. Mild retrolisthesis of L4 with respect L5 is noted but stable from a prior exam from 2012. No compression deformities are noted. Diffuse aortic calcifications are seen. IMPRESSION: Multilevel degenerative change without acute abnormality. Electronically Signed   By: Inez Catalina M.D.   On: 04/27/2015 19:59   Dg Pelvis 1-2 Views  04/27/2015  CLINICAL DATA:  [place] at home today, low back pain EXAM: PELVIS - 1-2 VIEW COMPARISON:  None. FINDINGS: Pelvic bones are intact. Severe bilateral hip arthritis. Bilateral sacroiliac degenerative change. No fractures identified. IMPRESSION: No acute findings Electronically Signed   By: Skipper Cliche M.D.   On: 04/27/2015 19:55   Dg Shoulder Right  04/27/2015  CLINICAL DATA:  Pt had a fall at home trying to hang a light. Pt states he fell off the chair an hit his head. Pt c/o right shoulder pain and lower back pain. Hx lumbar surgery. EXAM: RIGHT SHOULDER - 2+ VIEW COMPARISON:  None. FINDINGS: No acute fracture. No dislocation. Bones are demineralized. Soft tissues are unremarkable. IMPRESSION: No fracture or dislocation. Electronically Signed   By: Lajean Manes M.D.   On: 04/27/2015 19:54   Ct Head Wo Contrast  04/27/2015  CLINICAL DATA:  Patient was changing a light bulb and fell after stepping backward hitting the back of his head. EXAM: CT HEAD WITHOUT CONTRAST CT CERVICAL SPINE WITHOUT CONTRAST TECHNIQUE: Multidetector CT imaging of the head and cervical spine was performed following the standard  protocol without intravenous contrast. Multiplanar CT image reconstructions of the cervical spine were also generated. COMPARISON:  None. FINDINGS: CT HEAD FINDINGS The ventricles are normal in configuration. There is age related ventricular and sulcal enlargement. No hydrocephalus. There are no parenchymal masses or mass effect. There is no evidence of a cortical infarct. Areas of white matter hypoattenuation are noted consistent with mild chronic microvascular ischemic change. There are no extra-axial masses or abnormal fluid collections. There is no intracranial hemorrhage. No skull fracture. Visualized sinuses and right mastoid air cells are clear. Patient has had a previous left mastoidectomy. CT CERVICAL SPINE FINDINGS No fracture. No spondylolisthesis. There is moderate-to-marked loss of disc height at C3-C4. Mild loss of disc height at Celsius 6-C7 moderate neural foraminal narrowing noted on the right at C3-C4, C4-C5 and C5-C6. Soft tissues show carotid artery vascular calcifications but are otherwise unremarkable. Lung apices are clear. IMPRESSION: HEAD CT:  No acute intracranial abnormalities.  No skull fracture. CERVICAL CT:  No fracture or acute finding. Electronically Signed   By: Lajean Manes M.D.   On: 04/27/2015 20:04   Ct Cervical Spine Wo Contrast  04/27/2015  CLINICAL DATA:  Patient was changing a light bulb and fell after stepping backward hitting the back of his head. EXAM: CT HEAD WITHOUT CONTRAST CT CERVICAL SPINE WITHOUT CONTRAST TECHNIQUE: Multidetector CT imaging of the head and cervical spine was performed following the standard protocol without intravenous contrast. Multiplanar CT image reconstructions of the cervical spine were also generated. COMPARISON:  None. FINDINGS: CT HEAD FINDINGS The ventricles are normal in configuration. There is age related ventricular and sulcal enlargement. No hydrocephalus. There are no parenchymal masses or mass effect. There is no evidence of a  cortical infarct. Areas of white matter hypoattenuation are noted consistent with mild chronic microvascular ischemic change. There are no extra-axial masses or abnormal fluid collections. There is no intracranial hemorrhage. No skull fracture. Visualized sinuses and right mastoid air cells are clear. Patient has had a previous left mastoidectomy. CT CERVICAL SPINE FINDINGS No fracture. No spondylolisthesis. There is moderate-to-marked loss of disc height at C3-C4. Mild loss of disc height at Celsius 6-C7 moderate neural foraminal narrowing noted on the right at C3-C4, C4-C5 and C5-C6. Soft tissues show carotid artery vascular calcifications but are otherwise unremarkable. Lung apices are clear. IMPRESSION: HEAD CT:  No acute intracranial abnormalities.  No skull fracture. CERVICAL CT:  No fracture or acute finding. Electronically Signed   By: Lajean Manes M.D.   On: 04/27/2015 20:04   I have personally reviewed and evaluated these images and lab results as part of my medical decision-making.   EKG Interpretation None      MDM dressing to abraisions,  Pt advised to not take his evening blood thinner.  Pt lives with family who will observe him.     Final diagnoses:  Contusion of head, initial encounter  Abrasions of multiple sites  Contusion shoulder/arm, right, initial encounter  Pelvic contusion, initial encounter    An After Visit Summary was printed and given to the patient.    Hollace Kinnier [place], PA-C 04/27/15 [id number]  Julianne Rice, MD 04/28/15 [phone]

## 2015-06-19 DIAGNOSIS — D225 Melanocytic nevi of trunk: Secondary | ICD-10-CM | POA: Diagnosis not present

## 2015-06-19 DIAGNOSIS — L568 Other specified acute skin changes due to ultraviolet radiation: Secondary | ICD-10-CM | POA: Diagnosis not present

## 2015-06-19 DIAGNOSIS — X32XXXD Exposure to sunlight, subsequent encounter: Secondary | ICD-10-CM | POA: Diagnosis not present

## 2015-06-19 DIAGNOSIS — L570 Actinic keratosis: Secondary | ICD-10-CM | POA: Diagnosis not present

## 2015-08-09 NOTE — Progress Notes (Signed)
HPI: FU atrial fibrillation. Myoview in June of 2013 showed diaphragmatic attenuation but no ischemia. Ejection fraction was 51%. We have elected rate control and anticoagulation for afib. Holter monitor in February of 2014 showed sinus rhythm, mobitz 1; toprol DCed. Echocardiogram in June of 2014 showed an ejection fraction of 40-45%. There was mild aortic and mitral regurgitation. There was mild left atrial enlargement. There was a pleural effusion. Patient seen in December of 2014 by Dr. Mare Ferrari with dizziness. Monitor showed atrial fibrillation. [place] off a chair while changing light in January 2017. Since last seen, He denies dyspnea, chest pain, palpitations, syncope or bleeding.  Current Outpatient Prescriptions  Medication Sig Dispense Refill  . acetaminophen (TYLENOL) 325 MG tablet Take 650 mg by mouth every 6 (six) hours as needed for pain.    [name] apixaban (ELIQUIS) 5 MG TABS tablet Take 1 tablet (5 mg total) by mouth 2 (two) times daily. 180 tablet 1  . hydrochlorothiazide (HYDRODIURIL) 25 MG tablet Take 0.5 tablets (12.5 mg total) by mouth daily.    [name] lisinopril (PRINIVIL,ZESTRIL) 20 MG tablet Take 1 tablet (20 mg total) by mouth daily. 180 tablet 1   No current facility-administered medications for this visit.     Past Medical History  Diagnosis Date  . Hypertension   . PUD (peptic ulcer disease)     Remote, history of  . BPH (benign prostatic hyperplasia)   . Urinary hesitancy   . Midsternal chest pain     a. 09/2008 Myoview: EF 56%, mild fixed thinning of inf wall particularly @ base -? attenuation.  No ischemia  . Dyspnea on exertion     a. 09/2008 NL EF on myoview.  [name] Hearing difficulty     R sided hole in eardrum, only wears L hearing aid  . Lumbar stenosis with neurogenic claudication     a. 02/2011 s/p decompressive laminectomy.  [name] History of tobacco abuse   . Atrial fibrillation [facility])     Past Surgical History  Procedure Laterality Date  .  Tympanomastoidectomy      Left modified radical with type 3 tympanoplasty  . Hernia repair      Left  . Lumbar laminectomy/decompression microdiscectomy  03/04/2011    Procedure: LUMBAR LAMINECTOMY/DECOMPRESSION MICRODISCECTOMY;  Surgeon: Ophelia Charter;  Location: [place] NEURO ORS;  Service: Neurosurgery;  Laterality: N/A;  LUMBAR THREE-FOUR ,FOUR-FIVE LAMINECTOMIES    Social History   Social History  . Marital Status: Married    Spouse Name: N/A  . Number of Children: 2  . Years of Education: N/A   Occupational History  . [employer]     Retired   Social History Main Topics  . Smoking status: Former Research scientist (life sciences)  . Smokeless tobacco: Never Used     Comment: Remote history of smoking  . Alcohol Use: No  . Drug Use: No  . Sexual Activity: Not on file   Other Topics Concern  . Not on file   Social History Narrative   Lives with his wife in [place].   He swims daily.   He has 2 grown daughters.   1 Caffeine drink daily     Family History  Problem Relation Age of Onset  . Lung cancer Mother   . Appendicitis Father   . Diabetes Sister     DM  . Coronary artery disease Neg Hx     No premature CAD  . Colon cancer Neg Hx     ROS: no  fevers or chills, productive cough, hemoptysis, dysphasia, odynophagia, melena, hematochezia, dysuria, hematuria, rash, seizure activity, orthopnea, PND, pedal edema, claudication. Remaining systems are negative.  Physical Exam: Well-developed well-nourished in no acute distress.  Skin is warm and dry.  HEENT is normal.  Neck is supple.  Chest is clear to auscultation with normal expansion.  Cardiovascular exam is irregular Abdominal exam nontender or distended. No masses palpated. Extremities show no edema. neuro grossly intact

## 2015-08-13 ENCOUNTER — Ambulatory Visit (INDEPENDENT_AMBULATORY_CARE_PROVIDER_SITE_OTHER): Payer: Medicare Other | Admitting: Cardiology

## 2015-08-13 ENCOUNTER — Ambulatory Visit: Payer: Medicare Other | Admitting: Cardiology

## 2015-08-13 ENCOUNTER — Encounter: Payer: Self-pay | Admitting: Cardiology

## 2015-08-13 VITALS — BP 130/70 | HR 70 | Ht 70.0 in | Wt 177.4 lb

## 2015-08-13 DIAGNOSIS — I420 Dilated cardiomyopathy: Secondary | ICD-10-CM

## 2015-08-13 DIAGNOSIS — I10 Essential (primary) hypertension: Secondary | ICD-10-CM | POA: Diagnosis not present

## 2015-08-13 DIAGNOSIS — I4891 Unspecified atrial fibrillation: Secondary | ICD-10-CM | POA: Diagnosis not present

## 2015-08-13 LAB — CBC
HCT: 37.9 % — ABNORMAL LOW (ref 38.5–50.0)
HEMOGLOBIN: 12.7 g/dL — AB (ref 13.2–17.1)
MCH: 30.3 pg (ref 27.0–33.0)
MCHC: 33.5 g/dL (ref 32.0–36.0)
MCV: 90.5 fL (ref 80.0–100.0)
MPV: 10.5 fL (ref 7.5–12.5)
PLATELETS: 236 10*3/uL (ref 140–400)
RBC: 4.19 MIL/uL — AB (ref 4.20–5.80)
RDW: 14 % (ref 11.0–15.0)
WBC: 6.6 10*3/uL (ref 3.8–10.8)

## 2015-08-13 LAB — BASIC METABOLIC PANEL
BUN: 34 mg/dL — ABNORMAL HIGH (ref 7–25)
CALCIUM: 9.4 mg/dL (ref 8.6–10.3)
CO2: 28 mmol/L (ref 20–31)
Chloride: 99 mmol/L (ref 98–110)
Creat: 1.57 mg/dL — ABNORMAL HIGH (ref 0.70–1.11)
GLUCOSE: 110 mg/dL — AB (ref 65–99)
POTASSIUM: 4.5 mmol/L (ref 3.5–5.3)
SODIUM: 134 mmol/L — AB (ref 135–146)

## 2015-08-13 NOTE — Assessment & Plan Note (Signed)
Management per primary care. 

## 2015-08-13 NOTE — Assessment & Plan Note (Signed)
Blood pressure controlled. Continue present medications. Check potassium and renal function. 

## 2015-08-13 NOTE — Patient Instructions (Signed)

## 2015-08-13 NOTE — Assessment & Plan Note (Signed)
Rate is controlled on no medications. Continue apixaban. Check hemoglobin and renal function.

## 2015-08-13 NOTE — Assessment & Plan Note (Signed)
Continue ACE inhibitor. No beta blocker given history of bradycardia. Conservative measures given age.

## 2015-08-14 ENCOUNTER — Telehealth: Payer: Self-pay | Admitting: *Deleted

## 2015-08-14 DIAGNOSIS — N289 Disorder of kidney and ureter, unspecified: Secondary | ICD-10-CM

## 2015-08-14 NOTE — Telephone Encounter (Signed)
Spoke with pt, he is aware of lab results and voiced understanding of medication change. He repeated the instructions. Lab orders mailed to the pt

## 2015-08-14 NOTE — Telephone Encounter (Signed)
-----   Message from Lelon Perla, MD sent at 08/14/2015  6:08 AM EDT ----- Dc hctz, increase po fluid intake, bmet one week If cr 1.5 or greater with fu labs will decrease dose of eliquis at that time Kirk Ruths

## 2015-08-24 LAB — BASIC METABOLIC PANEL
BUN: 29 mg/dL — ABNORMAL HIGH (ref 7–25)
CALCIUM: 9.3 mg/dL (ref 8.6–10.3)
CO2: 26 mmol/L (ref 20–31)
CREATININE: 1.3 mg/dL — AB (ref 0.70–1.11)
Chloride: 99 mmol/L (ref 98–110)
GLUCOSE: 107 mg/dL — AB (ref 65–99)
Potassium: 4.8 mmol/L (ref 3.5–5.3)
SODIUM: 136 mmol/L (ref 135–146)

## 2015-09-18 ENCOUNTER — Other Ambulatory Visit: Payer: Self-pay | Admitting: Pharmacist

## 2015-09-18 MED ORDER — APIXABAN 5 MG PO TABS: 5.0000 mg | ORAL_TABLET | Freq: Two times a day (BID) | ORAL | Status: DC

## 2015-09-19 ENCOUNTER — Encounter: Payer: Self-pay | Admitting: Cardiology

## 2015-10-23 ENCOUNTER — Encounter: Payer: Self-pay | Admitting: Podiatry

## 2015-10-23 ENCOUNTER — Ambulatory Visit (INDEPENDENT_AMBULATORY_CARE_PROVIDER_SITE_OTHER): Payer: Medicare Other | Admitting: Podiatry

## 2015-10-23 VITALS — BP 123/80 | HR 71 | Resp 14

## 2015-10-23 DIAGNOSIS — M79676 Pain in unspecified toe(s): Secondary | ICD-10-CM

## 2015-10-23 DIAGNOSIS — B351 Tinea unguium: Secondary | ICD-10-CM

## 2015-10-23 NOTE — Progress Notes (Signed)
   Subjective:    Patient ID: Ralph Mendoza, male    DOB: 22-Apr-1920, 82 y.o.   MRN: [id number]  HPI this patient presents the office with chief complaint of long thick painful nails. Patient states the nails are painful walking and wearing his shoes. He is unable to self treat and he is presently taking eliquiss. He presents the office for preventative foot care services    Review of Systems  All other systems reviewed and are negative.      Objective:   Physical Exam GENERAL APPEARANCE: Alert, conversant. Appropriately groomed. No acute distress.  VASCULAR: Pedal pulses are  palpable at  [facility] and PT bilateral.  Capillary refill time is immediate to all digits,  Normal temperature gradient.  Digital hair growth is present bilateral  NEUROLOGIC: sensation is normal to 5.07 monofilament at 5/5 sites bilateral.  Light touch is intact bilateral, Muscle strength normal.  MUSCULOSKELETAL: acceptable muscle strength, tone and stability bilateral.  Intrinsic muscluature intact bilateral.  Rectus appearance of foot and digits noted bilateral.   DERMATOLOGIC: skin color, texture, and turgor are within normal limits.  No preulcerative lesions or ulcers  are seen, no interdigital maceration noted.  No open lesions present.  . No drainage noted.  NAILS  Thick disfigured discolored nails both feet.         Assessment & Plan:  Onychomycosis B/L  Debridement of nails  RTC prn.   Gardiner Barefoot DPM

## 2015-12-08 ENCOUNTER — Emergency Department (HOSPITAL_COMMUNITY): Payer: Medicare Other

## 2015-12-08 ENCOUNTER — Encounter (HOSPITAL_COMMUNITY): Payer: Self-pay | Admitting: Emergency Medicine

## 2015-12-08 ENCOUNTER — Emergency Department (HOSPITAL_COMMUNITY)
Admission: EM | Admit: 2015-12-08 | Discharge: 2015-12-08 | Disposition: A | Discharge status: Home / Self Care | Payer: Medicare Other | Attending: Emergency Medicine | Admitting: Emergency Medicine

## 2015-12-08 DIAGNOSIS — J069 Acute upper respiratory infection, unspecified: Secondary | ICD-10-CM | POA: Insufficient documentation

## 2015-12-08 DIAGNOSIS — Z87891 Personal history of nicotine dependence: Secondary | ICD-10-CM | POA: Diagnosis not present

## 2015-12-08 DIAGNOSIS — R0602 Shortness of breath: Secondary | ICD-10-CM | POA: Diagnosis present

## 2015-12-08 DIAGNOSIS — Z79899 Other long term (current) drug therapy: Secondary | ICD-10-CM | POA: Diagnosis not present

## 2015-12-08 DIAGNOSIS — I10 Essential (primary) hypertension: Secondary | ICD-10-CM | POA: Diagnosis not present

## 2015-12-08 LAB — BASIC METABOLIC PANEL
Anion gap: 7 (ref 5–15)
BUN: 24 mg/dL — AB (ref 6–20)
CHLORIDE: 105 mmol/L (ref 101–111)
CO2: 25 mmol/L (ref 22–32)
CREATININE: 1.3 mg/dL — AB (ref 0.61–1.24)
Calcium: 8.8 mg/dL — ABNORMAL LOW (ref 8.9–10.3)
GFR, EST AFRICAN AMERICAN: 53 mL/min — AB (ref >60)
GFR, EST NON AFRICAN AMERICAN: 46 mL/min — AB (ref >60)
Glucose, Bld: 112 mg/dL — ABNORMAL HIGH (ref 65–99)
POTASSIUM: 3.9 mmol/L (ref 3.5–5.1)
SODIUM: 137 mmol/L (ref 135–145)

## 2015-12-08 LAB — CBC
HCT: 37.8 % — ABNORMAL LOW (ref 39.0–52.0)
HEMOGLOBIN: 12.8 g/dL — AB (ref 13.0–17.0)
MCH: 31 pg (ref 26.0–34.0)
MCHC: 33.9 g/dL (ref 30.0–36.0)
MCV: 91.5 fL (ref 78.0–100.0)
PLATELETS: 133 10*3/uL — AB (ref 150–400)
RBC: 4.13 MIL/uL — AB (ref 4.22–5.81)
RDW: 14.6 % (ref 11.5–15.5)
WBC: 4.4 10*3/uL (ref 4.0–10.5)

## 2015-12-08 LAB — I-STAT TROPONIN, ED: TROPONIN I, POC: 0.05 ng/mL (ref 0.00–0.08)

## 2015-12-08 LAB — BRAIN NATRIURETIC PEPTIDE: B Natriuretic Peptide: 481.5 pg/mL — ABNORMAL HIGH (ref 0.0–100.0)

## 2015-12-08 NOTE — ED Notes (Signed)
When asking the patient to try to give another sample patient stated that he has tried and cant. Patient also stated that its probably due to his enlarged prostate. Patient stated that back in May he was diagnosed with an enlarged prostate and given to prescriptions that he ripped up and threw away. Patients son was not aware of this situation. Patient stated that he didn't want to be cathed and can pee at home and bring the sample in.

## 2015-12-08 NOTE — ED Provider Notes (Signed)
[facility] DEPT Provider Note   CSN: [id number] Arrival date & time: 12/08/15  0931     History   Chief Complaint Chief Complaint  Patient presents with  . Shortness of Breath  . Weakness   HPI Ralph Mendoza is a 82 y.o. male.  HPI  82 y.o. male with a hx of Afib on Eliquis, HTN, presents to the Emergency Department today complaining of increase shortness of breath x 3-4 weeks. Notes congestion as well as dry cough. Pt notes feeling pressure in the center of his chest, but not pain. Does not endorse any pain currently. Pt states he has been feeling weak as of late. No N/V/D. No fevers. No headaches. No dizziness. No diaphoresis. No chills or night sweats. Pt with no hx ACS. No hx DVT/PE. Pt on Eliquis for A Fib. No other symptoms noted. No recent hospitalizations.    Past Medical History:  Diagnosis Date  . Atrial fibrillation ([place])   . BPH (benign prostatic hyperplasia)   . Dyspnea on exertion    a. 09/2008 NL EF on myoview.  [name] Hearing difficulty    R sided hole in eardrum, only wears L hearing aid  . History of tobacco abuse   . Hypertension   . Lumbar stenosis with neurogenic claudication    a. 02/2011 s/p decompressive laminectomy.  . Midsternal chest pain    a. 09/2008 Myoview: EF 56%, mild fixed thinning of inf wall particularly @ base -? attenuation.  No ischemia  . PUD (peptic ulcer disease)    Remote, history of  . Urinary hesitancy     Patient Active Problem List   Diagnosis Date Noted  . Near syncope 04/07/2013  . Congestive dilated cardiomyopathy ([place]) 06/09/2012  . Atrial fibrillation ([place]) 05/17/2012  . Midsternal chest pain 09/21/2011  . Lumbar stenosis with neurogenic claudication 03/04/2011  . HYPERCHOLESTEROLEMIA-PURE 09/11/2008  . DYSPNEA 09/11/2008  . ABDOMINAL PAIN RIGHT LOWER QUADRANT 09/11/2008  . Essential hypertension 09/10/2008    Past Surgical History:  Procedure Laterality Date  . HERNIA REPAIR     Left  . LUMBAR  LAMINECTOMY/DECOMPRESSION MICRODISCECTOMY  03/04/2011   Procedure: LUMBAR LAMINECTOMY/DECOMPRESSION MICRODISCECTOMY;  Surgeon: Ophelia Charter;  Location: [facility];  Service: Neurosurgery;  Laterality: N/A;  LUMBAR THREE-FOUR ,FOUR-FIVE LAMINECTOMIES  . TYMPANOMASTOIDECTOMY     Left modified radical with type 3 tympanoplasty       Home Medications    Prior to Admission medications   Medication Sig Start Date End Date Taking? Authorizing Provider  acetaminophen (TYLENOL) 325 MG tablet Take 650 mg by mouth every 6 (six) hours as needed for pain.    Historical Provider, MD  apixaban (ELIQUIS) 5 MG TABS tablet Take 1 tablet (5 mg total) by mouth 2 (two) times daily. 09/18/15   Lelon Perla, MD  lisinopril (PRINIVIL,ZESTRIL) 20 MG tablet Take 1 tablet (20 mg total) by mouth daily. 03/26/15   Lelon Perla, MD    Family History Family History  Problem Relation Age of Onset  . Lung cancer Mother   . Appendicitis Father   . Diabetes Sister     DM  . Coronary artery disease Neg Hx     No premature CAD  . Colon cancer Neg Hx     Social History Social History  Substance Use Topics  . Smoking status: Former Research scientist (life sciences)  . Smokeless tobacco: Never Used     Comment: Remote history of smoking  . Alcohol use No  Allergies   Review of patient's allergies indicates no known allergies.   Review of Systems Review of Systems ROS reviewed and all are negative for acute change except as noted in the HPI.  Physical Exam Updated Vital Signs BP 146/68 (BP Location: Right Arm)   Pulse 77   Temp 97 F (36.1 C) (Oral)   Resp 20   SpO2 100%   Physical Exam  Constitutional: He is oriented to person, place, and time. Vital signs are normal. He appears well-developed and well-nourished.  HENT:  Head: Normocephalic and atraumatic.  Right Ear: Hearing normal.  Left Ear: Hearing normal.  Eyes: Conjunctivae and EOM are normal. Pupils are equal, round, and reactive to light.  Neck:  Normal range of motion. Neck supple. No tracheal deviation present.  Cardiovascular: Normal rate, normal heart sounds, intact distal pulses and normal pulses.  An irregularly irregular rhythm present.  No murmur heard. Pulmonary/Chest: Effort normal. He has wheezes in the right upper field, the right lower field, the left upper field and the left lower field.  Neurological: He is alert and oriented to person, place, and time.  Skin: Skin is warm and dry.  Psychiatric: He has a normal mood and affect. His speech is normal and behavior is normal. Thought content normal.  Nursing note and vitals reviewed.  ED Treatments / Results  Labs (all labs ordered are listed, but only abnormal results are displayed) Labs Reviewed  CBC - Abnormal; Notable for the following:       Result Value   RBC 4.13 (*)    Hemoglobin 12.8 (*)    HCT 37.8 (*)    Platelets 133 (*)    All other components within normal limits  BASIC METABOLIC PANEL - Abnormal; Notable for the following:    Glucose, Bld 112 (*)    BUN 24 (*)    Creatinine, Ser 1.30 (*)    Calcium 8.8 (*)    GFR calc non Af Amer 46 (*)    GFR calc Af Amer 53 (*)    All other components within normal limits  BRAIN NATRIURETIC PEPTIDE - Abnormal; Notable for the following:    B Natriuretic Peptide 481.5 (*)    All other components within normal limits  URINALYSIS, ROUTINE W REFLEX MICROSCOPIC (NOT AT [facility])  I-STAT TROPOININ, ED   EKG  EKG Interpretation  Date/Time:  Sunday December 08 2015 10:23:32 EDT Ventricular Rate:  76 PR Interval:    QRS Duration: 90 QT Interval:  555 QTC Calculation: 625 R Axis:   -82 Text Interpretation:  poor data quality, ? a flutter Multiple ventricular premature complexes Left anterior fascicular block Anteroseptal infarct, age indeterminate Borderline T abnormalities, inferior leads Prolonged QT interval Confirmed by KNAPP  MD-J, JON [id number]) on 12/08/2015 10:53:41 AM      Radiology Dg Chest 2 View  Result  Date: 12/08/2015 CLINICAL DATA:  Wheezing with shortness of breath EXAM: CHEST  2 VIEW COMPARISON:  October 10, 2012 FINDINGS: There are small pleural effusions bilaterally. There is mild bibasilar atelectasis. There is no Britton edema or consolidation. Heart is mildly enlarged with pulmonary vascularity within normal limits. There is atherosclerotic calcification in the aortic arch. No adenopathy. There is mild degenerative change in the thoracic spine. IMPRESSION: Mild cardiomegaly with small pleural effusions. There may be a degree of congestive heart failure. No pulmonary edema evident, however. There is mild bibasilar atelectasis. There is aortic atherosclerosis. Electronically Signed   By: Lowella Grip III M.D.  On: 12/08/2015 10:25    Procedures Procedures (including critical care time)  Medications Ordered in ED Medications - No data to display   Initial Impression / Assessment and Plan / ED Course  I have reviewed the triage vital signs and the nursing notes.  Pertinent labs & imaging results that were available during my care of the patient were reviewed by me and considered in my medical decision making (see chart for details).  Clinical Course    Final Clinical Impressions(s) / ED Diagnoses  I have reviewed and evaluated the relevant laboratory valuesI have reviewed and evaluated the relevant imaging studies. I have interpreted the relevant EKG.I have reviewed the relevant previous healthcare records.I obtained HPI from historian. Patient discussed with supervising physician  ED Course:  Assessment: Pt is a 89yM with hx Afib on Eliquis, HTN who presents with increasing SOB and weakness x 3-4 weeks. Congestion. Dry Cough. On exam, pt in NAD. Nontoxic/nonseptic appearing. VSS. Afebrile. Lungs with bilateral wheeze. Abdomen nontender soft. CBC/BMP unremarkable and baseline. iStat Trop negative. EKG without acute abnormalities. CXR with mild cardiomegaly and small pleural effusions.  Possible early CHF. No pulmonary edema. No PNA. BNP in the 400s. Seen by supervising physician. Likely URI causing symptoms. Labs unremarkable. Low indications for ACS. Pt unable to provide UA and wished to go home. Counseled patient that possible UTI could cause weakness and understands. Will follow up with PCP if symptoms persist this week. Counseled on OTC medications for URI. Otherwise patient stable for DC. Plan is to DC home with follow up to PCP. At time of discharge, Patient is in no acute distress. Vital Signs are stable. Patient is able to ambulate. Patient able to tolerate PO.    Disposition/Plan:  DC Home Additional Verbal discharge instructions given and discussed with patient.  Pt Instructed to f/u with PCP in the next week for evaluation and treatment of symptoms. Return precautions given Pt acknowledges and agrees with plan  Supervising Physician Dorie Rank, MD   Final diagnoses:  URI (upper respiratory infection)    New Prescriptions New Prescriptions   No medications on file     Shary Decamp, PA-C 12/08/15 1253    Dorie Rank, MD 12/10/15 [phone]

## 2015-12-08 NOTE — Discharge Instructions (Signed)
Please read and follow all provided instructions.  Your diagnoses today include:  1. URI (upper respiratory infection)    Tests performed today include: Vital signs. See below for your results today.   Medications prescribed:  Take as prescribed   Home care instructions:  Follow any educational materials contained in this packet.  Follow-up instructions: Please follow-up with your primary care provider for further evaluation of symptoms and treatment   Return instructions:  Please return to the Emergency Department if you do not get better, if you get worse, or new symptoms OR  - Fever (temperature greater than 101.81F)  - Bleeding that does not stop with holding pressure to the area    -Severe pain (please note that you may be more sore the day after your accident)  - Chest Pain  - Difficulty breathing  - Severe nausea or vomiting  - Inability to tolerate food and liquids  - Passing out  - Skin becoming red around your wounds  - Change in mental status (confusion or lethargy)  - New numbness or weakness    Please return if you have any other emergent concerns.  Additional Information:  Your vital signs today were: BP 146/68 (BP Location: Right Arm)    Pulse 77    Temp 97 F (36.1 C) (Oral)    Resp 20    SpO2 100%  If your blood pressure (BP) was elevated above 135/85 this visit, please have this repeated by your doctor within one month. ---------------

## 2015-12-08 NOTE — ED Notes (Signed)
Pt aware urine is needed. Pt unable to void at present time, given po fluids.

## 2015-12-08 NOTE — ED Triage Notes (Signed)
Pt c/o worsening weakness and dyspnea on exertion, worsening congestion with non productive cough.

## 2015-12-08 NOTE — ED Provider Notes (Signed)
Patient presented to the emergency room with complaints of cough congestion for the last few weeks. He's also complained of some generalized weakness. He notices it mostly in the mornings. He's had some mild discomfort in the center of chest as well. Patient denies any current chest pain. He has not seen anyone for this complaint until today in the emergency room.   On exam the patient is speaking clearly in full sentences without any difficulty. HEENT: Unremarkable Heart: Regular rate and rhythm Lungs: Faint wheezes noted on the right Extremities: No edema or erythema, no calf tenderness   EKG Interpretation  Date/Time:  Sunday December 08 2015 10:23:32 EDT Ventricular Rate:  76 PR Interval:    QRS Duration: 90 QT Interval:  555 QTC Calculation: 625 R Axis:   -82 Text Interpretation:  poor data quality, ? a flutter Multiple ventricular premature complexes Left anterior fascicular block Anteroseptal infarct, age indeterminate Borderline T abnormalities, inferior leads Prolonged QT interval Confirmed by Tracyann Duffell  MD-J, Lennox Dolberry [id number]) on 12/08/2015 10:53:41 AM       Clinical Course   Suspect resp illness as the source of his sx.  ED eval reassuring.  DC home supportive care.  Follow up with PCP  Medical screening examination/treatment/procedure(s) were conducted as a shared visit with non-physician practitioner(s) and myself.  I personally evaluated the patient during the encounter.   Dorie Rank, MD 12/10/15 Einar Crow

## 2015-12-11 NOTE — Progress Notes (Signed)
Cardiology Office Note   Date:  12/12/2015   ID:  Ralph Mendoza, DOB 1921-01-17, MRN [id number]  PCP:  [name], MD  Cardiologist:  Dr. Stanford Breed    No chief complaint on file.     History of Present Illness: Ralph Mendoza is a 82 y.o. male who presents for post ER visit for SOB and was Dx'd with URI but with elevated BNP and CXR with mild CHF he was sent by PCP to cardiology.    He has a hx. Of atrial fibrillation. Myoview in June of 2013 showed diaphragmatic attenuation but no ischemia. Ejection fraction was 51%. We have elected rate control and anticoagulation for afib. Holter monitor in February of 2014 showed sinus rhythm, mobitz 1; toprol DCed. Echocardiogram in June of 2014 showed an ejection fraction of 40-45%. There was mild aortic and mitral regurgitation. There was mild left atrial enlargement. There was a pleural effusion. Patient seen in December of 2014 by Dr. Mare Ferrari with dizziness. Monitor showed atrial fibrillation. [place] off a chair while changing light in January 2017. Since last seen, He denies dyspnea, chest pain, palpitations, syncope or bleeding.  Today he continues with DOE, and with any activity he has fatigue.  No chest pain. WT is up close to 20 lbs. No lower ext edema but it is in abd.  He has moved with his wife to assisted living at [facility].  He denies any increased salt in foods.  While this is a good move for his wife he is somewhat bored.  He states his appetite is also decreased.    Past Medical History:  Diagnosis Date  . Atrial fibrillation ([place])   . BPH (benign prostatic hyperplasia)   . Dyspnea on exertion    a. 09/2008 NL EF on myoview.  [name] Hearing difficulty    R sided hole in eardrum, only wears L hearing aid  . History of tobacco abuse   . Hypertension   . Lumbar stenosis with neurogenic claudication    a. 02/2011 s/p decompressive laminectomy.  . Midsternal chest pain    a. 09/2008 Myoview: EF 56%, mild fixed thinning of inf  wall particularly @ base -? attenuation.  No ischemia  . PUD (peptic ulcer disease)    Remote, history of  . Urinary hesitancy     Past Surgical History:  Procedure Laterality Date  . HERNIA REPAIR     Left  . LUMBAR LAMINECTOMY/DECOMPRESSION MICRODISCECTOMY  03/04/2011   Procedure: LUMBAR LAMINECTOMY/DECOMPRESSION MICRODISCECTOMY;  Surgeon: Ophelia Charter;  Location: [facility];  Service: Neurosurgery;  Laterality: N/A;  LUMBAR THREE-FOUR ,FOUR-FIVE LAMINECTOMIES  . TYMPANOMASTOIDECTOMY     Left modified radical with type 3 tympanoplasty     Current Outpatient Prescriptions  Medication Sig Dispense Refill  . acetaminophen (TYLENOL) 325 MG tablet Take 650 mg by mouth every 6 (six) hours as needed for pain.    [name] apixaban (ELIQUIS) 5 MG TABS tablet Take 1 tablet (5 mg total) by mouth 2 (two) times daily. 180 tablet 1  . lisinopril (PRINIVIL,ZESTRIL) 20 MG tablet Take 1 tablet (20 mg total) by mouth daily. 180 tablet 1   No current facility-administered medications for this visit.     Allergies:   Review of patient's allergies indicates no known allergies.    Social History:  The patient  reports that he has quit smoking. He has never used smokeless tobacco. He reports that he does not drink alcohol or use drugs.   Family History:  The patient's family history includes Appendicitis in his father; Diabetes in his sister; Lung cancer in his mother.    ROS:  General:no colds or fevers, no weight changes Skin:no rashes or ulcers HEENT:no blurred vision, no congestion CV:see HPI PUL:see HPI GI:no diarrhea constipation or melena, no indigestion GU:no hematuria, no dysuria MS:no joint pain, no claudication Neuro:no syncope, no lightheadedness Endo:no diabetes, no thyroid disease  Wt Readings from Last 3 Encounters:  12/12/15 195 lb (88.5 kg)  08/13/15 177 lb 6.4 oz (80.5 kg)  02/05/15 182 lb 14.4 oz (83 kg)     PHYSICAL EXAM: VS:  BP (!) 144/76   Pulse (!) 51   Ht 5'  10" (1.778 m)   Wt 195 lb (88.5 kg)   SpO2 96%   BMI 27.98 kg/m  , BMI Body mass index is 27.98 kg/m. General:Pleasant affect, NAD Skin:Warm and dry, brisk capillary refill HEENT:normocephalic, sclera clear, mucus membranes moist Neck:supple, no JVD, no bruits  Heart:S1S2 RRR without murmur, gallup, rub or click Lungs with few rales,  No rhonchi, or wheezes [id number], non tender, + BS, do not palpate liver spleen or masses Ext:no lower ext edema, 2+ pedal pulses, 2+ radial pulses Neuro:alert and oriented X 3, MAE, follows commands, + facial symmetry    EKG:  EKG NOT ordered today. Review of EKGs from ER with a fib with rate control.  Though overall higher rate than previous.    Recent Labs: 12/08/2015: B Natriuretic Peptide 481.5; BUN 24; Creatinine, Ser 1.30; Hemoglobin 12.8; Platelets 133; Potassium 3.9; Sodium 137    Lipid Panel No results found for: CHOL, TRIG, HDL, CHOLHDL, VLDL, LDLCALC, LDLDIRECT     Other studies Reviewed: Additional studies/ records that were reviewed today include: echo from 2013 Study Conclusions  - Left ventricle: The cavity size was normal. Wall thickness was increased in a pattern of mild LVH. Systolic function was mildly to moderately reduced. The estimated ejection fraction was in the range of 40% to 45%. Diffuse hypokinesis, worse in the posterior wall. Doppler parameters are consistent with abnormal left ventricular relaxation (grade 1 diastolic dysfunction). - Aortic valve: There was no stenosis. Mild regurgitation. - Mitral valve: Mild regurgitation. - Left atrium: The atrium was mildly dilated. - Right ventricle: The cavity size was normal. Systolic function was normal. - Tricuspid valve: Peak RV-RA gradient: 85mm Hg (S). - Pulmonary arteries: PA peak pressure: 28mm Hg (S). - Inferior vena cava: The vessel was normal in size; the respirophasic diameter changes were in the normal range (= 50%); findings are  consistent with normal central venous pressure. - Pericardium, extracardiac: There was a pleural effusion. Impressions:  - Normal LV size with mild LV hypertrophy. EF 40-45% with diffuse hypokinesis, worse in the posterior wall. Normal RV size and systolic function. Mild AI and mild MR.  Exercise Capacity:  Lexiscan with low level exercise. BP Response:  Normal blood pressure response. Clinical Symptoms:  No significant symptoms noted. ECG Impression:  Insignificant upsloping ST segment depression. Comparison with Prior Nuclear Study: No images to compare  Overall Impression:  Normal stress nuclear study. There is a very small fixed defect at the base of the inferior wall which is likely diaphragmatic attenuation.  LV Ejection Fraction: 51%.  LV Wall Motion:  EF low normal. No regional wall motion abnormalities.   2 V CXR: COMPARISON:  October 10, 2012  FINDINGS: There are small pleural effusions bilaterally. There is mild bibasilar atelectasis. There is no [place] edema or consolidation. Heart is mildly  enlarged with pulmonary vascularity within normal limits. There is atherosclerotic calcification in the aortic arch. No adenopathy. There is mild degenerative change in the thoracic spine.  IMPRESSION: Mild cardiomegaly with small pleural effusions. There may be a degree of congestive heart failure. No pulmonary edema evident, however. There is mild bibasilar atelectasis. There is aortic atherosclerosis.  ASSESSMENT AND PLAN:  1.  CHF- on ER visit BNP was elevated at 481 and CXR with sm. Pl effusions, degree of CHF.  + DOE. Wt is up from 177 to 195, will add lasix 40 mg daily and K+ 20 meq daily and recheck Pt on the 29th -next week for further eval.  Plan for Echo as well.    -- pt prefers to take as few meds as possible.  Hope to wean of lasix once he has improved.    2.  A fib permanent stable  3.  Chronic anticoagulation on Eliquis.   Follow up next week to  re-eval with me.   Current medicines are reviewed with the patient today.  The patient Has no concerns regarding medicines.  The following changes have been made:  See above Labs/ tests ordered today include:see above  Disposition:   FU:  see above  Signed, Cecilie Kicks, NP  12/12/2015 11:05 AM    [place], [place], [place], [place] Phone: [phone]; Fax: [phone]

## 2015-12-12 ENCOUNTER — Ambulatory Visit (INDEPENDENT_AMBULATORY_CARE_PROVIDER_SITE_OTHER): Payer: Medicare Other | Admitting: Cardiology

## 2015-12-12 ENCOUNTER — Encounter: Payer: Self-pay | Admitting: Cardiology

## 2015-12-12 VITALS — BP 144/76 | HR 51 | Ht 70.0 in | Wt 195.0 lb

## 2015-12-12 DIAGNOSIS — I482 Chronic atrial fibrillation, unspecified: Secondary | ICD-10-CM

## 2015-12-12 DIAGNOSIS — I10 Essential (primary) hypertension: Secondary | ICD-10-CM

## 2015-12-12 DIAGNOSIS — Z7901 Long term (current) use of anticoagulants: Secondary | ICD-10-CM

## 2015-12-12 DIAGNOSIS — R0602 Shortness of breath: Secondary | ICD-10-CM

## 2015-12-12 MED ORDER — POTASSIUM CHLORIDE CRYS ER 20 MEQ PO TBCR: 20.0000 meq | EXTENDED_RELEASE_TABLET | Freq: Every day | ORAL | 6 refills | Status: DC

## 2015-12-12 MED ORDER — FUROSEMIDE 40 MG PO TABS: 40.0000 mg | ORAL_TABLET | Freq: Every day | ORAL | 6 refills | Status: DC

## 2015-12-12 NOTE — Patient Instructions (Signed)
Start new prescriptions given today for furosemide and k-dur ( potassium) these prescriptions has been sent to your [facility] on [address].   Your physician has requested that you have an echocardiogram. Echocardiography is a painless test that uses sound waves to create images of your heart. It provides your doctor with information about the size and shape of your heart and how well your heart's chambers and valves are working. This procedure takes approximately one hour. There are no restrictions for this procedure.  Your physician recommends that you schedule a follow-up appointment in: on Tuesday 12/17/15 with Mickel Baas.

## 2015-12-13 ENCOUNTER — Encounter: Payer: Self-pay | Admitting: Cardiology

## 2015-12-13 ENCOUNTER — Telehealth: Payer: Self-pay

## 2015-12-13 ENCOUNTER — Ambulatory Visit (HOSPITAL_COMMUNITY)
Admission: RE | Admit: 2015-12-13 | Discharge: 2015-12-13 | Disposition: A | Discharge status: Home / Self Care | Payer: Medicare Other | Source: Ambulatory Visit | Attending: Cardiology | Admitting: Cardiology

## 2015-12-13 DIAGNOSIS — I4891 Unspecified atrial fibrillation: Secondary | ICD-10-CM | POA: Insufficient documentation

## 2015-12-13 DIAGNOSIS — I509 Heart failure, unspecified: Secondary | ICD-10-CM | POA: Insufficient documentation

## 2015-12-13 DIAGNOSIS — R931 Abnormal findings on diagnostic imaging of heart and coronary circulation: Secondary | ICD-10-CM

## 2015-12-13 DIAGNOSIS — I110 Hypertensive heart disease with heart failure: Secondary | ICD-10-CM | POA: Insufficient documentation

## 2015-12-13 DIAGNOSIS — R0602 Shortness of breath: Secondary | ICD-10-CM | POA: Diagnosis not present

## 2015-12-13 DIAGNOSIS — I351 Nonrheumatic aortic (valve) insufficiency: Secondary | ICD-10-CM | POA: Diagnosis not present

## 2015-12-13 DIAGNOSIS — I340 Nonrheumatic mitral (valve) insufficiency: Secondary | ICD-10-CM | POA: Insufficient documentation

## 2015-12-13 DIAGNOSIS — Z87891 Personal history of nicotine dependence: Secondary | ICD-10-CM | POA: Diagnosis not present

## 2015-12-13 NOTE — Progress Notes (Signed)
  Echocardiogram 2D Echocardiogram has been performed.  Tresa Res 12/13/2015, 11:15 AM

## 2015-12-13 NOTE — Telephone Encounter (Signed)
-----   Message from Isaiah Serge, NP sent at 12/13/2015  2:18 PM EDT ----- Please arrange lexiscan myoview for abnormal echo.  His pump action is weaker.  Would like to do the [facility] as soon as possible.  I did review this with Dr. Stanford Breed and this was his recommendation.  Pt is to see me back next week.  I had added lasix yesterday which should help him.

## 2015-12-16 NOTE — Telephone Encounter (Signed)
Returned call to patient.Echo results given.Advised scheduler will call back to schedule Lexiscan to be done soon.Advised to keep appointment with Cecilie Kicks NP tomorrow 12/17/15 at 2:00 pm.

## 2015-12-16 NOTE — Telephone Encounter (Signed)
F/U   Ralph Mendoza is returning a call . Please call

## 2015-12-16 NOTE — Telephone Encounter (Signed)
F/U  Patient is returning a call from Friday afternoon regarding his ECHO results.

## 2015-12-16 NOTE — Progress Notes (Signed)
Cardiology Office Note   Date:  12/17/2015   ID:  Ralph Mendoza, DOB [birth year], MRN [id number]  PCP:  [name], MD  Cardiologist:  Dr. Stanford Breed     Chief Complaint  Patient presents with  . Congestive Heart Failure      History of Present Illness: Ralph Mendoza is a 82 y.o. male who presents for CHF follow up.  Was seen last week with CHF after ER visit with elevated BNP and CXR with mild CHF.  Echo with new decrease in EF to 25-30%   PA pk pressure 56 mmHg.  (previous EF 40-45%)  Discussed with Dr. Stanford Breed and pt should have lexiscan myoview.  We have scheduled.      He has a hx. Of atrial fibrillation. Myoview in June of 2013 showed diaphragmatic attenuation but no ischemia. Ejection fraction was 51%. We have elected rate control and anticoagulation for afib. Holter monitor in February of 2014 showed sinus rhythm, mobitz 1; toprol DCed. Echocardiogram in June of 2014 showed an ejection fraction of 40-45%. There was mild aortic and mitral regurgitation. There was mild left atrial enlargement. There was a pleural effusion. Patient seen in December of 2014 by Dr. Mare Ferrari with dizziness. Monitor showed atrial fibrillation. [place] off a chair while changing light in January 2017. Since last seen, He denies dyspnea, chest pain, palpitations, syncope or bleeding.  Today he does not admit to passing more urine but he is down 9 lbs.  He has more energy and less SOB.  He denies chest pain.     Past Medical History:  Diagnosis Date  . Atrial fibrillation ([place])   . BPH (benign prostatic hyperplasia)   . Dyspnea on exertion    a. 09/2008 [facility] EF on myoview.  [name] Hearing difficulty    R sided hole in eardrum, only wears L hearing aid  . History of tobacco abuse   . Hypertension   . Lumbar stenosis with neurogenic claudication    a. 02/2011 s/p decompressive laminectomy.  . Midsternal chest pain    a. 09/2008 Myoview: EF 56%, mild fixed thinning of inf wall particularly @ base -?  attenuation.  No ischemia  . PUD (peptic ulcer disease)    Remote, history of  . Urinary hesitancy     Past Surgical History:  Procedure Laterality Date  . HERNIA REPAIR     Left  . LUMBAR LAMINECTOMY/DECOMPRESSION MICRODISCECTOMY  03/04/2011   Procedure: LUMBAR LAMINECTOMY/DECOMPRESSION MICRODISCECTOMY;  Surgeon: Ophelia Charter;  Location: [facility];  Service: Neurosurgery;  Laterality: N/A;  LUMBAR THREE-FOUR ,FOUR-FIVE LAMINECTOMIES  . TYMPANOMASTOIDECTOMY     Left modified radical with type 3 tympanoplasty     Current Outpatient Prescriptions  Medication Sig Dispense Refill  . acetaminophen (TYLENOL) 325 MG tablet Take 650 mg by mouth every 6 (six) hours as needed for pain.    [name] apixaban (ELIQUIS) 5 MG TABS tablet Take 1 tablet (5 mg total) by mouth 2 (two) times daily. 180 tablet 1  . furosemide (LASIX) 40 MG tablet Take 1 tablet (40 mg total) by mouth daily. 30 tablet 6  . lisinopril (PRINIVIL,ZESTRIL) 20 MG tablet Take 1 tablet (20 mg total) by mouth daily. 180 tablet 1  . potassium chloride SA (K-DUR,KLOR-CON) 20 MEQ tablet Take 1 tablet (20 mEq total) by mouth daily. 30 tablet 6   No current facility-administered medications for this visit.     Allergies:   Review of patient's allergies indicates no known allergies.  Social History:  The patient  reports that he has quit smoking. He has never used smokeless tobacco. He reports that he does not drink alcohol or use drugs.   Family History:  The patient's family history includes Appendicitis in his father; Diabetes in his sister; Lung cancer in his mother.    ROS:  General:no colds or fevers, no weight changes Skin:no rashes or ulcers HEENT:no blurred vision, no congestion CV:see HPI PUL:see HPI GI:no diarrhea constipation or melena, no indigestion GU:no hematuria, no dysuria MS:no joint pain, no claudication Neuro:no syncope, no lightheadedness Endo:no diabetes, no thyroid disease  Wt Readings from Last  3 Encounters:  12/17/15 186 lb 12.8 oz (84.7 kg)  12/12/15 195 lb (88.5 kg)  08/13/15 177 lb 6.4 oz (80.5 kg)     PHYSICAL EXAM: VS:  BP (!) 146/62 (BP Location: Right Arm, Patient Position: Sitting, Cuff Size: Normal)   Pulse (!) 48   Ht 5' 10.5" (1.791 m)   Wt 186 lb 12.8 oz (84.7 kg)   SpO2 95%   BMI 26.42 kg/m  , BMI Body mass index is 26.42 kg/m. General:Pleasant affect, NAD Skin:Warm and dry, brisk capillary refill HEENT:normocephalic, sclera clear, mucus membranes moist Neck:supple, no JVD, no bruits  Heart:S1S2 irreg irreg without murmur, gallup, rub or click Lungs:clear without rales, rhonchi, or wheezes [id number], non tender, + BS, do not palpate liver spleen or masses Ext:no lower ext edema, 2+ pedal pulses, 2+ radial pulses Neuro:alert and oriented X 3, MAE, follows commands, + facial symmetry    EKG:  EKG is NOT ordered today.   Recent Labs: 12/08/2015: B Natriuretic Peptide 481.5; BUN 24; Creatinine, Ser 1.30; Hemoglobin 12.8; Platelets 133; Potassium 3.9; Sodium 137    Lipid Panel No results found for: CHOL, TRIG, HDL, CHOLHDL, VLDL, LDLCALC, LDLDIRECT     Other studies Reviewed: Additional studies/ records that were reviewed today include: . ECHO: Study Conclusions  - Left ventricle: The cavity size was normal. Systolic function was   severely reduced. The estimated ejection fraction was in the   range of 25% to 30%. Diffuse hypokinesis. There was no evidence   of elevated ventricular filling pressure by Doppler parameters. - Aortic valve: Trileaflet; mildly thickened, mildly calcified   leaflets. There was trivial regurgitation. - Mitral valve: There was moderate regurgitation. - Left atrium: The atrium was moderately dilated. - Right ventricle: The cavity size was mildly dilated. Wall   thickness was normal. - Right atrium: The atrium was moderately dilated. - Pulmonary arteries: Systolic pressure was moderately increased.   PA peak pressure:  56 mm Hg (S).  Impressions:  - EF is decreased when compared to prior study (40-45%).  ASSESSMENT AND PLAN:  1.  CHF- on ER visit BNP was elevated at 481 and CXR with sm. Pl effusions, degree of CHF.  + DOE. Wt is up from 177 to 195, will add lasix 40 mg daily and K+ 20 meq daily and recheck Pt on the 29th -Echo with drop in EF 25-30%.  Pt for nuc study.  I discussed with Dr. Stanford Breed we hope he will not need cath at this point.   Wt is down 9 lbs     -- pt prefers to take as few meds as possible.  Hope to wean of lasix once he has improved.    2.  A fib permanent stable  3.  Chronic anticoagulation on Eliquis.    Current medicines are reviewed with the patient today.  The patient Has no  concerns regarding medicines.  The following changes have been made:  See above Labs/ tests ordered today include:see above  Disposition:   FU:  see above  Signed, Cecilie Kicks, NP  12/17/2015 3:03 PM    [facility] [place], [place], [place] Phone: [phone]; Fax: [phone]

## 2015-12-17 ENCOUNTER — Ambulatory Visit (INDEPENDENT_AMBULATORY_CARE_PROVIDER_SITE_OTHER): Payer: Medicare Other | Admitting: Cardiology

## 2015-12-17 ENCOUNTER — Encounter: Payer: Self-pay | Admitting: Cardiology

## 2015-12-17 VITALS — BP 146/62 | HR 48 | Ht 70.5 in | Wt 186.8 lb

## 2015-12-17 DIAGNOSIS — I5023 Acute on chronic systolic (congestive) heart failure: Secondary | ICD-10-CM

## 2015-12-17 DIAGNOSIS — R0602 Shortness of breath: Secondary | ICD-10-CM | POA: Diagnosis not present

## 2015-12-17 DIAGNOSIS — N289 Disorder of kidney and ureter, unspecified: Secondary | ICD-10-CM

## 2015-12-17 DIAGNOSIS — I482 Chronic atrial fibrillation, unspecified: Secondary | ICD-10-CM

## 2015-12-17 DIAGNOSIS — I10 Essential (primary) hypertension: Secondary | ICD-10-CM

## 2015-12-17 DIAGNOSIS — Z7901 Long term (current) use of anticoagulants: Secondary | ICD-10-CM

## 2015-12-17 DIAGNOSIS — R52 Pain, unspecified: Secondary | ICD-10-CM

## 2015-12-17 DIAGNOSIS — I429 Cardiomyopathy, unspecified: Secondary | ICD-10-CM | POA: Diagnosis not present

## 2015-12-17 DIAGNOSIS — R300 Dysuria: Secondary | ICD-10-CM

## 2015-12-17 LAB — BASIC METABOLIC PANEL WITH GFR
BUN: 20 mg/dL (ref 7–25)
CHLORIDE: 101 mmol/L (ref 98–110)
CO2: 30 mmol/L (ref 20–31)
CREATININE: 1.5 mg/dL — AB (ref 0.70–1.11)
Calcium: 9.2 mg/dL (ref 8.6–10.3)
GFR, Est African American: 46 mL/min — ABNORMAL LOW (ref >=60)
GFR, Est Non African American: 40 mL/min — ABNORMAL LOW (ref >=60)
Glucose, Bld: 107 mg/dL — ABNORMAL HIGH (ref 65–99)
POTASSIUM: 3.8 mmol/L (ref 3.5–5.3)
Sodium: 140 mmol/L (ref 135–146)

## 2015-12-17 NOTE — Patient Instructions (Signed)
Labwork  Your physician recommends that you return for lab work: Urinalysis, BMP   Follow-Up  Your physician recommends that you schedule a follow-up appointment the week after September 7 with Cecilie Kicks, NP or Dr. Stanford Breed.  If you need a refill on your cardiac medications before your next appointment, please call your pharmacy.

## 2015-12-18 ENCOUNTER — Telehealth: Payer: Self-pay | Admitting: *Deleted

## 2015-12-18 DIAGNOSIS — I10 Essential (primary) hypertension: Secondary | ICD-10-CM

## 2015-12-18 LAB — URINALYSIS
Bilirubin Urine: NEGATIVE
Glucose, UA: NEGATIVE
Hgb urine dipstick: NEGATIVE
KETONES UR: NEGATIVE
Leukocytes, UA: NEGATIVE
NITRITE: NEGATIVE
PH: 6.5 (ref 5.0–8.0)
Protein, ur: NEGATIVE
SPECIFIC GRAVITY, URINE: 1.009 (ref 1.001–1.035)

## 2015-12-18 MED ORDER — FUROSEMIDE 40 MG PO TABS: 20.0000 mg | ORAL_TABLET | Freq: Every day | ORAL | 3 refills | Status: DC

## 2015-12-18 MED ORDER — POTASSIUM CHLORIDE CRYS ER 20 MEQ PO TBCR: 10.0000 meq | EXTENDED_RELEASE_TABLET | Freq: Every day | ORAL | 3 refills | Status: DC

## 2015-12-18 NOTE — Telephone Encounter (Signed)
Pt aware of lab results.  He will decrease lasix to 20 mg daily and potassium 10 meq daily. He will repeat bmet 12/26/15 when he goes to [facility] for stress test. Order in [facility]. Pt verbalized understanding.

## 2015-12-18 NOTE — Telephone Encounter (Signed)
-----   Message from Ralph Serge, NP sent at 12/18/2015  9:40 AM EDT ----- Let pt know his urine had no infection and no blood, normal urinalysis.  Have him decrease lasix to 20 mg daily and Kdur to 10 meq daily.   He has nuc study next week.  Recheck BMP when he comes in for nuc.  Thanks.  If he becomes more SOB then he should call and we would adjust meds.

## 2015-12-24 ENCOUNTER — Telehealth (HOSPITAL_COMMUNITY): Payer: Self-pay

## 2015-12-24 NOTE — Telephone Encounter (Signed)
Encounter complete. 

## 2015-12-26 ENCOUNTER — Ambulatory Visit (HOSPITAL_COMMUNITY)
Admission: RE | Admit: 2015-12-26 | Discharge: 2015-12-26 | Disposition: A | Discharge status: Home / Self Care | Payer: Medicare Other | Source: Ambulatory Visit | Attending: Cardiology | Admitting: Cardiology

## 2015-12-26 DIAGNOSIS — I429 Cardiomyopathy, unspecified: Secondary | ICD-10-CM | POA: Diagnosis not present

## 2015-12-26 DIAGNOSIS — R5383 Other fatigue: Secondary | ICD-10-CM | POA: Insufficient documentation

## 2015-12-26 DIAGNOSIS — I509 Heart failure, unspecified: Secondary | ICD-10-CM | POA: Insufficient documentation

## 2015-12-26 DIAGNOSIS — I110 Hypertensive heart disease with heart failure: Secondary | ICD-10-CM | POA: Insufficient documentation

## 2015-12-26 DIAGNOSIS — I4891 Unspecified atrial fibrillation: Secondary | ICD-10-CM

## 2015-12-26 DIAGNOSIS — R0602 Shortness of breath: Secondary | ICD-10-CM | POA: Insufficient documentation

## 2015-12-26 DIAGNOSIS — Z87891 Personal history of nicotine dependence: Secondary | ICD-10-CM | POA: Diagnosis not present

## 2015-12-26 DIAGNOSIS — R931 Abnormal findings on diagnostic imaging of heart and coronary circulation: Secondary | ICD-10-CM | POA: Diagnosis not present

## 2015-12-26 DIAGNOSIS — R55 Syncope and collapse: Secondary | ICD-10-CM | POA: Diagnosis not present

## 2015-12-26 DIAGNOSIS — R0609 Other forms of dyspnea: Secondary | ICD-10-CM | POA: Diagnosis not present

## 2015-12-26 LAB — MYOCARDIAL PERFUSION IMAGING
CHL CUP NUCLEAR SSS: 2
CSEPPHR: 77 {beats}/min
LVDIAVOL: 191 mL (ref 62–150)
LVSYSVOL: 138 mL
Rest HR: 67 {beats}/min
SDS: 0
SRS: 2
TID: 1.12

## 2015-12-26 MED ORDER — TECHNETIUM TC 99M TETROFOSMIN IV KIT
31.9000 | PACK | Freq: Once | INTRAVENOUS | Status: AC | PRN
  Administered 2015-12-26: 31.9 via INTRAVENOUS
  Filled 2015-12-26: qty 32

## 2015-12-26 MED ORDER — REGADENOSON 0.4 MG/5ML IV SOLN
0.4000 mg | Freq: Once | INTRAVENOUS | Status: AC
  Administered 2015-12-26: 0.4 mg via INTRAVENOUS

## 2015-12-26 MED ORDER — TECHNETIUM TC 99M TETROFOSMIN IV KIT
10.9000 | PACK | Freq: Once | INTRAVENOUS | Status: AC | PRN
  Administered 2015-12-26: 10.9 via INTRAVENOUS
  Filled 2015-12-26: qty 11

## 2015-12-26 MED ORDER — AMINOPHYLLINE 25 MG/ML IV SOLN
75.0000 mg | Freq: Once | INTRAVENOUS | Status: AC
  Administered 2015-12-26: 75 mg via INTRAVENOUS

## 2015-12-31 ENCOUNTER — Encounter: Payer: Self-pay | Admitting: Cardiology

## 2016-01-02 ENCOUNTER — Ambulatory Visit (INDEPENDENT_AMBULATORY_CARE_PROVIDER_SITE_OTHER): Payer: Medicare Other | Admitting: Cardiology

## 2016-01-02 ENCOUNTER — Encounter: Payer: Self-pay | Admitting: Cardiology

## 2016-01-02 VITALS — BP 140/70 | HR 80 | Ht 70.0 in | Wt 185.8 lb

## 2016-01-02 DIAGNOSIS — I482 Chronic atrial fibrillation, unspecified: Secondary | ICD-10-CM

## 2016-01-02 DIAGNOSIS — I10 Essential (primary) hypertension: Secondary | ICD-10-CM

## 2016-01-02 DIAGNOSIS — I429 Cardiomyopathy, unspecified: Secondary | ICD-10-CM | POA: Diagnosis not present

## 2016-01-02 DIAGNOSIS — R931 Abnormal findings on diagnostic imaging of heart and coronary circulation: Secondary | ICD-10-CM | POA: Diagnosis not present

## 2016-01-02 DIAGNOSIS — Z7901 Long term (current) use of anticoagulants: Secondary | ICD-10-CM

## 2016-01-02 DIAGNOSIS — R0602 Shortness of breath: Secondary | ICD-10-CM

## 2016-01-02 DIAGNOSIS — N289 Disorder of kidney and ureter, unspecified: Secondary | ICD-10-CM

## 2016-01-02 MED ORDER — ISOSORBIDE MONONITRATE ER 30 MG PO TB24: 30.0000 mg | ORAL_TABLET | Freq: Every day | ORAL | 3 refills | Status: DC

## 2016-01-02 MED ORDER — FUROSEMIDE 40 MG PO TABS: 40.0000 mg | ORAL_TABLET | Freq: Every day | ORAL | 3 refills | Status: DC

## 2016-01-02 NOTE — Progress Notes (Signed)
Pt has ov  Today with Cecilie Kicks, NP will be discussed today.

## 2016-01-02 NOTE — Patient Instructions (Addendum)
Medication Instructions:  Your physician has recommended you make the following change in your medication:   1) START Imdur 30 mg daily 2) INCREASE Lasix to 40 mg daily    Labwork: BMET in 1 week  Testing/Procedures: None Ordered   Follow-Up: Your physician recommends that you schedule a follow-up appointment in: 4 weeks with Dr. Stanford Breed (appointment must be with Dr. Stanford Breed)   Any Other Special Instructions Will Be Listed Below (If Applicable).     If you need a refill on your cardiac medications before your next appointment, please call your pharmacy.

## 2016-01-02 NOTE — Progress Notes (Signed)
Cardiology Office Note   Date:  01/02/2016   ID:  Ralph Mendoza, DOB 1920/08/13, MRN [id number]  PCP:  [name], MD  Cardiologist:  Dr. Stanford Breed     Chief Complaint  Patient presents with  . Shortness of Breath      History of Present Illness: Ralph Mendoza is a 82 y.o. male who presents for follow up of nuc study.    Was seen last week with CHF after ER visit with elevated BNP and CXR with mild CHF.  Echo with new decrease in EF to 25-30%   PA pk pressure 56 mmHg.  (previous EF 40-45%)  Discussed with Dr. Stanford Breed and we schecduled nuc study to eval for ischemia.    He has a hx. Of atrial fibrillation. Myoview in June of 2013 showed diaphragmatic attenuation but no ischemia. Ejection fraction was 51%. We have elected rate control and anticoagulation for afib. Holter monitor in February of 2014 showed sinus rhythm, mobitz 1; toprol DCed. Echocardiogram in June of 2014 showed an ejection fraction of 40-45%. There was mild aortic and mitral regurgitation. There was mild left atrial enlargement. There was a pleural effusion. Patient seen in December of 2014 by Dr. Mare Ferrari with dizziness. Monitor showed atrial fibrillation. Golden Circle off a chair while changing light in January 2017. Since last seen, He denies dyspnea, chest pain, palpitations, syncope or bleeding.  On last visit he did not admit to passing more urine but he was down 9 lbs.  He had more energy and less SOB.  He denied chest pain.  with decrease in EF we did nuc study and he had mild ischemia.  EF continued to be low.  With his age and mild ischemia only will treat medically.    Today pt with increased SOB.  Additionally not sleeping well at assisted living, wakes up when they come to help his wife.  Wt is stable.     Past Medical History:  Diagnosis Date  . Atrial fibrillation ([place])   . BPH (benign prostatic hyperplasia)   . Dyspnea on exertion    a. 09/2008 [facility] EF on myoview.  [name] Hearing difficulty    R sided  hole in eardrum, only wears L hearing aid  . History of tobacco abuse   . Hypertension   . Lumbar stenosis with neurogenic claudication    a. 02/2011 s/p decompressive laminectomy.  . Midsternal chest pain    a. 09/2008 Myoview: EF 56%, mild fixed thinning of inf wall particularly @ base -? attenuation.  No ischemia  . PUD (peptic ulcer disease)    Remote, history of  . Urinary hesitancy     Past Surgical History:  Procedure Laterality Date  . HERNIA REPAIR     Left  . LUMBAR LAMINECTOMY/DECOMPRESSION MICRODISCECTOMY  03/04/2011   Procedure: LUMBAR LAMINECTOMY/DECOMPRESSION MICRODISCECTOMY;  Surgeon: Ophelia Charter;  Location: [facility];  Service: Neurosurgery;  Laterality: N/A;  LUMBAR THREE-FOUR ,FOUR-FIVE LAMINECTOMIES  . TYMPANOMASTOIDECTOMY     Left modified radical with type 3 tympanoplasty     Current Outpatient Prescriptions  Medication Sig Dispense Refill  . acetaminophen (TYLENOL) 325 MG tablet Take 650 mg by mouth every 6 (six) hours as needed for pain.    [name] apixaban (ELIQUIS) 5 MG TABS tablet Take 1 tablet (5 mg total) by mouth 2 (two) times daily. 180 tablet 1  . furosemide (LASIX) 40 MG tablet Take 0.5 tablets (20 mg total) by mouth daily. 45 tablet 3  . lisinopril (PRINIVIL,ZESTRIL)  20 MG tablet Take 1 tablet (20 mg total) by mouth daily. 180 tablet 1  . potassium chloride SA (K-DUR,KLOR-CON) 20 MEQ tablet Take 0.5 tablets (10 mEq total) by mouth daily. 45 tablet 3   No current facility-administered medications for this visit.     Allergies:   Review of patient's allergies indicates no known allergies.    Social History:  The patient  reports that he has quit smoking. He has never used smokeless tobacco. He reports that he does not drink alcohol or use drugs.   Family History:  The patient's family history includes Appendicitis in his father; Diabetes in his sister; Lung cancer in his mother.    ROS:  General:no colds or fevers, no weight changes Skin:no  rashes or ulcers HEENT:no blurred vision, no congestion CV:see HPI PUL:see HPI GI:no diarrhea constipation or melena, no indigestion GU:no hematuria, no dysuria MS:no joint pain, no claudication Neuro:no syncope, no lightheadedness Endo:no diabetes, no thyroid disease  Wt Readings from Last 3 Encounters:  01/02/16 185 lb 12.8 oz (84.3 kg)  12/26/15 186 lb (84.4 kg)  12/17/15 186 lb 12.8 oz (84.7 kg)     PHYSICAL EXAM: VS:  BP 140/70   Pulse 80   Ht 5\' 10"  (1.778 m)   Wt 185 lb 12.8 oz (84.3 kg)   BMI 26.66 kg/m  , BMI Body mass index is 26.66 kg/m. General:Pleasant affect, NAD Skin:Warm and dry, brisk capillary refill HEENT:normocephalic, sclera clear, mucus membranes moist Neck:supple, no JVD, no bruits  Heart:ireg irreg without murmur, gallup, rub or click Lungs: with rales, no rhonchi, or wheezes [id number], non tender, + BS, do not palpate liver spleen or masses Ext:no lower ext edema,  2+ radial pulses Neuro:alert and oriented X 3, MAE, follows commands, + facial symmetry    EKG:  EKG is ordered today. The ekg ordered today demonstrates a fib rate controlled.  No acute changes.     Recent Labs: 12/08/2015: B Natriuretic Peptide 481.5; Hemoglobin 12.8; Platelets 133 12/17/2015: BUN 20; Creat 1.50; Potassium 3.8; Sodium 140    Lipid Panel No results found for: CHOL, TRIG, HDL, CHOLHDL, VLDL, LDLCALC, LDLDIRECT     Other studies Reviewed: Additional studies/ records that were reviewed today include: . Echo:  12/13/15 Study Conclusions  - Left ventricle: The cavity size was normal. Systolic function was   severely reduced. The estimated ejection fraction was in the   range of 25% to 30%. Diffuse hypokinesis. There was no evidence   of elevated ventricular filling pressure by Doppler parameters. - Aortic valve: Trileaflet; mildly thickened, mildly calcified   leaflets. There was trivial regurgitation. - Mitral valve: There was moderate regurgitation. - Left  atrium: The atrium was moderately dilated. - Right ventricle: The cavity size was mildly dilated. Wall   thickness was normal. - Right atrium: The atrium was moderately dilated. - Pulmonary arteries: Systolic pressure was moderately increased.   PA peak pressure: 56 mm Hg (S).  Impressions:  - EF is decreased when compared to prior study (40-45%).  myoview 12/26/15 Study Highlights    The left ventricular ejection fraction is severely decreased (<30%).  Nuclear stress EF: 28%.  There was no ST segment deviation noted during stress.  Defect 1: There is a small defect of mild severity present in the apex location.  This is a high risk study.      ASSESSMENT AND PLAN:  1. CHF- on ER visit BNP was elevated at 481 and CXR with sm. Pl effusions, degree  of CHF. + DOE. Wt was up from 177 to 195, added lasix 40 mg daily and K+ 20 meq daily and -Echo with drop in EF 25-30%.  Ffollow up visit Wt was down 9 lbs  cr. Had increased so I decreased lasix.  nuc with only mild ischemia.  Today increase of SOB and rales.  Will increase the lasix back to 40 mg daily, he is on ACE, and will add imdur 30 mg daily.   I worry that a component of this is new living environment he is not resting well.  He will follow up with Dr. Stanford Breed in 3-4 weeks.  Pt's son in law was with him today.     2. A fib permanent stable  3. Chronic anticoagulation on Eliquis.   4. CKD-3   Current medicines are reviewed with the patient today.  The patient Has no concerns regarding medicines.  The following changes have been made:  See above Labs/ tests ordered today include:see above  Disposition:   FU:  see above  Signed, Cecilie Kicks, NP  01/02/2016 2:03 PM    [facility], [place], [place], [place] Phone: [phone]; Fax: [phone]

## 2016-01-09 ENCOUNTER — Other Ambulatory Visit: Payer: Medicare Other | Admitting: *Deleted

## 2016-01-09 ENCOUNTER — Telehealth: Payer: Self-pay | Admitting: Cardiology

## 2016-01-09 DIAGNOSIS — I429 Cardiomyopathy, unspecified: Secondary | ICD-10-CM

## 2016-01-09 LAB — BASIC METABOLIC PANEL
BUN: 16 mg/dL (ref 7–25)
CALCIUM: 9 mg/dL (ref 8.6–10.3)
CO2: 27 mmol/L (ref 20–31)
Chloride: 100 mmol/L (ref 98–110)
Creat: 1.46 mg/dL — ABNORMAL HIGH (ref 0.70–1.11)
Glucose, Bld: 112 mg/dL — ABNORMAL HIGH (ref 65–99)
Potassium: 3.6 mmol/L (ref 3.5–5.3)
SODIUM: 139 mmol/L (ref 135–146)

## 2016-01-10 NOTE — Telephone Encounter (Signed)
Spoke with pt son-n law, patients wife died earlier this week. They checked his blood pressure yesterday and everything was fine.

## 2016-01-29 NOTE — Progress Notes (Signed)
HPI: FU atrial fibrillation. Myoview in June of 2013 showed diaphragmatic attenuation but no ischemia. Ejection fraction was 51%. We have elected rate control and anticoagulation for afib. Holter monitor in February of 2014 showed sinus rhythm, mobitz 1; toprol DCed. Echocardiogram August 2017 showed ejection fraction 25-30%, moderate mitral regurgitation, biatrial enlargement and mild right ventricular enlargement. Pulmonary pressure moderately increased. Nuclear study September 2017 showed ejection fraction 28%. No ischemia. Fixed apical defect. Patient seen recently for increased dyspnea and congestive heart failure. Placed on Lasix. Since last seen, patient has no dyspnea, chest pain, palpitations, syncope or bleeding.  Current Outpatient Prescriptions  Medication Sig Dispense Refill  . acetaminophen (TYLENOL) 325 MG tablet Take 650 mg by mouth every 6 (six) hours as needed for pain.    [name] apixaban (ELIQUIS) 5 MG TABS tablet Take 1 tablet (5 mg total) by mouth 2 (two) times daily. 180 tablet 1  . furosemide (LASIX) 40 MG tablet Take 1 tablet (40 mg total) by mouth daily. 90 tablet 3  . isosorbide mononitrate (IMDUR) 30 MG 24 hr tablet Take 1 tablet (30 mg total) by mouth daily. 90 tablet 3  . lisinopril (PRINIVIL,ZESTRIL) 20 MG tablet Take 1 tablet (20 mg total) by mouth daily. 180 tablet 1  . potassium chloride SA (K-DUR,KLOR-CON) 20 MEQ tablet Take 0.5 tablets (10 mEq total) by mouth daily. 45 tablet 3   No current facility-administered medications for this visit.      Past Medical History:  Diagnosis Date  . Atrial fibrillation ([place])   . BPH (benign prostatic hyperplasia)   . Dyspnea on exertion    a. 09/2008 [facility] EF on myoview.  [name] Hearing difficulty    R sided hole in eardrum, only wears L hearing aid  . History of tobacco abuse   . Hypertension   . Lumbar stenosis with neurogenic claudication    a. 02/2011 s/p decompressive laminectomy.  . Midsternal chest pain    a. 09/2008  Myoview: EF 56%, mild fixed thinning of inf wall particularly @ base -? attenuation.  No ischemia  . PUD (peptic ulcer disease)    Remote, history of  . Urinary hesitancy     Past Surgical History:  Procedure Laterality Date  . HERNIA REPAIR     Left  . LUMBAR LAMINECTOMY/DECOMPRESSION MICRODISCECTOMY  03/04/2011   Procedure: LUMBAR LAMINECTOMY/DECOMPRESSION MICRODISCECTOMY;  Surgeon: Ophelia Charter;  Location: [facility];  Service: Neurosurgery;  Laterality: N/A;  LUMBAR THREE-FOUR ,FOUR-FIVE LAMINECTOMIES  . TYMPANOMASTOIDECTOMY     Left modified radical with type 3 tympanoplasty    Social History   Social History  . Marital status: Married    Spouse name: N/A  . Number of children: 2  . Years of education: N/A   Occupational History  . [employer]     Retired   Social History Main Topics  . Smoking status: Former Research scientist (life sciences)  . Smokeless tobacco: Never Used     Comment: Remote history of smoking  . Alcohol use No  . Drug use: No  . Sexual activity: Not on file   Other Topics Concern  . Not on file   Social History Narrative   Lives with his wife in [place].   He swims daily.   He has 2 grown daughters.   1 Caffeine drink daily     Family History  Problem Relation Age of Onset  . Lung cancer Mother   . Appendicitis Father   . Diabetes Sister  DM  . Coronary artery disease Neg Hx     No premature CAD  . Colon cancer Neg Hx     ROS: no fevers or chills, productive cough, hemoptysis, dysphasia, odynophagia, melena, hematochezia, dysuria, hematuria, rash, seizure activity, orthopnea, PND, pedal edema, claudication. Remaining systems are negative.  Physical Exam: Well-developed well-nourished in no acute distress.  Skin is warm and dry.  HEENT is normal.  Neck is supple.  Chest is clear to auscultation with normal expansion.  Cardiovascular exam is regular rate and rhythm.  Abdominal exam nontender or distended. No masses  palpated. Extremities show no edema. neuro grossly intact  A/P  1 Atrial fibrillation-rate is controlled on no medications. Continue apixaban. Check renal function.  2 Hypertension-blood pressure controlled. Continue present medications.  3 cardiomyopathy-etiology unclear. Nuclear study did not suggest coronary disease. I would like to be conservative given his age. Continue ACE inhibitor. I have not added a beta blocker as he has had problems with bradycardia previously.  4 chronic systolic congestive heart failure-continue present dose of Lasix. Check potassium and renal function.  5 hyperlipidemia-management per primary care.   Kirk Ruths, MD

## 2016-01-31 ENCOUNTER — Encounter: Payer: Self-pay | Admitting: Cardiology

## 2016-01-31 ENCOUNTER — Ambulatory Visit (INDEPENDENT_AMBULATORY_CARE_PROVIDER_SITE_OTHER): Payer: Medicare Other | Admitting: Cardiology

## 2016-01-31 VITALS — BP 148/68 | HR 84 | Ht 70.0 in | Wt 180.0 lb

## 2016-01-31 DIAGNOSIS — I420 Dilated cardiomyopathy: Secondary | ICD-10-CM | POA: Diagnosis not present

## 2016-01-31 DIAGNOSIS — I428 Other cardiomyopathies: Secondary | ICD-10-CM

## 2016-01-31 DIAGNOSIS — I4891 Unspecified atrial fibrillation: Secondary | ICD-10-CM

## 2016-01-31 DIAGNOSIS — I10 Essential (primary) hypertension: Secondary | ICD-10-CM | POA: Diagnosis not present

## 2016-01-31 LAB — BASIC METABOLIC PANEL
BUN: 18 mg/dL (ref 7–25)
CHLORIDE: 102 mmol/L (ref 98–110)
CO2: 26 mmol/L (ref 20–31)
Calcium: 9.1 mg/dL (ref 8.6–10.3)
Creat: 1.25 mg/dL — ABNORMAL HIGH (ref 0.70–1.11)
GLUCOSE: 102 mg/dL — AB (ref 65–99)
POTASSIUM: 4 mmol/L (ref 3.5–5.3)
Sodium: 139 mmol/L (ref 135–146)

## 2016-01-31 NOTE — Patient Instructions (Signed)
Medication Instructions:   NO CHANGE  Labwork:  Your physician recommends that you HAVE LAB WORK TODAY  Follow-Up:  Your physician recommends that you schedule a follow-up appointment in: 3 MONTHS WITH DR CRENSHAW      

## 2016-02-01 LAB — BRAIN NATRIURETIC PEPTIDE: Brain Natriuretic Peptide: 342.7 pg/mL — ABNORMAL HIGH (ref <100)

## 2016-03-09 ENCOUNTER — Other Ambulatory Visit: Payer: Self-pay | Admitting: Cardiology

## 2016-03-09 ENCOUNTER — Other Ambulatory Visit: Payer: Self-pay

## 2016-03-09 MED ORDER — LISINOPRIL 20 MG PO TABS: 20.0000 mg | ORAL_TABLET | Freq: Every day | ORAL | 1 refills | Status: DC

## 2016-04-28 ENCOUNTER — Other Ambulatory Visit: Payer: Self-pay | Admitting: Family Medicine

## 2016-04-28 ENCOUNTER — Ambulatory Visit
Admission: RE | Admit: 2016-04-28 | Discharge: 2016-04-28 | Disposition: A | Discharge status: Home / Self Care | Payer: Medicare Other | Source: Ambulatory Visit | Attending: Family Medicine | Admitting: Family Medicine

## 2016-04-28 DIAGNOSIS — R0600 Dyspnea, unspecified: Secondary | ICD-10-CM

## 2016-04-29 ENCOUNTER — Emergency Department (HOSPITAL_COMMUNITY): Payer: Medicare Other

## 2016-04-29 ENCOUNTER — Emergency Department (HOSPITAL_COMMUNITY)
Admission: EM | Admit: 2016-04-29 | Discharge: 2016-04-29 | Disposition: A | Discharge status: Home / Self Care | Payer: Medicare Other | Attending: Physician Assistant | Admitting: Physician Assistant

## 2016-04-29 ENCOUNTER — Encounter (HOSPITAL_COMMUNITY): Payer: Self-pay | Admitting: Emergency Medicine

## 2016-04-29 ENCOUNTER — Telehealth: Payer: Self-pay | Admitting: Cardiology

## 2016-04-29 DIAGNOSIS — Z7901 Long term (current) use of anticoagulants: Secondary | ICD-10-CM | POA: Diagnosis not present

## 2016-04-29 DIAGNOSIS — J90 Pleural effusion, not elsewhere classified: Secondary | ICD-10-CM

## 2016-04-29 DIAGNOSIS — R531 Weakness: Secondary | ICD-10-CM | POA: Diagnosis not present

## 2016-04-29 DIAGNOSIS — I10 Essential (primary) hypertension: Secondary | ICD-10-CM | POA: Insufficient documentation

## 2016-04-29 DIAGNOSIS — Z87891 Personal history of nicotine dependence: Secondary | ICD-10-CM | POA: Diagnosis not present

## 2016-04-29 DIAGNOSIS — R0602 Shortness of breath: Secondary | ICD-10-CM | POA: Diagnosis present

## 2016-04-29 LAB — COMPREHENSIVE METABOLIC PANEL
ALBUMIN: 3.6 g/dL (ref 3.5–5.0)
ALK PHOS: 56 U/L (ref 38–126)
ALT: 16 U/L — AB (ref 17–63)
ANION GAP: 7 (ref 5–15)
AST: 21 U/L (ref 15–41)
BILIRUBIN TOTAL: 1.8 mg/dL — AB (ref 0.3–1.2)
BUN: 19 mg/dL (ref 6–20)
CALCIUM: 8.6 mg/dL — AB (ref 8.9–10.3)
CO2: 26 mmol/L (ref 22–32)
CREATININE: 1.21 mg/dL (ref 0.61–1.24)
Chloride: 104 mmol/L (ref 101–111)
GFR calc Af Amer: 58 mL/min — ABNORMAL LOW (ref >60)
GFR calc non Af Amer: 50 mL/min — ABNORMAL LOW (ref >60)
GLUCOSE: 105 mg/dL — AB (ref 65–99)
Potassium: 4.1 mmol/L (ref 3.5–5.1)
Sodium: 137 mmol/L (ref 135–145)
TOTAL PROTEIN: 6.7 g/dL (ref 6.5–8.1)

## 2016-04-29 LAB — CBC WITH DIFFERENTIAL/PLATELET
BASOS PCT: 1 %
Basophils Absolute: 0 10*3/uL (ref 0.0–0.1)
Eosinophils Absolute: 0 10*3/uL (ref 0.0–0.7)
Eosinophils Relative: 1 %
HEMATOCRIT: 35.5 % — AB (ref 39.0–52.0)
HEMOGLOBIN: 11.8 g/dL — AB (ref 13.0–17.0)
LYMPHS ABS: 1.2 10*3/uL (ref 0.7–4.0)
Lymphocytes Relative: 28 %
MCH: 30 pg (ref 26.0–34.0)
MCHC: 33.2 g/dL (ref 30.0–36.0)
MCV: 90.3 fL (ref 78.0–100.0)
MONOS PCT: 12 %
Monocytes Absolute: 0.5 10*3/uL (ref 0.1–1.0)
NEUTROS ABS: 2.5 10*3/uL (ref 1.7–7.7)
NEUTROS PCT: 58 %
Platelets: 193 10*3/uL (ref 150–400)
RBC: 3.93 MIL/uL — ABNORMAL LOW (ref 4.22–5.81)
RDW: 15.2 % (ref 11.5–15.5)
WBC: 4.2 10*3/uL (ref 4.0–10.5)

## 2016-04-29 LAB — URINALYSIS, ROUTINE W REFLEX MICROSCOPIC
BILIRUBIN URINE: NEGATIVE
GLUCOSE, UA: NEGATIVE mg/dL
HGB URINE DIPSTICK: NEGATIVE
KETONES UR: NEGATIVE mg/dL
LEUKOCYTES UA: NEGATIVE
Nitrite: NEGATIVE
PROTEIN: NEGATIVE mg/dL
Specific Gravity, Urine: 1.014 (ref 1.005–1.030)
pH: 5 (ref 5.0–8.0)

## 2016-04-29 LAB — BRAIN NATRIURETIC PEPTIDE: B Natriuretic Peptide: 318.8 pg/mL — ABNORMAL HIGH (ref 0.0–100.0)

## 2016-04-29 LAB — I-STAT TROPONIN, ED: Troponin i, poc: 0.07 ng/mL (ref 0.00–0.08)

## 2016-04-29 LAB — I-STAT CG4 LACTIC ACID, ED: Lactic Acid, Venous: 1.05 mmol/L (ref 0.5–1.9)

## 2016-04-29 MED ORDER — IOPAMIDOL (ISOVUE-300) INJECTION 61%
75.0000 mL | Freq: Once | INTRAVENOUS | Status: AC | PRN
  Administered 2016-04-29: 75 mL via INTRAVENOUS

## 2016-04-29 NOTE — ED Provider Notes (Signed)
[facility] DEPT Provider Note   CSN: [id number] Arrival date & time: 04/29/16  1008   History   Chief Complaint No chief complaint on file.   HPI Ralph Mendoza is a 83 y.o. male with past medical history of atrial fibrillation on Eliquis, hypertension, cardiomyopathy, systolic heart failure on lasix, hyperlipidemia, kidney insufficiency and remote history of tobacco use presents to the ED with shortness of breath worse on exertion and at night when trying to sleep, weakness and dry cough for the last 3-4 weeks. Patient states he started feeling sick including sore throat, cough and leg weakness 3-4 weeks ago.  At that time patient went to PCP who told him he might be developing the flu, he was treated with "some over-the-counter pills" however his symptoms never resolved. Patient presented to his PCP again yesterday for similar symptoms and worsening shortness of breath on exertion and while sleeping, PCP yesterday did an x-ray and called patient today instructing him to come to the emergency department for further treatment of lung fluid.  Pt states breathing is worse when laying supine.    Patient states he is not taking his Lasix as instructed, patient states he takes his lasix at most once weekly. Patient denies recent fevers, chills, unexpected weight loss, abdominal pain, nausea, vomiting, diarrhea, constipation, hematuria, dark stools, bone pain. Patient was a tobacco user over 60 years ago for about 10 years. No history of lung disease, asthma, COPD.    Per chart review, small bilateral pleural effusion first noted on CXR 12/08/15 done for shortness of breath.  LVEF 25-30% with pulmonary pressure moderately increase on echo on 12/13/15.   HPI  Past Medical History:  Diagnosis Date  . Atrial fibrillation ([place])   . BPH (benign prostatic hyperplasia)   . Dyspnea on exertion    a. 09/2008 NL EF on myoview.  [name] Hearing difficulty    R sided hole in eardrum, only wears L hearing aid  .  History of tobacco abuse   . Hypertension   . Lumbar stenosis with neurogenic claudication    a. 02/2011 s/p decompressive laminectomy.  . Midsternal chest pain    a. 09/2008 Myoview: EF 56%, mild fixed thinning of inf wall particularly @ base -? attenuation.  No ischemia  . PUD (peptic ulcer disease)    Remote, history of  . Urinary hesitancy     Patient Active Problem List   Diagnosis Date Noted  . Near syncope 04/07/2013  . Congestive dilated cardiomyopathy ([place]) 06/09/2012  . Atrial fibrillation ([place]) 05/17/2012  . Midsternal chest pain 09/21/2011  . Lumbar stenosis with neurogenic claudication 03/04/2011  . HYPERCHOLESTEROLEMIA-PURE 09/11/2008  . DYSPNEA 09/11/2008  . ABDOMINAL PAIN RIGHT LOWER QUADRANT 09/11/2008  . Essential hypertension 09/10/2008    Past Surgical History:  Procedure Laterality Date  . HERNIA REPAIR     Left  . LUMBAR LAMINECTOMY/DECOMPRESSION MICRODISCECTOMY  03/04/2011   Procedure: LUMBAR LAMINECTOMY/DECOMPRESSION MICRODISCECTOMY;  Surgeon: Ophelia Charter;  Location: [facility];  Service: Neurosurgery;  Laterality: N/A;  LUMBAR THREE-FOUR ,FOUR-FIVE LAMINECTOMIES  . TYMPANOMASTOIDECTOMY     Left modified radical with type 3 tympanoplasty       Home Medications    Prior to Admission medications   Medication Sig Start Date End Date Taking? Authorizing Provider  acetaminophen (TYLENOL) 325 MG tablet Take 650 mg by mouth every 6 (six) hours as needed for pain.   Yes Historical Provider, MD  ELIQUIS 5 MG TABS tablet TAKE 1 TABLET TWICE A  DAY Patient taking differently: TAKE 22m by mouth TABLET TWICE A DAY 03/09/16  Yes BLelon Perla MD  furosemide (LASIX) 40 MG tablet Take 1 tablet (40 mg total) by mouth daily. 01/02/16 04/29/16 Yes LIsaiah Serge NP  isosorbide mononitrate (IMDUR) 30 MG 24 hr tablet Take 1 tablet (30 mg total) by mouth daily. Patient taking differently: Take 30 mg by mouth at bedtime.  01/02/16 04/29/16 Yes LIsaiah Serge NP    lisinopril (PRINIVIL,ZESTRIL) 20 MG tablet Take 1 tablet (20 mg total) by mouth daily. 03/09/16  Yes BLelon Perla MD  potassium chloride SA (K-DUR,KLOR-CON) 20 MEQ tablet Take 0.5 tablets (10 mEq total) by mouth daily. 12/18/15  Yes LIsaiah Serge NP    Family History Family History  Problem Relation Age of Onset  . Lung cancer Mother   . Appendicitis Father   . Diabetes Sister     DM  . Coronary artery disease Neg Hx     No premature CAD  . Colon cancer Neg Hx     Social History Social History  Substance Use Topics  . Smoking status: Former SResearch scientist (life sciences) . Smokeless tobacco: Never Used     Comment: Remote history of smoking  . Alcohol use No     Allergies   Patient has no known allergies.   Review of Systems Review of Systems  Constitutional: Negative for chills and fever.  HENT: Positive for sore throat. Negative for congestion.   Eyes: Negative for visual disturbance.  Respiratory: Positive for cough and shortness of breath. Negative for chest tightness.   Cardiovascular: Negative for chest pain, palpitations and leg swelling.  Gastrointestinal: Negative for abdominal pain, constipation, diarrhea, nausea and vomiting.  Genitourinary: Positive for difficulty urinating (chronic, BPH). Negative for flank pain and hematuria.  Musculoskeletal: Negative for arthralgias, back pain and joint swelling.  Skin: Negative for rash.  Neurological: Positive for weakness. Negative for dizziness, tremors, syncope, facial asymmetry, light-headedness, numbness and headaches.  Hematological: Bruises/bleeds easily.     Physical Exam Updated Vital Signs BP 155/91   Pulse 75   Temp 97.5 F (36.4 C) (Oral)   Resp 16   Ht 5' 10.5" (1.791 m)   Wt 83.9 kg   SpO2 98%   BMI 26.17 kg/m   Physical Exam  Constitutional: He is oriented to person, place, and time. He appears well-developed and well-nourished. No distress.  Pt is laying supine with HOB slightly elevated in no acute  respiratory distress, speaking in full sentences. Weight 83.9, unchanged from weight at PCP office 12/2015.  HENT:  Head: Normocephalic and atraumatic.  Nose: Nose normal.  Mouth/Throat: Oropharynx is clear and moist. No oropharyngeal exudate.  Eyes: Conjunctivae and EOM are normal. Pupils are equal, round, and reactive to light.  Neck: Normal range of motion. Neck supple. No JVD present. No tracheal deviation present.  Cardiovascular: Normal rate, normal heart sounds and intact distal pulses.   No murmur heard. Irregularly irregular rhythm. No lower extremity edema. No ascites.   Pulmonary/Chest:  Effort normal.  No respiratory distress. SpO2 96%. No wheezing, rales or rhonchi. Decreased breath sound RML and RLL. Decreased breath sound LLL. No chest wall tenderness.  Respiratory effort increased with HOB flat.  Abdominal: Soft. Bowel sounds are normal. He exhibits no distension. There is no tenderness.  Musculoskeletal: Normal range of motion. He exhibits no deformity.  Lymphadenopathy:    He has no cervical adenopathy.  Neurological: He is alert and oriented to person, place, and  time.  Skin: Skin is warm and dry. Capillary refill takes less than 2 seconds.  Psychiatric: He has a normal mood and affect. His behavior is normal. Judgment and thought content normal.  Nursing note and vitals reviewed.    ED Treatments / Results  Labs (all labs ordered are listed, but only abnormal results are displayed) Labs Reviewed  CBC WITH DIFFERENTIAL/PLATELET - Abnormal; Notable for the following:       Result Value   RBC 3.93 (*)    Hemoglobin 11.8 (*)    HCT 35.5 (*)    All other components within normal limits  COMPREHENSIVE METABOLIC PANEL - Abnormal; Notable for the following:    Glucose, Bld 105 (*)    Calcium 8.6 (*)    ALT 16 (*)    Total Bilirubin 1.8 (*)    GFR calc non Af Amer 50 (*)    GFR calc Af Amer 58 (*)    All other components within normal limits  BRAIN NATRIURETIC  PEPTIDE - Abnormal; Notable for the following:    B Natriuretic Peptide 318.8 (*)    All other components within normal limits  URINALYSIS, ROUTINE W REFLEX MICROSCOPIC  I-STAT TROPOININ, ED  I-STAT CG4 LACTIC ACID, ED    EKG  EKG Interpretation  Date/Time:  Wednesday April 29 2016 11:24:44 EST Ventricular Rate:  71 PR Interval:    QRS Duration: 98 QT Interval:  463 QTC Calculation: 486 R Axis:   -49 Text Interpretation:  Atrial fibrillation Confirmed by Gerald Leitz ([id number]) on 04/29/2016 11:45:59 AM       Radiology Dg Chest 2 View  Result Date: 04/28/2016 CLINICAL DATA:  Cough and shortness of breath associated with weakness for the past 4 5 months. History of hypertension, tobacco use, congested cardiomyopathy. EXAM: CHEST  2 VIEW COMPARISON:  PA and lateral chest x-ray of December 08, 2015 FINDINGS: There has been a significant interval increase in the right-sided pleural effusion. A small left pleural effusion is present and stable. The aerated parenchyma in the right lung is clear. A right basilar infiltrate cannot be excluded. The left lung is clear. The heart is not enlarged. The pulmonary vascularity is not engorged. There is calcification in the wall of the aortic arch. IMPRESSION: Considerable increase in the volume of the right pleural effusion such that it is moderate in size and occupies between 1/3-1/2 of the pleural space volume. Chest CT scanning is recommended. Stable small left pleural effusion. Thoracic aortic atherosclerosis. Electronically Signed   By: David  Martinique M.D.   On: 04/28/2016 14:47   Ct Chest W Contrast  Result Date: 04/29/2016 CLINICAL DATA:  RIGHT pleural effusion, weakness and cough for 4-5 months EXAM: CT CHEST WITH CONTRAST TECHNIQUE: Multidetector CT imaging of the chest was performed during intravenous contrast administration. Sagittal and coronal MPR images reconstructed from axial data set. CONTRAST:  42m ISOVUE-300 IOPAMIDOL (ISOVUE-300)  INJECTION 61% IV COMPARISON:  Chest radiograph 04/28/2016 FINDINGS: Cardiovascular: Atherosclerotic calcifications aorta and coronary arteries. Aorta normal caliber without aneurysm or dissection. Pulmonary arteries grossly patent on nondedicated exam. No pericardial effusion. Diffuse enlargement of cardiac chambers particularly the atria. Significant atherosclerotic calcification at the origins of the SMA and LEFT renal artery, less at celiac and RIGHT renal artery origins. Mediastinum/Nodes: Esophagus unremarkable. No thoracic adenopathy. Base of cervical region unremarkable. Lungs/Pleura: Large RIGHT pleural effusion. Small LEFT pleural effusion. Compressive atelectasis in BILATERAL lower lobes greater on RIGHT. Mild partial atelectasis of the RIGHT middle lobe. Area of more masslike  opacity in the anterior LEFT upper lobe, favor atelectasis but requiring followup assessment until resolution to exclude underlying mass, area in question approximately 18 mm in greatest size. Remaining lungs clear. No infiltrate or pneumothorax. Upper Abdomen: Remaining visualized upper abdomen unremarkable within limits of respiratory motion artifacts. Musculoskeletal: No acute osseous findings. Suspected vertebral hemangioma lower thoracic spine at T12. IMPRESSION: Large RIGHT and small LEFT pleural effusions with adjacent compressive atelectasis of the lungs greatest in RIGHT lower lobe. Area of probable atelectasis in anterior RIGHT upper lobe though requiring followup CT imaging in 2-3 months to ensure resolution and exclude underlying mass. Aortic atherosclerosis and coronary artery disease with additional atherosclerotic calcifications at the origins of particularly the LEFT renal artery and SMA. Electronically Signed   By: Lavonia Dana M.D.   On: 04/29/2016 14:35    Procedures Procedures (including critical care time)  Medications Ordered in ED Medications  iopamidol (ISOVUE-300) 61 % injection 75 mL (75 mLs  Intravenous Contrast Given 04/29/16 1407)     Initial Impression / Assessment and Plan / ED Course  I have reviewed the triage vital signs and the nursing notes.  Pertinent labs & imaging results that were available during my care of the patient were reviewed by me and considered in my medical decision making (see chart for details).  Clinical Course as of Apr 29 1525  Wed Apr 29, 2016  1347 I spoke to case management who met with patient, patient's daughter and patient's son-in-law. Reportedly patient is mostly independent and has reliable transportation. Patient denied case management services today.  [CG]  1445 Reviewed. B Natriuretic Peptide: (!) 318.8 [CG]    Clinical Course User Index [CG] Kinnie Feil, PA-C   Will get CT chest and labs. Pt's weight appears to be unchanged from weight recorded on 12/2015 at PCP office.   Patient's vitals have been acceptable in ED. Patient ambulated and maintained oxygen saturation above 95% without additional O2 requirement. Patient denied shortness of breath while walking. Patient tolerating PO in ED. BNP is elevated at 318. Patient states he is not compliant with Lasix. CT chest revealed large LEFT pleural effusion and small RIGHT pleural effusion with atelectasis bilaterally and masslike opacity in the anterior LEFT upper lobe.  Radiologist recommends f/u CT scan for resolution and rule out mass. No infiltrate, pneumothorax.  Lungs clear. Pt will be discharged with close PCP follow up.  Pt strongly encouraged to take lasix daily and as prescribed and monitor his weight. Pt given CT scan report. CT scan results discussed with pt's daughter and pt's son in law.   Pt discussed with and evaluated by Dr. Thomasene Lot who assisted in MDM and family discussion.   Final Clinical Impressions(s) / ED Diagnoses   Final diagnoses:  Pleural effusion  Shortness of breath  Weakness    New Prescriptions New Prescriptions   No medications on file       Kinnie Feil, PA-C 04/29/16 [place], MD 04/30/16 1118

## 2016-04-29 NOTE — ED Notes (Signed)
Patient transported to CT 

## 2016-04-29 NOTE — Care Management Note (Signed)
Case Management Note  Patient Details  Name: Ralph Mendoza MRN: [id number] Date of Birth: [birth year]  Subjective/Objective:  83 y.o. M seen in the ED for SOB. CM received consult for assist with [facility] services and to ensure pt has transportation to and from appointments, etc. CM called ALF and spoke with Carla Drape, RN who is on duty and very familiar with this pt. With the verbal permission of the pt. 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 She confirms that daughter and son-in-law are available to transport pt to appts when needed. [name] asked that he bring AVS and share with the staff after discharge which I shared with the pt. And his daughter.                    Action/Plan: Discharge to [facility]. No further  CM needs at this time.    Expected Discharge Date:                  Expected Discharge Plan:  Assisted Living / Rest Home (Resident of [facility])  In-House Referral:  Clinical Social Work  Discharge planning Services  CM Consult  Post Acute Care Choice:  Home Health Choice offered to:  Patient, Adult Children [name])  DME Arranged:  N/A Kasandra Knudsen in room) DME Agency:  NA  HH Arranged:  NA (Refused) [place] Agency:  [facility] ([facility]) ([facility] on[facility] at [facility])  Status of Service:  Completed, signed off  If discussed at [employer], dates discussed:    Additional Comments:  Delrae Sawyers, RN 04/29/2016, 1:38 PM

## 2016-04-29 NOTE — ED Notes (Signed)
Patient oxygen saturation remained at 95-96%. Patient states he did not feel short of breath while walking, however, states he felt short of breath after ambulation. Patient heart rate remained at 102 while ambulating.

## 2016-04-29 NOTE — ED Notes (Signed)
Patient made aware of urine sample. Urinal placed at bedside and patient encouraged to void when able. 

## 2016-04-29 NOTE — Telephone Encounter (Signed)
Will forward to Dr Stanford Breed

## 2016-04-29 NOTE — ED Notes (Signed)
EDP made aware that patient's family wants to speak with them. EDP to bedside.

## 2016-04-29 NOTE — ED Triage Notes (Signed)
Patient states that he has been having SOB and worse at night when trying to sleep.  Patient recently over cold.

## 2016-04-29 NOTE — ED Notes (Signed)
Pt family asking for d/c papers. PA made aware.

## 2016-04-29 NOTE — Discharge Instructions (Signed)
Your CT scan revealed a large left pleural effusion and a small right pleural effusion and questionable masslike opacity in the left upper lung. It is important that you follow-up with your primary care provider in the next 2-3 days for further discussion of your CT scan findings and shortness of breath.   Resume taking all your home medications. Please take your Lasix as prescribed and every day.  Please read the attached information on pleural effusion and heart failure.  Please return to the emergency department your shortness of breath worsens.

## 2016-04-29 NOTE — Telephone Encounter (Signed)
New Message   Per Luanne faxed over labs & chest x-ray to Dr. Stanford Breed. Just sent pt to [facility]/ pt has huge plural effusion taking up his right lung. Wanted Dr. Stanford Breed to be aware

## 2016-05-01 NOTE — Progress Notes (Signed)
HPI: FU atrial fibrillation. We have elected rate control and anticoagulation for afib. Holter monitor in February of 2014 showed sinus rhythm, mobitz 1; toprol DCed. Echocardiogram August 2017 showed ejection fraction 25-30%, moderate mitral regurgitation, biatrial enlargement and mild right ventricular enlargement. Pulmonary pressure moderately increased. Nuclear study September 2017 showed ejection fraction 28%. No ischemia. Fixed apical defect. Patient had CT scan January 2018 that showed large right and small left pleural effusion. Probable area of atelectasis in anterior right upper lobe but follow-up recommended in 2-3 months. BNP 318. Patient had thoracentesis January 18. Cytology negative. No other labs available. Since last seen, he denies dyspnea, chest pain, palpitations or syncope. He has only been taking his Lasix intermittently.  Current Outpatient Prescriptions  Medication Sig Dispense Refill  . acetaminophen (TYLENOL) 325 MG tablet Take 650 mg by mouth every 6 (six) hours as needed for pain.    [name] ELIQUIS 5 MG TABS tablet TAKE 1 TABLET TWICE A DAY (Patient taking differently: TAKE 5mg  by mouth TABLET TWICE A DAY) 180 tablet 1  . lisinopril (PRINIVIL,ZESTRIL) 20 MG tablet Take 1 tablet (20 mg total) by mouth daily. 180 tablet 1  . potassium chloride SA (K-DUR,KLOR-CON) 20 MEQ tablet Take 0.5 tablets (10 mEq total) by mouth daily. 45 tablet 3  . furosemide (LASIX) 40 MG tablet Take 1 tablet (40 mg total) by mouth daily. 90 tablet 3  . isosorbide mononitrate (IMDUR) 30 MG 24 hr tablet Take 1 tablet (30 mg total) by mouth daily. (Patient taking differently: Take 30 mg by mouth at bedtime. ) 90 tablet 3   No current facility-administered medications for this visit.      Past Medical History:  Diagnosis Date  . Atrial fibrillation ([place])   . BPH (benign prostatic hyperplasia)   . Dyspnea on exertion    a. 09/2008 NL EF on myoview.  [name] Hearing difficulty    R sided hole in  eardrum, only wears L hearing aid  . History of tobacco abuse   . Hypertension   . Lumbar stenosis with neurogenic claudication    a. 02/2011 s/p decompressive laminectomy.  . Midsternal chest pain    a. 09/2008 Myoview: EF 56%, mild fixed thinning of inf wall particularly @ base -? attenuation.  No ischemia  . PUD (peptic ulcer disease)    Remote, history of  . Urinary hesitancy     Past Surgical History:  Procedure Laterality Date  . HERNIA REPAIR     Left  . LUMBAR LAMINECTOMY/DECOMPRESSION MICRODISCECTOMY  03/04/2011   Procedure: LUMBAR LAMINECTOMY/DECOMPRESSION MICRODISCECTOMY;  Surgeon: Ophelia Charter;  Location: [facility];  Service: Neurosurgery;  Laterality: N/A;  LUMBAR THREE-FOUR ,FOUR-FIVE LAMINECTOMIES  . TYMPANOMASTOIDECTOMY     Left modified radical with type 3 tympanoplasty    Social History   Social History  . Marital status: Widowed    Spouse name: N/A  . Number of children: 2  . Years of education: N/A   Occupational History  . [employer]     Retired   Social History Main Topics  . Smoking status: Former Research scientist (life sciences)  . Smokeless tobacco: Never Used     Comment: Remote history of smoking  . Alcohol use No  . Drug use: No  . Sexual activity: Not on file   Other Topics Concern  . Not on file   Social History Narrative   Lives with his wife in [place].   He swims daily.   He has  2 grown daughters.   1 Caffeine drink daily     Family History  Problem Relation Age of Onset  . Lung cancer Mother   . Appendicitis Father   . Diabetes Sister     DM  . Coronary artery disease Neg Hx     No premature CAD  . Colon cancer Neg Hx     ROS: vision decreasing but no fevers or chills, productive cough, hemoptysis, dysphasia, odynophagia, melena, hematochezia, dysuria, hematuria, rash, seizure activity, orthopnea, PND, pedal edema, claudication. Remaining systems are negative.  Physical Exam: Well-developed well-nourished in no acute  distress.  Skin is warm and dry.  HEENT is normal.  Neck is supple.  Chest is clear to auscultation with normal expansion.  Cardiovascular exam is irregular Abdominal exam nontender or distended. No masses palpated. Extremities show no edema. neuro grossly intact  ECG-04/29/2016-atrial fibrillation with nonspecific ST changes.  A/P  1 Atrial fibrillation-rate is controlled on no medications. Continue apixaban.  2 chronic systolic congestive heart failure-recent chest CT showed pleural effusion and patient had thoracentesis with negative cytology. No other labs available to differentiate transudate versus exudate. He is euvolemic on examination. He has not been taking his Lasix daily. I have asked him to take Lasix 40 mg daily and potassium 20 mg daily. Check potassium and renal function in 1 week.   3 cardiomyopathy-nuclear study did not suggest coronary disease. Given his age I would prefer to be conservative. Continue ACE inhibitor. No beta blocker given history of bradycardia.  4 hypertension-blood pressure controlled. Continue present medications.  5 hyperlipidemia-management per primary care.  Kirk Ruths, MD

## 2016-05-04 ENCOUNTER — Other Ambulatory Visit (HOSPITAL_COMMUNITY): Payer: Self-pay | Admitting: Family Medicine

## 2016-05-04 DIAGNOSIS — J90 Pleural effusion, not elsewhere classified: Secondary | ICD-10-CM

## 2016-05-07 ENCOUNTER — Ambulatory Visit (HOSPITAL_COMMUNITY)
Admission: RE | Admit: 2016-05-07 | Discharge: 2016-05-07 | Disposition: A | Discharge status: Home / Self Care | Payer: Medicare Other | Source: Ambulatory Visit | Attending: General Surgery | Admitting: General Surgery

## 2016-05-07 ENCOUNTER — Ambulatory Visit (HOSPITAL_COMMUNITY)
Admission: RE | Admit: 2016-05-07 | Discharge: 2016-05-07 | Disposition: A | Discharge status: Home / Self Care | Payer: Medicare Other | Source: Ambulatory Visit | Attending: Family Medicine | Admitting: Family Medicine

## 2016-05-07 DIAGNOSIS — R846 Abnormal cytological findings in specimens from respiratory organs and thorax: Secondary | ICD-10-CM | POA: Diagnosis not present

## 2016-05-07 DIAGNOSIS — Z9889 Other specified postprocedural states: Secondary | ICD-10-CM | POA: Diagnosis not present

## 2016-05-07 DIAGNOSIS — J90 Pleural effusion, not elsewhere classified: Secondary | ICD-10-CM | POA: Diagnosis present

## 2016-05-07 NOTE — Procedures (Signed)
Ultrasound-guided diagnostic and therapeutic right thoracentesis performed yielding 1.5 liters of yellow colored fluid. No immediate complications. Follow-up chest x-ray pending.       Ralph Mendoza E 10:10 AM 05/07/2016

## 2016-05-11 ENCOUNTER — Ambulatory Visit (INDEPENDENT_AMBULATORY_CARE_PROVIDER_SITE_OTHER): Payer: Medicare Other | Admitting: Cardiology

## 2016-05-11 ENCOUNTER — Encounter: Payer: Self-pay | Admitting: Cardiology

## 2016-05-11 VITALS — BP 114/70 | HR 66 | Ht 70.0 in | Wt 174.0 lb

## 2016-05-11 DIAGNOSIS — I4891 Unspecified atrial fibrillation: Secondary | ICD-10-CM

## 2016-05-11 DIAGNOSIS — I10 Essential (primary) hypertension: Secondary | ICD-10-CM

## 2016-05-11 DIAGNOSIS — I429 Cardiomyopathy, unspecified: Secondary | ICD-10-CM | POA: Diagnosis not present

## 2016-05-11 MED ORDER — POTASSIUM CHLORIDE CRYS ER 20 MEQ PO TBCR: 20.0000 meq | EXTENDED_RELEASE_TABLET | Freq: Every day | ORAL | 3 refills | Status: DC

## 2016-05-11 NOTE — Patient Instructions (Signed)
Medication Instructions:   TAKE FUROSEMIDE AND POTASSIUM DAILY  Labwork:  Your physician recommends that you return for lab work in: [place]:  Your physician recommends that you schedule a follow-up appointment in: [place]

## 2016-05-21 ENCOUNTER — Encounter: Payer: Self-pay | Admitting: Cardiology

## 2016-05-21 LAB — BASIC METABOLIC PANEL
BUN/Creatinine Ratio: 28 — ABNORMAL HIGH (ref 10–24)
BUN: 39 mg/dL — ABNORMAL HIGH (ref 10–36)
CALCIUM: 9.2 mg/dL (ref 8.6–10.2)
CHLORIDE: 99 mmol/L (ref 96–106)
CO2: 30 mmol/L — AB (ref 18–29)
Creatinine, Ser: 1.37 mg/dL — ABNORMAL HIGH (ref 0.76–1.27)
GFR calc Af Amer: 51 mL/min/{1.73_m2} — ABNORMAL LOW (ref >59)
GFR calc non Af Amer: 44 mL/min/{1.73_m2} — ABNORMAL LOW (ref >59)
GLUCOSE: 106 mg/dL — AB (ref 65–99)
POTASSIUM: 4.4 mmol/L (ref 3.5–5.2)
Sodium: 137 mmol/L (ref 134–144)

## 2016-05-22 ENCOUNTER — Other Ambulatory Visit: Payer: Self-pay | Admitting: *Deleted

## 2016-05-22 DIAGNOSIS — N289 Disorder of kidney and ureter, unspecified: Secondary | ICD-10-CM

## 2016-05-27 ENCOUNTER — Ambulatory Visit (INDEPENDENT_AMBULATORY_CARE_PROVIDER_SITE_OTHER): Payer: Medicare Other | Admitting: Podiatry

## 2016-05-27 ENCOUNTER — Encounter: Payer: Self-pay | Admitting: Podiatry

## 2016-05-27 VITALS — Ht 70.0 in | Wt 174.0 lb

## 2016-05-27 DIAGNOSIS — M79676 Pain in unspecified toe(s): Secondary | ICD-10-CM

## 2016-05-27 DIAGNOSIS — B351 Tinea unguium: Secondary | ICD-10-CM

## 2016-05-27 NOTE — Progress Notes (Signed)
Complaint:  Visit Type: Patient returns to my office for continued preventative foot care services. Complaint: Patient states" my nails have grown long and thick and become painful to walk and wear shoes" Patient takes eliquess. The patient presents for preventative foot care services. No changes to ROS  Podiatric Exam: Vascular: dorsalis pedis and posterior tibial pulses are palpable bilateral. Capillary return is immediate. Temperature gradient is WNL. Skin turgor WNL  Sensorium: Normal Semmes Weinstein monofilament test. Normal tactile sensation bilaterally. Nail Exam: Pt has thick disfigured discolored nails with subungual debris noted bilateral entire nail hallux through fifth toenails Ulcer Exam: There is no evidence of ulcer or pre-ulcerative changes or infection. Orthopedic Exam: Muscle tone and strength are WNL. No limitations in general ROM. No crepitus or effusions noted. Foot type and digits show no abnormalities. Bony prominences are unremarkable. Skin: No Porokeratosis. No infection or ulcers  Diagnosis:  Onychomycosis, , Pain in right toe, pain in left toes  Treatment & Plan Procedures and Treatment: Consent by patient was obtained for treatment procedures. The patient understood the discussion of treatment and procedures well. All questions were answered thoroughly reviewed. Debridement of mycotic and hypertrophic toenails, 1 through 5 bilateral and clearing of subungual debris. No ulceration, no infection noted.  Return Visit-Office Procedure: Patient instructed to return to the office for a follow up visit 3 months for continued evaluation and treatment.    Gardiner Barefoot DPM

## 2016-06-03 ENCOUNTER — Encounter (HOSPITAL_COMMUNITY): Payer: Self-pay | Admitting: Emergency Medicine

## 2016-06-03 ENCOUNTER — Emergency Department (HOSPITAL_COMMUNITY)
Admission: EM | Admit: 2016-06-03 | Discharge: 2016-06-03 | Disposition: A | Discharge status: Home / Self Care | Payer: Medicare Other | Attending: Emergency Medicine | Admitting: Emergency Medicine

## 2016-06-03 ENCOUNTER — Emergency Department (HOSPITAL_COMMUNITY): Payer: Medicare Other

## 2016-06-03 DIAGNOSIS — R109 Unspecified abdominal pain: Secondary | ICD-10-CM | POA: Diagnosis present

## 2016-06-03 DIAGNOSIS — Z87891 Personal history of nicotine dependence: Secondary | ICD-10-CM | POA: Insufficient documentation

## 2016-06-03 DIAGNOSIS — Z7901 Long term (current) use of anticoagulants: Secondary | ICD-10-CM | POA: Insufficient documentation

## 2016-06-03 DIAGNOSIS — I10 Essential (primary) hypertension: Secondary | ICD-10-CM | POA: Insufficient documentation

## 2016-06-03 DIAGNOSIS — K5792 Diverticulitis of intestine, part unspecified, without perforation or abscess without bleeding: Secondary | ICD-10-CM

## 2016-06-03 DIAGNOSIS — Z79899 Other long term (current) drug therapy: Secondary | ICD-10-CM | POA: Insufficient documentation

## 2016-06-03 DIAGNOSIS — K5790 Diverticulosis of intestine, part unspecified, without perforation or abscess without bleeding: Secondary | ICD-10-CM | POA: Insufficient documentation

## 2016-06-03 LAB — COMPREHENSIVE METABOLIC PANEL
ALK PHOS: 62 U/L (ref 38–126)
ALT: 16 U/L — AB (ref 17–63)
AST: 21 U/L (ref 15–41)
Albumin: 4.1 g/dL (ref 3.5–5.0)
Anion gap: 7 (ref 5–15)
BUN: 53 mg/dL — AB (ref 6–20)
CALCIUM: 9.2 mg/dL (ref 8.9–10.3)
CHLORIDE: 100 mmol/L — AB (ref 101–111)
CO2: 27 mmol/L (ref 22–32)
CREATININE: 1.51 mg/dL — AB (ref 0.61–1.24)
GFR calc non Af Amer: 38 mL/min — ABNORMAL LOW (ref >60)
GFR, EST AFRICAN AMERICAN: 44 mL/min — AB (ref >60)
GLUCOSE: 144 mg/dL — AB (ref 65–99)
Potassium: 4.5 mmol/L (ref 3.5–5.1)
SODIUM: 134 mmol/L — AB (ref 135–145)
Total Bilirubin: 1.9 mg/dL — ABNORMAL HIGH (ref 0.3–1.2)
Total Protein: 7.8 g/dL (ref 6.5–8.1)

## 2016-06-03 LAB — CBC
HCT: 38.3 % — ABNORMAL LOW (ref 39.0–52.0)
Hemoglobin: 13.1 g/dL (ref 13.0–17.0)
MCH: 30.9 pg (ref 26.0–34.0)
MCHC: 34.2 g/dL (ref 30.0–36.0)
MCV: 90.3 fL (ref 78.0–100.0)
PLATELETS: 168 10*3/uL (ref 150–400)
RBC: 4.24 MIL/uL (ref 4.22–5.81)
RDW: 14.5 % (ref 11.5–15.5)
WBC: 9.6 10*3/uL (ref 4.0–10.5)

## 2016-06-03 LAB — LIPASE, BLOOD: LIPASE: 78 U/L — AB (ref 11–51)

## 2016-06-03 LAB — LACTIC ACID, PLASMA: LACTIC ACID, VENOUS: 1.1 mmol/L (ref 0.5–1.9)

## 2016-06-03 MED ORDER — METRONIDAZOLE 500 MG PO TABS
500.0000 mg | ORAL_TABLET | Freq: Once | ORAL | Status: AC
  Administered 2016-06-03: 500 mg via ORAL
  Filled 2016-06-03: qty 1

## 2016-06-03 MED ORDER — CIPROFLOXACIN HCL 500 MG PO TABS: 500.0000 mg | ORAL_TABLET | Freq: Two times a day (BID) | ORAL | 0 refills | Status: DC

## 2016-06-03 MED ORDER — METRONIDAZOLE 500 MG PO TABS: 500.0000 mg | ORAL_TABLET | Freq: Two times a day (BID) | ORAL | 0 refills | Status: DC

## 2016-06-03 MED ORDER — CIPROFLOXACIN HCL 500 MG PO TABS
500.0000 mg | ORAL_TABLET | Freq: Once | ORAL | Status: AC
  Administered 2016-06-03: 500 mg via ORAL
  Filled 2016-06-03: qty 1

## 2016-06-03 MED ORDER — SODIUM CHLORIDE 0.9 % IJ SOLN
INTRAMUSCULAR | Status: AC
  Administered 2016-06-03: 12:00:00
  Filled 2016-06-03: qty 50

## 2016-06-03 MED ORDER — IOPAMIDOL (ISOVUE-300) INJECTION 61%
INTRAVENOUS | Status: AC
  Administered 2016-06-03: 12:00:00
  Filled 2016-06-03: qty 75

## 2016-06-03 MED ORDER — ONDANSETRON 8 MG PO TBDP: 8.0000 mg | ORAL_TABLET | Freq: Three times a day (TID) | ORAL | 0 refills | Status: DC | PRN

## 2016-06-03 MED ORDER — MORPHINE SULFATE (PF) 4 MG/ML IV SOLN
4.0000 mg | Freq: Once | INTRAVENOUS | Status: AC
Start: 2016-06-03 — End: 2016-06-03
  Administered 2016-06-03: 4 mg via INTRAVENOUS
  Filled 2016-06-03: qty 1

## 2016-06-03 MED ORDER — HYDROCODONE-ACETAMINOPHEN 5-325 MG PO TABS: 1.0000 | ORAL_TABLET | ORAL | 0 refills | Status: DC | PRN

## 2016-06-03 MED ORDER — SODIUM CHLORIDE 0.9 % IV BOLUS (SEPSIS)
1000.0000 mL | Freq: Once | INTRAVENOUS | Status: AC
  Administered 2016-06-03: 1000 mL via INTRAVENOUS

## 2016-06-03 MED ORDER — IOPAMIDOL (ISOVUE-300) INJECTION 61%
75.0000 mL | Freq: Once | INTRAVENOUS | Status: AC | PRN
  Administered 2016-06-03: 75 mL via INTRAVENOUS

## 2016-06-03 NOTE — ED Notes (Signed)
Pt aware urine sample is needed 

## 2016-06-03 NOTE — ED Notes (Signed)
PATIENT AWARE THAT URINE SPECIMEN IS NEEDED. URINAL AT BEDSIDE.

## 2016-06-03 NOTE — ED Triage Notes (Signed)
Pt reports RLQ pain  Started last night. denies NV. denies diarrhea nor constipation. Alert and oriented x 4.

## 2016-06-03 NOTE — ED Provider Notes (Signed)
[facility] DEPT Provider Note   CSN: [place]:[id number] Arrival date & time: 06/03/16  [id number]     History   Chief Complaint Chief Complaint  Patient presents with  . Abdominal Pain    HPI Ralph Mendoza is a 83 y.o. male.  HPI Patient presents to the emergency department with increasing right-sided abdominal pain since last night.  Denies nausea and vomiting.  No diarrhea.  No rectal bleeding.  No prior history of abdominal pain like this.  He's never had abdominal surgery.  He still has his gallbladder and his appendix.  No recent injury or trauma.  Pain is moderate in severity worse with palpation of the right side of his abdomen.   Past Medical History:  Diagnosis Date  . Atrial fibrillation ([place])   . BPH (benign prostatic hyperplasia)   . Dyspnea on exertion    a. 09/2008 NL EF on myoview.  [name] Hearing difficulty    R sided hole in eardrum, only wears L hearing aid  . History of tobacco abuse   . Hypertension   . Lumbar stenosis with neurogenic claudication    a. 02/2011 s/p decompressive laminectomy.  . Midsternal chest pain    a. 09/2008 Myoview: EF 56%, mild fixed thinning of inf wall particularly @ base -? attenuation.  No ischemia  . PUD (peptic ulcer disease)    Remote, history of  . Urinary hesitancy     Patient Active Problem List   Diagnosis Date Noted  . Near syncope 04/07/2013  . Congestive dilated cardiomyopathy ([place]) 06/09/2012  . Atrial fibrillation ([place]) 05/17/2012  . Midsternal chest pain 09/21/2011  . Lumbar stenosis with neurogenic claudication 03/04/2011  . HYPERCHOLESTEROLEMIA-PURE 09/11/2008  . DYSPNEA 09/11/2008  . ABDOMINAL PAIN RIGHT LOWER QUADRANT 09/11/2008  . Essential hypertension 09/10/2008    Past Surgical History:  Procedure Laterality Date  . HERNIA REPAIR     Left  . LUMBAR LAMINECTOMY/DECOMPRESSION MICRODISCECTOMY  03/04/2011   Procedure: LUMBAR LAMINECTOMY/DECOMPRESSION MICRODISCECTOMY;  Surgeon: Ophelia Charter;  Location: [place]  [facility]UR[facility] OR[facility];  Service: Neurosurgery;  Laterality: N/A;  LUMBAR THREE-FOUR ,FOUR-FIVE LAMINECTOMIES  . TYMPANOMASTOIDECTOMY     Left modified radical with type 3 tympanoplasty       Home Medications    Prior to Admission medications   Medication Sig Start Date End Date Taking? Authorizing Provider  acetaminophen (TYLENOL) 325 MG tablet Take 650 mg by mouth every 6 (six) hours as needed for pain.   Yes Historical Provider, MD  ELIQUIS 5 MG TABS tablet TAKE 1 TABLET TWICE A DAY Patient taking differently: TAKE 5mg  by mouth TABLET TWICE A DAY 03/09/16  Yes Lelon Perla, MD  furosemide (LASIX) 40 MG tablet Take 1 tablet (40 mg total) by mouth daily. 01/02/16 06/03/16 Yes Isaiah Serge, NP  isosorbide mononitrate (IMDUR) 30 MG 24 hr tablet Take 1 tablet (30 mg total) by mouth daily. Patient taking differently: Take 30 mg by mouth at bedtime.  01/02/16 06/03/16 Yes Isaiah Serge, NP  lisinopril (PRINIVIL,ZESTRIL) 20 MG tablet Take 1 tablet (20 mg total) by mouth daily. 03/09/16  Yes Lelon Perla, MD  potassium chloride SA (K-DUR,KLOR-CON) 20 MEQ tablet Take 1 tablet (20 mEq total) by mouth daily. 05/11/16  Yes Lelon Perla, MD  ciprofloxacin (CIPRO) 500 MG tablet Take 1 tablet (500 mg total) by mouth 2 (two) times daily. 06/03/16   Jola Schmidt, MD  HYDROcodone-acetaminophen (NORCO/VICODIN) 5-325 MG tablet Take 1 tablet by mouth every 4 (four) hours as  needed for moderate pain. 06/03/16   Jola Schmidt, MD  metroNIDAZOLE (FLAGYL) 500 MG tablet Take 1 tablet (500 mg total) by mouth 2 (two) times daily. 06/03/16   Jola Schmidt, MD  ondansetron (ZOFRAN ODT) 8 MG disintegrating tablet Take 1 tablet (8 mg total) by mouth every 8 (eight) hours as needed for nausea or vomiting. 06/03/16   Jola Schmidt, MD    Family History Family History  Problem Relation Age of Onset  . Lung cancer Mother   . Appendicitis Father   . Diabetes Sister     DM  . Coronary artery disease Neg Hx     No premature CAD    . Colon cancer Neg Hx     Social History Social History  Substance Use Topics  . Smoking status: Former Research scientist (life sciences)  . Smokeless tobacco: Never Used     Comment: Remote history of smoking  . Alcohol use No     Allergies   Patient has no known allergies.   Review of Systems Review of Systems  All other systems reviewed and are negative.    Physical Exam Updated Vital Signs BP 135/73 (BP Location: Right Arm)   Pulse 69   Temp 98.4 F (36.9 C) (Oral)   Resp 24   SpO2 (!) 86%   Physical Exam  Constitutional: He is oriented to person, place, and time. He appears well-developed and well-nourished.  HENT:  Head: Normocephalic and atraumatic.  Eyes: EOM are normal.  Neck: Normal range of motion.  Cardiovascular: Normal rate, regular rhythm, normal heart sounds and intact distal pulses.   Pulmonary/Chest: Effort normal and breath sounds normal. No respiratory distress.  Abdominal: Soft. He exhibits no distension.  Mild right-sided abdominal tenderness without guarding or rebound.  Musculoskeletal: Normal range of motion.  Neurological: He is alert and oriented to person, place, and time.  Skin: Skin is warm and dry.  Psychiatric: He has a normal mood and affect. Judgment normal.  Nursing note and vitals reviewed.    ED Treatments / Results  Labs (all labs ordered are listed, but only abnormal results are displayed) Labs Reviewed  LIPASE, BLOOD - Abnormal; Notable for the following:       Result Value   Lipase 78 (*)    All other components within normal limits  COMPREHENSIVE METABOLIC PANEL - Abnormal; Notable for the following:    Sodium 134 (*)    Chloride 100 (*)    Glucose, Bld 144 (*)    BUN 53 (*)    Creatinine, Ser 1.51 (*)    ALT 16 (*)    Total Bilirubin 1.9 (*)    GFR calc non Af Amer 38 (*)    GFR calc Af Amer 44 (*)    All other components within normal limits  CBC - Abnormal; Notable for the following:    HCT 38.3 (*)    All other components  within normal limits  LACTIC ACID, PLASMA  URINALYSIS, ROUTINE W REFLEX MICROSCOPIC    EKG  EKG Interpretation None       Radiology Ct Abdomen Pelvis W Contrast  Result Date: 06/03/2016 CLINICAL DATA:  Right side abdominal pain. EXAM: CT ABDOMEN AND PELVIS WITH CONTRAST TECHNIQUE: Multidetector CT imaging of the abdomen and pelvis was performed using the standard protocol following bolus administration of intravenous contrast. CONTRAST:  2mL ISOVUE-300 IOPAMIDOL (ISOVUE-300) INJECTION 61% COMPARISON:  None. FINDINGS: Lower chest: Small moderate right pleural effusion with right base atelectasis. Left base is clear. Mild cardiomegaly.  Hepatobiliary: No focal hepatic abnormality. Gallbladder unremarkable. Pancreas: No focal abnormality or ductal dilatation. Spleen: No focal abnormality.  Normal size. Adrenals/Urinary Tract: No adrenal abnormality. No focal renal abnormality. No stones or hydronephrosis. Urinary bladder is unremarkable. Stomach/Bowel: Sigmoid diverticulosis and transverse colon diverticulosis. There is inflammatory stranding noted around the mid sigmoid colon in the right lower quadrant compatible with active diverticulitis. Stomach and small bowel decompressed, unremarkable. Vascular/Lymphatic: Diffuse aortic and iliac calcifications. No aneurysm. No adenopathy. Reproductive: No visible focal abnormality. Other: Trace free fluid in the pelvis. Bilateral inguinal hernias containing fat, left larger than right. Musculoskeletal: No acute bony abnormality. Diffuse degenerative changes and scoliosis in the lumbar spine. IMPRESSION: Moderate right pleural effusion. Transverse colon and sigmoid colon diverticulosis. Inflammatory stranding around the mid sigmoid colon compatible with active diverticulitis. Aortoiliac atherosclerosis. Bilateral inguinal hernias, left larger than right. Both containing fat. Electronically Signed   By: Rolm Baptise M.D.   On: 06/03/2016 11:11     Procedures Procedures (including critical care time)  Medications Ordered in ED Medications  morphine 4 MG/ML injection 4 mg (4 mg Intravenous Given 06/03/16 0954)  sodium chloride 0.9 % bolus 1,000 mL (0 mLs Intravenous Stopped 06/03/16 1056)  iopamidol (ISOVUE-300) 61 % injection 75 mL (75 mLs Intravenous Contrast Given 06/03/16 1052)  iopamidol (ISOVUE-300) 61 % injection (  Contrast Given 06/03/16 1154)  sodium chloride 0.9 % injection (  Given 06/03/16 1154)  ciprofloxacin (CIPRO) tablet 500 mg (500 mg Oral Given 06/03/16 1127)  metroNIDAZOLE (FLAGYL) tablet 500 mg (500 mg Oral Given 06/03/16 1127)     Initial Impression / Assessment and Plan / ED Course  I have reviewed the triage vital signs and the nursing notes.  Pertinent labs & imaging results that were available during my care of the patient were reviewed by me and considered in my medical decision making (see chart for details).     CT scan demonstrates active diverticulitis without perforation or abscess.  Home with Cipro and Flagyl.  He does have a small moderate right-sided pleural effusion.  This will need to be further evaluated as an outpatient.  He has no shortness of breath or respiratory symptoms.  No hypoxia.  Recorded O2 sat of 86% seems to be aberrant reading.  No hypoxia on my evaluation.  Final Clinical Impressions(s) / ED Diagnoses   Final diagnoses:  Diverticulitis of intestine without perforation or abscess without bleeding, unspecified part of intestinal tract    New Prescriptions New Prescriptions   CIPROFLOXACIN (CIPRO) 500 MG TABLET    Take 1 tablet (500 mg total) by mouth 2 (two) times daily.   HYDROCODONE-ACETAMINOPHEN (NORCO/VICODIN) 5-325 MG TABLET    Take 1 tablet by mouth every 4 (four) hours as needed for moderate pain.   METRONIDAZOLE (FLAGYL) 500 MG TABLET    Take 1 tablet (500 mg total) by mouth 2 (two) times daily.   ONDANSETRON (ZOFRAN ODT) 8 MG DISINTEGRATING TABLET    Take 1 tablet (8  mg total) by mouth every 8 (eight) hours as needed for nausea or vomiting.     Jola Schmidt, MD 06/03/16 1324

## 2016-06-19 NOTE — Progress Notes (Signed)
HPI: FU atrial fibrillation. We have elected rate control and anticoagulation for afib. Holter monitor in February of 2014 showed sinus rhythm, mobitz 1; toprol DCed. Echocardiogram August2017 showed ejection fraction 25-30%, moderate mitral regurgitation, biatrial enlargement and mild right ventricular enlargement. Pulmonary pressure moderately increased. Nuclear study September 2017 showed ejection fraction 28%. No ischemia. Fixed apical defect. Patient had CT scan January 2018 that showed large right and small left pleural effusion. Probable area of atelectasis in anterior right upper lobe but follow-up recommended in 2-3 months. BNP 318. Patient had thoracentesis January 18. Cytology negative. Since last seen, patient denies dyspnea, chest pain, palpitations or syncope.  Current Outpatient Prescriptions  Medication Sig Dispense Refill  . acetaminophen (TYLENOL) 325 MG tablet Take 650 mg by mouth every 6 (six) hours as needed for pain.    . ciprofloxacin (CIPRO) 500 MG tablet Take 1 tablet (500 mg total) by mouth 2 (two) times daily. 14 tablet 0  . ELIQUIS 5 MG TABS tablet TAKE 1 TABLET TWICE A DAY (Patient taking differently: TAKE 5mg  by mouth TABLET TWICE A DAY) 180 tablet 1  . HYDROcodone-acetaminophen (NORCO/VICODIN) 5-325 MG tablet Take 1 tablet by mouth every 4 (four) hours as needed for moderate pain. 15 tablet 0  . lisinopril (PRINIVIL,ZESTRIL) 20 MG tablet Take 1 tablet (20 mg total) by mouth daily. 180 tablet 1  . metroNIDAZOLE (FLAGYL) 500 MG tablet Take 1 tablet (500 mg total) by mouth 2 (two) times daily. 14 tablet 0  . ondansetron (ZOFRAN ODT) 8 MG disintegrating tablet Take 1 tablet (8 mg total) by mouth every 8 (eight) hours as needed for nausea or vomiting. 10 tablet 0  . potassium chloride SA (K-DUR,KLOR-CON) 20 MEQ tablet Take 1 tablet (20 mEq total) by mouth daily. 90 tablet 3  . furosemide (LASIX) 40 MG tablet Take 1 tablet (40 mg total) by mouth daily. 90 tablet 3  .  isosorbide mononitrate (IMDUR) 30 MG 24 hr tablet Take 1 tablet (30 mg total) by mouth daily. (Patient taking differently: Take 30 mg by mouth at bedtime. ) 90 tablet 3   No current facility-administered medications for this visit.      Past Medical History:  Diagnosis Date  . Atrial fibrillation ([place])   . BPH (benign prostatic hyperplasia)   . Dyspnea on exertion    a. 09/2008 NL EF on myoview.  [name] Hearing difficulty    R sided hole in eardrum, only wears L hearing aid  . History of tobacco abuse   . Hypertension   . Lumbar stenosis with neurogenic claudication    a. 02/2011 s/p decompressive laminectomy.  . Midsternal chest pain    a. 09/2008 Myoview: EF 56%, mild fixed thinning of inf wall particularly @ base -? attenuation.  No ischemia  . PUD (peptic ulcer disease)    Remote, history of  . Urinary hesitancy     Past Surgical History:  Procedure Laterality Date  . HERNIA REPAIR     Left  . LUMBAR LAMINECTOMY/DECOMPRESSION MICRODISCECTOMY  03/04/2011   Procedure: LUMBAR LAMINECTOMY/DECOMPRESSION MICRODISCECTOMY;  Surgeon: Ophelia Charter;  Location: [facility];  Service: Neurosurgery;  Laterality: N/A;  LUMBAR THREE-FOUR ,FOUR-FIVE LAMINECTOMIES  . TYMPANOMASTOIDECTOMY     Left modified radical with type 3 tympanoplasty    Social History   Social History  . Marital status: Widowed    Spouse name: N/A  . Number of children: 2  . Years of education: N/A   Occupational History  .  [employer]     Retired   Social History Main Topics  . Smoking status: Former Research scientist (life sciences)  . Smokeless tobacco: Never Used     Comment: Remote history of smoking  . Alcohol use No  . Drug use: No  . Sexual activity: Not on file   Other Topics Concern  . Not on file   Social History Narrative   Lives with his wife in [place].   He swims daily.   He has 2 grown daughters.   1 Caffeine drink daily     Family History  Problem Relation Age of Onset  . Lung cancer Mother    . Appendicitis Father   . Diabetes Sister     DM  . Coronary artery disease Neg Hx     No premature CAD  . Colon cancer Neg Hx     ROS: no fevers or chills, productive cough, hemoptysis, dysphasia, odynophagia, melena, hematochezia, dysuria, hematuria, rash, seizure activity, orthopnea, PND, pedal edema, claudication. Remaining systems are negative.  Physical Exam: Well-developed well-nourished in no acute distress.  Skin is warm and dry.  HEENT is normal.  Neck is supple. No bruits Chest is clear to auscultation with normal expansion.  Cardiovascular exam is regular rate and rhythm.  Abdominal exam nontender or distended. No masses palpated. Extremities show no edema. neuro grossly intact   A/P  1 Atrial fibrillation-rate is controlled on no medications. Continue apixaban.  2 chronic systolic congestive heart failure-recent blood work showed increasing BUN and creatinine. Patient may be becoming dehydrated. Discontinue potassium and decrease Lasix to 20 mg daily. Check potassium and renal function in 1 week. We may need to reduce dose of apixaban if Cr remains > 1.5.  3 cardiomyopathy-nuclear study did not suggest coronary disease. Given his age I would prefer to be conservative. Continue ACE inhibitor. No beta blocker given history of bradycardia.  4 hypertension-blood pressure controlled. Continue present medications.  5 hyperlipidemia-management per primary care.  Kirk Ruths, MD

## 2016-06-25 ENCOUNTER — Ambulatory Visit (INDEPENDENT_AMBULATORY_CARE_PROVIDER_SITE_OTHER): Payer: Medicare Other | Admitting: Cardiology

## 2016-06-25 ENCOUNTER — Encounter: Payer: Self-pay | Admitting: Cardiology

## 2016-06-25 VITALS — BP 127/63 | HR 66 | Ht 70.5 in | Wt 176.4 lb

## 2016-06-25 DIAGNOSIS — Z79899 Other long term (current) drug therapy: Secondary | ICD-10-CM | POA: Diagnosis not present

## 2016-06-25 DIAGNOSIS — I10 Essential (primary) hypertension: Secondary | ICD-10-CM | POA: Diagnosis not present

## 2016-06-25 DIAGNOSIS — I4891 Unspecified atrial fibrillation: Secondary | ICD-10-CM

## 2016-06-25 DIAGNOSIS — I429 Cardiomyopathy, unspecified: Secondary | ICD-10-CM

## 2016-06-25 MED ORDER — FUROSEMIDE 20 MG PO TABS: 20.0000 mg | ORAL_TABLET | Freq: Every day | ORAL | 3 refills | Status: DC

## 2016-06-25 NOTE — Patient Instructions (Signed)
Medication Instructions: Dr Stanford Breed has recommended making the following medication changes: 1. DECREASE Furosemide to 20 mg daily 2. STOP Potassium  Labwork: Your physician recommends that you return for lab work in 1 week at our [facility] location. Please schedule an appointment.  Testing/Procedures: NONE ORDERED  Follow-up: Dr Stanford Breed recommends that you schedule a follow-up appointment in 3 months.  If you need a refill on your cardiac medications before your next appointment, please call your pharmacy.

## 2016-07-07 LAB — BASIC METABOLIC PANEL
BUN / CREAT RATIO: 20 (ref 10–24)
BUN: 26 mg/dL (ref 10–36)
CO2: 28 mmol/L (ref 18–29)
Calcium: 8.9 mg/dL (ref 8.6–10.2)
Chloride: 98 mmol/L (ref 96–106)
Creatinine, Ser: 1.29 mg/dL — ABNORMAL HIGH (ref 0.76–1.27)
GFR calc non Af Amer: 47 mL/min/{1.73_m2} — ABNORMAL LOW (ref >59)
GFR, EST AFRICAN AMERICAN: 54 mL/min/{1.73_m2} — AB (ref >59)
Glucose: 118 mg/dL — ABNORMAL HIGH (ref 65–99)
Potassium: 4.2 mmol/L (ref 3.5–5.2)
SODIUM: 137 mmol/L (ref 134–144)

## 2016-09-04 ENCOUNTER — Other Ambulatory Visit: Payer: Self-pay | Admitting: Cardiology

## 2016-09-27 ENCOUNTER — Encounter (HOSPITAL_COMMUNITY): Payer: Self-pay | Admitting: Emergency Medicine

## 2016-09-27 ENCOUNTER — Inpatient Hospital Stay (HOSPITAL_COMMUNITY)
Admission: EM | Admit: 2016-09-27 | Discharge: 2016-09-29 | DRG: 292 | Disposition: A | Discharge status: Home / Self Care | Payer: Medicare Other | Attending: Internal Medicine | Admitting: Internal Medicine

## 2016-09-27 ENCOUNTER — Emergency Department (HOSPITAL_COMMUNITY): Payer: Medicare Other

## 2016-09-27 DIAGNOSIS — Z9889 Other specified postprocedural states: Secondary | ICD-10-CM | POA: Diagnosis not present

## 2016-09-27 DIAGNOSIS — D638 Anemia in other chronic diseases classified elsewhere: Secondary | ICD-10-CM | POA: Diagnosis present

## 2016-09-27 DIAGNOSIS — I5022 Chronic systolic (congestive) heart failure: Secondary | ICD-10-CM | POA: Diagnosis present

## 2016-09-27 DIAGNOSIS — N183 Chronic kidney disease, stage 3 unspecified: Secondary | ICD-10-CM | POA: Diagnosis present

## 2016-09-27 DIAGNOSIS — Z7901 Long term (current) use of anticoagulants: Secondary | ICD-10-CM

## 2016-09-27 DIAGNOSIS — I130 Hypertensive heart and chronic kidney disease with heart failure and stage 1 through stage 4 chronic kidney disease, or unspecified chronic kidney disease: Principal | ICD-10-CM | POA: Diagnosis present

## 2016-09-27 DIAGNOSIS — N400 Enlarged prostate without lower urinary tract symptoms: Secondary | ICD-10-CM | POA: Diagnosis present

## 2016-09-27 DIAGNOSIS — R531 Weakness: Secondary | ICD-10-CM | POA: Diagnosis present

## 2016-09-27 DIAGNOSIS — M48062 Spinal stenosis, lumbar region with neurogenic claudication: Secondary | ICD-10-CM | POA: Diagnosis present

## 2016-09-27 DIAGNOSIS — Z87891 Personal history of nicotine dependence: Secondary | ICD-10-CM

## 2016-09-27 DIAGNOSIS — I4891 Unspecified atrial fibrillation: Secondary | ICD-10-CM | POA: Diagnosis present

## 2016-09-27 DIAGNOSIS — J90 Pleural effusion, not elsewhere classified: Secondary | ICD-10-CM | POA: Diagnosis present

## 2016-09-27 DIAGNOSIS — R0602 Shortness of breath: Secondary | ICD-10-CM

## 2016-09-27 DIAGNOSIS — Z79899 Other long term (current) drug therapy: Secondary | ICD-10-CM | POA: Diagnosis not present

## 2016-09-27 DIAGNOSIS — D649 Anemia, unspecified: Secondary | ICD-10-CM | POA: Diagnosis present

## 2016-09-27 DIAGNOSIS — I10 Essential (primary) hypertension: Secondary | ICD-10-CM | POA: Diagnosis not present

## 2016-09-27 DIAGNOSIS — I482 Chronic atrial fibrillation: Secondary | ICD-10-CM | POA: Diagnosis present

## 2016-09-27 LAB — COMPREHENSIVE METABOLIC PANEL
ALT: 20 U/L (ref 17–63)
ANION GAP: 10 (ref 5–15)
AST: 28 U/L (ref 15–41)
Albumin: 3.7 g/dL (ref 3.5–5.0)
Alkaline Phosphatase: 59 U/L (ref 38–126)
BUN: 26 mg/dL — ABNORMAL HIGH (ref 6–20)
CHLORIDE: 103 mmol/L (ref 101–111)
CO2: 24 mmol/L (ref 22–32)
CREATININE: 1.48 mg/dL — AB (ref 0.61–1.24)
Calcium: 8.7 mg/dL — ABNORMAL LOW (ref 8.9–10.3)
GFR, EST AFRICAN AMERICAN: 45 mL/min — AB (ref >60)
GFR, EST NON AFRICAN AMERICAN: 39 mL/min — AB (ref >60)
Glucose, Bld: 127 mg/dL — ABNORMAL HIGH (ref 65–99)
Potassium: 3.6 mmol/L (ref 3.5–5.1)
SODIUM: 137 mmol/L (ref 135–145)
Total Bilirubin: 1.6 mg/dL — ABNORMAL HIGH (ref 0.3–1.2)
Total Protein: 6.8 g/dL (ref 6.5–8.1)

## 2016-09-27 LAB — MRSA PCR SCREENING: MRSA by PCR: NEGATIVE

## 2016-09-27 LAB — CBC
HCT: 33.4 % — ABNORMAL LOW (ref 39.0–52.0)
Hemoglobin: 11.4 g/dL — ABNORMAL LOW (ref 13.0–17.0)
MCH: 31.5 pg (ref 26.0–34.0)
MCHC: 34.1 g/dL (ref 30.0–36.0)
MCV: 92.3 fL (ref 78.0–100.0)
PLATELETS: 164 10*3/uL (ref 150–400)
RBC: 3.62 MIL/uL — ABNORMAL LOW (ref 4.22–5.81)
RDW: 14.4 % (ref 11.5–15.5)
WBC: 4 10*3/uL (ref 4.0–10.5)

## 2016-09-27 LAB — TROPONIN I
TROPONIN I: 0.05 ng/mL — AB (ref <0.03)
Troponin I: 0.05 ng/mL (ref <0.03)
Troponin I: 0.05 ng/mL (ref <0.03)

## 2016-09-27 LAB — BRAIN NATRIURETIC PEPTIDE: B Natriuretic Peptide: 438.2 pg/mL — ABNORMAL HIGH (ref 0.0–100.0)

## 2016-09-27 MED ORDER — IOPAMIDOL (ISOVUE-300) INJECTION 61%
INTRAVENOUS | Status: AC
  Filled 2016-09-27: qty 75

## 2016-09-27 MED ORDER — ONDANSETRON HCL 4 MG/2ML IJ SOLN: 4.0000 mg | Freq: Four times a day (QID) | INTRAMUSCULAR | Status: DC | PRN

## 2016-09-27 MED ORDER — IPRATROPIUM-ALBUTEROL 0.5-2.5 (3) MG/3ML IN SOLN
3.0000 mL | RESPIRATORY_TRACT | Status: DC | PRN
Start: 2016-09-27 — End: 2016-09-29

## 2016-09-27 MED ORDER — ONDANSETRON HCL 4 MG PO TABS: 4.0000 mg | ORAL_TABLET | Freq: Four times a day (QID) | ORAL | Status: DC | PRN

## 2016-09-27 MED ORDER — DEXTROMETHORPHAN-GUAIFENESIN 10-100 MG/5ML PO LIQD
5.0000 mL | Freq: Every day | ORAL | Status: DC | PRN
  Administered 2016-09-28: 5 mL via ORAL
  Filled 2016-09-27: qty 5

## 2016-09-27 MED ORDER — ACETAMINOPHEN 500 MG PO TABS: 1000.0000 mg | ORAL_TABLET | Freq: Four times a day (QID) | ORAL | Status: DC | PRN

## 2016-09-27 MED ORDER — IOPAMIDOL (ISOVUE-300) INJECTION 61%
75.0000 mL | Freq: Once | INTRAVENOUS | Status: AC | PRN
  Administered 2016-09-27: 60 mL via INTRAVENOUS

## 2016-09-27 MED ORDER — APIXABAN 5 MG PO TABS
5.0000 mg | ORAL_TABLET | Freq: Two times a day (BID) | ORAL | Status: DC
Start: 2016-09-27 — End: 2016-09-28
  Administered 2016-09-27: 5 mg via ORAL
  Filled 2016-09-27: qty 1

## 2016-09-27 MED ORDER — SODIUM CHLORIDE 0.9 % IV SOLN
INTRAVENOUS | Status: DC
  Administered 2016-09-27 – 2016-09-28 (×2): via INTRAVENOUS

## 2016-09-27 MED ORDER — ISOSORBIDE MONONITRATE ER 30 MG PO TB24
30.0000 mg | ORAL_TABLET | Freq: Every day | ORAL | Status: DC
  Administered 2016-09-27 – 2016-09-28 (×2): 30 mg via ORAL
  Filled 2016-09-27 (×2): qty 1

## 2016-09-27 MED ORDER — FUROSEMIDE 20 MG PO TABS
20.0000 mg | ORAL_TABLET | Freq: Every day | ORAL | Status: DC
  Administered 2016-09-27 – 2016-09-28 (×2): 20 mg via ORAL
  Filled 2016-09-27 (×2): qty 1

## 2016-09-27 NOTE — H&P (Signed)
History and Physical    FREMAN LAPAGE YQM:[id number] DOB: [birth year] DOA: 09/27/2016  Referring MD/NP/PA: Dr. Venora Maples   PCP: [name], MD   Outpatient Specialists: Cardio, Dr. Kirk Ruths  Patient coming from: home  Chief Complaint: shortness of breath   HPI: Ralph Mendoza is a 83 y.o. male with medical history significant for right and left pleural effusion, hypertension, chronic systolic CHF (ECHO in 5284 with EF 25%) presented to ED with worsening shortness of breath over past days to a week or so prior to this admission associated with intermittent non productive cough. No fevers, no chills. Np chest pain or palpitations. No leg swelling. Shortness of breath is present at rest and with exertion. No abdominal pain, nausea or vomiting. NO GU complaints.   ED Course: Pt was hemodynamically stable in ED, BP was 111/74, normal oxygen saturation. Blood work showed hgb 11.4, creatinine 1.48, troponin 0.05. CXR and CT chest showed right pleural effusion and pt admitted for management of this pleural effusion. She will need thoracentesis during this hospital stay.   Review of Systems:  Constitutional: Negative for fever, chills, diaphoresis, activity change, appetite change and fatigue.  HENT: Negative for ear pain, nosebleeds, congestion, facial swelling, rhinorrhea, neck pain, neck stiffness and ear discharge.   Eyes: Negative for pain, discharge, redness, itching and visual disturbance.  Respiratory: per HPI Cardiovascular: Negative for chest pain, palpitations and leg swelling.  Gastrointestinal: Negative for abdominal distention.  Genitourinary: Negative for dysuria, urgency, frequency, hematuria, flank pain, decreased urine volume, difficulty urinating and dyspareunia.  Musculoskeletal: Negative for back pain, joint swelling, arthralgias and gait problem.  Neurological: Negative for dizziness, tremors, seizures, syncope, facial asymmetry, speech difficulty, weakness,  light-headedness, numbness and headaches.  Hematological: Negative for adenopathy. Does not bruise/bleed easily.  Psychiatric/Behavioral: Negative for hallucinations, behavioral problems, confusion, dysphoric mood, decreased concentration and agitation.   Past Medical History:  Diagnosis Date  . Atrial fibrillation ([place])   . BPH (benign prostatic hyperplasia)   . Dyspnea on exertion    a. 09/2008 NL EF on myoview.  [name] Hearing difficulty    R sided hole in eardrum, only wears L hearing aid  . History of tobacco abuse   . Hypertension   . Lumbar stenosis with neurogenic claudication    a. 02/2011 s/p decompressive laminectomy.  . Midsternal chest pain    a. 09/2008 Myoview: EF 56%, mild fixed thinning of inf wall particularly @ base -? attenuation.  No ischemia  . PUD (peptic ulcer disease)    Remote, history of  . Urinary hesitancy     Past Surgical History:  Procedure Laterality Date  . HERNIA REPAIR     Left  . LUMBAR LAMINECTOMY/DECOMPRESSION MICRODISCECTOMY  03/04/2011   Procedure: LUMBAR LAMINECTOMY/DECOMPRESSION MICRODISCECTOMY;  Surgeon: Ophelia Charter;  Location: [facility];  Service: Neurosurgery;  Laterality: N/A;  LUMBAR THREE-FOUR ,FOUR-FIVE LAMINECTOMIES  . TYMPANOMASTOIDECTOMY     Left modified radical with type 3 tympanoplasty    Social history:  reports that he has quit smoking. He has never used smokeless tobacco. He reports that he does not drink alcohol or use drugs.  Ambulation: ambulates with cane and walker as needed   No Known Allergies  Family History  Problem Relation Age of Onset  . Lung cancer Mother   . Appendicitis Father   . Diabetes Sister        DM  . Coronary artery disease Neg Hx        No premature CAD  .  Colon cancer Neg Hx     Prior to Admission medications   Medication Sig Start Date End Date Taking? Authorizing Provider  acetaminophen (TYLENOL) 500 MG tablet Take 1,000 mg by mouth every 6 (six) hours as needed for mild pain  or moderate pain.   Yes [provider]  Dextromethorphan-Guaifenesin (DELSYM COUGH/CHEST CONGEST DM PO) Take 10 mLs by mouth daily as needed (for cough).    Yes [provider]  ELIQUIS 5 MG TABS tablet TAKE 1 TABLET TWICE A DAY Patient taking differently: TAKE 5mg  by mouth TABLET TWICE A DAY 03/09/16  Yes Crenshaw, Denice Bors, MD  furosemide (LASIX) 20 MG tablet Take 1 tablet (20 mg total) by mouth daily. 06/25/16 09/27/16 Yes Lelon Perla, MD  isosorbide mononitrate (IMDUR) 30 MG 24 hr tablet Take 1 tablet (30 mg total) by mouth daily. Patient taking differently: Take 30 mg by mouth at bedtime.  01/02/16 09/27/16 Yes Isaiah Serge, NP  lisinopril (PRINIVIL,ZESTRIL) 20 MG tablet Take 1 tablet (20 mg total) by mouth daily. 03/09/16  Yes Lelon Perla, MD  ciprofloxacin (CIPRO) 500 MG tablet Take 1 tablet (500 mg total) by mouth 2 (two) times daily. Patient not taking: Reported on 09/27/2016 06/03/16   Jola Schmidt, MD  ELIQUIS 5 MG TABS tablet TAKE 1 TABLET TWICE A DAY Patient not taking: Reported on 09/27/2016 09/04/16   Lelon Perla, MD  HYDROcodone-acetaminophen (NORCO/VICODIN) 5-325 MG tablet Take 1 tablet by mouth every 4 (four) hours as needed for moderate pain. Patient not taking: Reported on 09/27/2016 06/03/16   Jola Schmidt, MD  metroNIDAZOLE (FLAGYL) 500 MG tablet Take 1 tablet (500 mg total) by mouth 2 (two) times daily. Patient not taking: Reported on 09/27/2016 06/03/16   Jola Schmidt, MD  ondansetron (ZOFRAN ODT) 8 MG disintegrating tablet Take 1 tablet (8 mg total) by mouth every 8 (eight) hours as needed for nausea or vomiting. Patient not taking: Reported on 09/27/2016 06/03/16   Jola Schmidt, MD    Physical Exam: Vitals:   09/27/16 0947 09/27/16 1000 09/27/16 1100 09/27/16 1234  BP: 138/65 (!) 128/108 116/64 111/74  Pulse: 63 77 (!) 57 60  Resp: 20 18 15 18   Temp: 97.4 F (36.3 C) 98 F (36.7 C)    TempSrc: Oral Oral    SpO2: 95% 99% 95% 95%     Constitutional: NAD, calm, comfortable Vitals:   09/27/16 0947 09/27/16 1000 09/27/16 1100 09/27/16 1234  BP: 138/65 (!) 128/108 116/64 111/74  Pulse: 63 77 (!) 57 60  Resp: 20 18 15 18   Temp: 97.4 F (36.3 C) 98 F (36.7 C)    TempSrc: Oral Oral    SpO2: 95% 99% 95% 95%   Eyes: PERRL, lids and conjunctivae normal ENMT: Mucous membranes are moist. Posterior pharynx clear of any exudate or lesions.Normal dentition.  Neck: normal, supple, no masses, no thyromegaly Respiratory: diminished breath sounds on the right, no wheezing no left side  Cardiovascular: irregular rhythm, appreciate S1, S 2 Abdomen: no tenderness, no masses palpated. No hepatosplenomegaly. Bowel sounds positive.  Musculoskeletal: no clubbing / cyanosis. No joint deformity upper and lower extremities. Good ROM, no contractures. Normal muscle tone.  Skin: no rashes, lesions, ulcers. No induration Neurologic: CN 2-12 grossly intact. Sensation intact, DTR normal. Strength 5/5 in all 4.  Psychiatric: Normal judgment and insight. Alert and oriented x 3. Normal mood.    Labs on Admission: I have personally reviewed following labs and imaging studies  CBC:  Recent  Labs Lab 09/27/16 1015  WBC 4.0  HGB 11.4*  HCT 33.4*  MCV 92.3  PLT 638   Basic Metabolic Panel:  Recent Labs Lab 09/27/16 1015  NA 137  K 3.6  CL 103  CO2 24  GLUCOSE 127*  BUN 26*  CREATININE 1.48*  CALCIUM 8.7*   GFR: CrCl cannot be calculated (Unknown ideal weight.). Liver Function Tests:  Recent Labs Lab 09/27/16 1015  AST 28  ALT 20  ALKPHOS 59  BILITOT 1.6*  PROT 6.8  ALBUMIN 3.7   No results for input(s): LIPASE, AMYLASE in the last 168 hours. No results for input(s): AMMONIA in the last 168 hours. Coagulation Profile: No results for input(s): INR, PROTIME in the last 168 hours. Cardiac Enzymes:  Recent Labs Lab 09/27/16 1015  TROPONINI 0.05*   BNP (last 3 results) No results for input(s): PROBNP in the  last 8760 hours. HbA1C: No results for input(s): HGBA1C in the last 72 hours. CBG: No results for input(s): GLUCAP in the last 168 hours. Lipid Profile: No results for input(s): CHOL, HDL, LDLCALC, TRIG, CHOLHDL, LDLDIRECT in the last 72 hours. Thyroid Function Tests: No results for input(s): TSH, T4TOTAL, FREET4, T3FREE, THYROIDAB in the last 72 hours. Anemia Panel: Urine analysis:  Sepsis Labs: @LABRCNTIP (procalcitonin:4,lacticidven:4) )No results found for this or any previous visit (from the past 240 hour(s)).   Radiological Exams on Admission: Dg Chest 2 View Result Date: 09/27/2016 Moderate right pleural effusion, increased. Underlying right lower lobe opacity, likely compressive atelectasis.   Ct Chest W Contrast Result Date: 09/27/2016 1. Continued moderate to large layering right pleural effusion since January with compressive right lung atelectasis, including subtotal atelectasis of the right lower lobe. 2. Regressed and nearly resolved smaller left pleural effusion since January. Completely resolved anterior left upper lobe opacity described on the January CT. No new pulmonary abnormality. 3. Stable moderate cardiomegaly. Calcified aortic and coronary artery atherosclerosis.   EKG: pending   Assessment/Plan  Active Problems: Pleural effusion on right - CXR showed right pleural effusion - CT chest showed moderate to large layering right pleural effusion since January - IR consulted for thoracentesis, appreciate their help very much - Continue supportive care for now - Use bronchodilators as needed   Atrial fibrillation, chronic - CHADS vasc score - On apixaban for anticoagulation - ECHO in 2017 showed EF 46-65%  Chronic systolic CHF - ECHO in 9935 showed EF 25-30% - Resumed lasix    CKD stage 3 - Cr 1.57 in 07/2015 - Cr on this admission 1.48, within baseline range   Anemia of chronic disease - Due to CKD - Hgb stable  Mild troponin elevation - Trop  level 0.05, likely demand ischemia from CKD  - No chest pain - Repeat trop level   Essential hypertension - Resume Imdur and lasix and hold lisinopril as BP on softer side and may drop further with more antihypertensives    DVT prophylaxis: On Apixaban Code Status: full code Family Communication: no family at the bedside Disposition Plan: admission to medical floor Consults called: IR   Disposition plan Further plan will depend as patient's clinical course evolves and further radiologic and laboratory data become available.    At the time of admission, it appears that the appropriate admission status for this patient is INPATIENT .Thisis judged to be reasonable and necessary in order to provide the required intensity of service to ensure the patient's safetygiven the patient presentation of right pleural effusion and need for thoracentesis in addition  to physical exam findings, radiographic and laboratory data in the context of chronic comorbidities.     Leisa Lenz MD [facility] Pager [phone]  If 7PM-7AM, please contact night-coverage [email or web address] Password [email or web address]  09/27/2016, 1:28 PM

## 2016-09-27 NOTE — ED Notes (Signed)
Patient transported to CT 

## 2016-09-27 NOTE — ED Provider Notes (Signed)
[facility] DEPT Provider Note   CSN: [id number] Arrival date & time: 09/27/16  4098     History   Chief Complaint Chief Complaint  Patient presents with  . Shortness of Breath    HPI Ralph Mendoza is a 83 y.o. male.  HPI Pt presents with increasing SOB for the past 1 month without orthopnea. Hx of CHF and afib on anticoagulation. No fevers. + cough. No unilateral leg swelling. Exertional SOB without chest pain. Symptoms are moderate in severity. Had to stop 3 times yesterday while walking.     Past Medical History:  Diagnosis Date  . Atrial fibrillation ([place])   . BPH (benign prostatic hyperplasia)   . Dyspnea on exertion    a. 09/2008 NL EF on myoview.  [name] Hearing difficulty    R sided hole in eardrum, only wears L hearing aid  . History of tobacco abuse   . Hypertension   . Lumbar stenosis with neurogenic claudication    a. 02/2011 s/p decompressive laminectomy.  . Midsternal chest pain    a. 09/2008 Myoview: EF 56%, mild fixed thinning of inf wall particularly @ base -? attenuation.  No ischemia  . PUD (peptic ulcer disease)    Remote, history of  . Urinary hesitancy     Patient Active Problem List   Diagnosis Date Noted  . Near syncope 04/07/2013  . Congestive dilated cardiomyopathy ([place]) 06/09/2012  . Atrial fibrillation ([place]) 05/17/2012  . Midsternal chest pain 09/21/2011  . Lumbar stenosis with neurogenic claudication 03/04/2011  . HYPERCHOLESTEROLEMIA-PURE 09/11/2008  . DYSPNEA 09/11/2008  . ABDOMINAL PAIN RIGHT LOWER QUADRANT 09/11/2008  . Essential hypertension 09/10/2008    Past Surgical History:  Procedure Laterality Date  . HERNIA REPAIR     Left  . LUMBAR LAMINECTOMY/DECOMPRESSION MICRODISCECTOMY  03/04/2011   Procedure: LUMBAR LAMINECTOMY/DECOMPRESSION MICRODISCECTOMY;  Surgeon: Ophelia Charter;  Location: [facility];  Service: Neurosurgery;  Laterality: N/A;  LUMBAR THREE-FOUR ,FOUR-FIVE LAMINECTOMIES  . TYMPANOMASTOIDECTOMY     Left  modified radical with type 3 tympanoplasty       Home Medications    Prior to Admission medications   Medication Sig Start Date End Date Taking? Authorizing Provider  acetaminophen (TYLENOL) 500 MG tablet Take 1,000 mg by mouth every 6 (six) hours as needed for mild pain or moderate pain.   Yes [provider]  Dextromethorphan-Guaifenesin (DELSYM COUGH/CHEST CONGEST DM PO) Take 10 mLs by mouth daily as needed (for cough).    Yes [provider]  ELIQUIS 5 MG TABS tablet TAKE 1 TABLET TWICE A DAY Patient taking differently: TAKE 5mg  by mouth TABLET TWICE A DAY 03/09/16  Yes Crenshaw, Denice Bors, MD  furosemide (LASIX) 20 MG tablet Take 1 tablet (20 mg total) by mouth daily. 06/25/16 09/27/16 Yes Lelon Perla, MD  isosorbide mononitrate (IMDUR) 30 MG 24 hr tablet Take 1 tablet (30 mg total) by mouth daily. Patient taking differently: Take 30 mg by mouth at bedtime.  01/02/16 09/27/16 Yes Isaiah Serge, NP  lisinopril (PRINIVIL,ZESTRIL) 20 MG tablet Take 1 tablet (20 mg total) by mouth daily. 03/09/16  Yes Lelon Perla, MD  ciprofloxacin (CIPRO) 500 MG tablet Take 1 tablet (500 mg total) by mouth 2 (two) times daily. Patient not taking: Reported on 09/27/2016 06/03/16   Jola Schmidt, MD  ELIQUIS 5 MG TABS tablet TAKE 1 TABLET TWICE A DAY Patient not taking: Reported on 09/27/2016 09/04/16   Lelon Perla, MD  HYDROcodone-acetaminophen (NORCO/VICODIN) 5-325 MG  tablet Take 1 tablet by mouth every 4 (four) hours as needed for moderate pain. Patient not taking: Reported on 09/27/2016 06/03/16   Jola Schmidt, MD  metroNIDAZOLE (FLAGYL) 500 MG tablet Take 1 tablet (500 mg total) by mouth 2 (two) times daily. Patient not taking: Reported on 09/27/2016 06/03/16   Jola Schmidt, MD  ondansetron (ZOFRAN ODT) 8 MG disintegrating tablet Take 1 tablet (8 mg total) by mouth every 8 (eight) hours as needed for nausea or vomiting. Patient not taking: Reported on 09/27/2016 06/03/16    Jola Schmidt, MD    Family History Family History  Problem Relation Age of Onset  . Lung cancer Mother   . Appendicitis Father   . Diabetes Sister        DM  . Coronary artery disease Neg Hx        No premature CAD  . Colon cancer Neg Hx     Social History Social History  Substance Use Topics  . Smoking status: Former Research scientist (life sciences)  . Smokeless tobacco: Never Used     Comment: Remote history of smoking  . Alcohol use No     Allergies   Patient has no known allergies.   Review of Systems Review of Systems  All other systems reviewed and are negative.    Physical Exam Updated Vital Signs BP 111/74 (BP Location: Right Arm)   Pulse 60   Temp 98 F (36.7 C) (Oral)   Resp 18   SpO2 95%   Physical Exam  Constitutional: He is oriented to person, place, and time. He appears well-developed and well-nourished.  HENT:  Head: Normocephalic and atraumatic.  Eyes: EOM are normal.  Neck: Normal range of motion.  Cardiovascular: Normal rate, regular rhythm and normal heart sounds.   Pulmonary/Chest: Effort normal. No respiratory distress.  Decreased breath sounds on the right   Abdominal: Soft. He exhibits no distension. There is no tenderness.  Musculoskeletal: Normal range of motion.  Neurological: He is alert and oriented to person, place, and time.  Skin: Skin is warm and dry.  Psychiatric: He has a normal mood and affect. Judgment normal.  Nursing note and vitals reviewed.    ED Treatments / Results  Labs (all labs ordered are listed, but only abnormal results are displayed) Labs Reviewed  CBC - Abnormal; Notable for the following:       Result Value   RBC 3.62 (*)    Hemoglobin 11.4 (*)    HCT 33.4 (*)    All other components within normal limits  COMPREHENSIVE METABOLIC PANEL - Abnormal; Notable for the following:    Glucose, Bld 127 (*)    BUN 26 (*)    Creatinine, Ser 1.48 (*)    Calcium 8.7 (*)    Total Bilirubin 1.6 (*)    GFR calc non Af Amer 39 (*)     GFR calc Af Amer 45 (*)    All other components within normal limits  TROPONIN I - Abnormal; Notable for the following:    Troponin I 0.05 (*)    All other components within normal limits  BRAIN NATRIURETIC PEPTIDE - Abnormal; Notable for the following:    B Natriuretic Peptide 438.2 (*)    All other components within normal limits    EKG  EKG Interpretation  Date/Time:  Sunday September 27 2016 10:27:07 EDT Ventricular Rate:  59 PR Interval:    QRS Duration: 110 QT Interval:  516 QTC Calculation: 512 R Axis:   138 Text  Interpretation:  Atrial fibrillation Paired ventricular premature complexes Probable RVH w/ secondary repol abnormality Abnormal T, consider ischemia, lateral leads Prolonged QT interval No significant change was found Confirmed by Jola Schmidt [id number]) on 09/27/2016 12:08:43 PM       Radiology Dg Chest 2 View  Result Date: 09/27/2016 CLINICAL DATA:  Congestion, shortness of breath x1 month EXAM: CHEST  2 VIEW COMPARISON:  05/07/2016 FINDINGS: Moderate right pleural effusion, increased. Underlying right lower lobe opacity, likely compressive atelectasis. Left lung is clear. No pneumothorax. The heart is normal in size. Degenerative changes of the thoracic spine. IMPRESSION: Moderate right pleural effusion, increased. Underlying right lower lobe opacity, likely compressive atelectasis. Electronically Signed   By: Julian Hy M.D.   On: 09/27/2016 10:51   Ct Chest W Contrast  Result Date: 09/27/2016 CLINICAL DATA:  83 year old male with shortness of breath and nonproductive cough for 1 month. Right pleural effusion. EXAM: CT CHEST WITH CONTRAST TECHNIQUE: Multidetector CT imaging of the chest was performed during intravenous contrast administration. CONTRAST:  73mL ISOVUE-300 IOPAMIDOL (ISOVUE-300) INJECTION 61% COMPARISON:  Chest radiographs 01042 hours today and earlier. Chest CT 04/29/2016. FINDINGS: Cardiovascular: Stable cardiomegaly with generalize chamber  enlargement. No pericardial effusion. Soft and calcified atherosclerosis of the aorta again noted. Calcified coronary artery atherosclerosis. Stable thoracic aorta and other visible major mediastinal vascular structures. 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 Mediastinum/Nodes: Negative.  No lymphadenopathy. Lungs/Pleura: Moderate to large layering right pleural effusion has simple fluid density and appears stable in volume since the January chest CT. A small component of the effusion is sub pulmonic at the right lung base as before. No associated pleural thickening or nodularity. A small left pleural effusion has regressed and nearly resolved since January. Compressive atelectasis throughout the right lung, with subtotal atelectasis of the right lower lobe stable in appearance to that in January. Layering right middle lobe atelectasis along the lower major fissure is stable. Major airways remain patent. Mildly improved left lung ventilation compared to the prior CT. The anterior left upper lobe confluent opacity described on the January CT has completely resolved and was atelectasis or inflammatory. No new or suspicious pulmonary opacity. Upper Abdomen: Mild enlargement of the hepatic IVC and proximal ectatic veins is stable. Mild hepatic vein contrast reflux. Otherwise negative visible liver, spleen, pancreas, adrenal glands, kidneys, and bowel in the upper abdomen. Musculoskeletal: No acute osseous abnormality identified. Degenerative changes in the thoracic spine. IMPRESSION: 1. Continued moderate to large layering right pleural effusion since January with compressive right lung atelectasis, including subtotal atelectasis of the right lower lobe. 2. Regressed and nearly resolved smaller left pleural  effusion since January. Completely resolved anterior left upper lobe opacity described on the January CT. No new pulmonary abnormality. 3. Stable moderate cardiomegaly. Calcified aortic and coronary artery atherosclerosis. Electronically Signed   By: Genevie Ann M.D.   On: 09/27/2016 12:20    Procedures Procedures (including critical care time)  Medications Ordered in ED Medications  iopamidol (ISOVUE-300) 61 % injection (not administered)  iopamidol (ISOVUE-300) 61 % injection 75 mL (60 mLs Intravenous Contrast Given 09/27/16 1151)     Initial Impression / Assessment and Plan / ED Course  I have reviewed the triage vital signs and the nursing notes.  Pertinent labs & imaging results that were available during my care of the  patient were reviewed by me and considered in my medical decision making (see chart for details).     Worsening right sided pleural effusion with increasing SOB. Will need admission for thoracentesis in the AM.   Final Clinical Impressions(s) / ED Diagnoses   Final diagnoses:  Recurrent right pleural effusion  SOB (shortness of breath)  Weakness    New Prescriptions New Prescriptions   No medications on file     Jola Schmidt, MD 09/27/16 1315

## 2016-09-27 NOTE — ED Triage Notes (Signed)
Patient here from home with complaints of sob and cough for 1 month. Reports that he is unable to cough up sputum in his chest. Denies chest pain.

## 2016-09-27 NOTE — ED Notes (Addendum)
Notified Dr Venora Maples and Radene Ou, RN of pt critical troponin value

## 2016-09-28 ENCOUNTER — Inpatient Hospital Stay (HOSPITAL_COMMUNITY): Payer: Medicare Other

## 2016-09-28 DIAGNOSIS — I482 Chronic atrial fibrillation: Secondary | ICD-10-CM

## 2016-09-28 DIAGNOSIS — Z9889 Other specified postprocedural states: Secondary | ICD-10-CM

## 2016-09-28 DIAGNOSIS — D638 Anemia in other chronic diseases classified elsewhere: Secondary | ICD-10-CM

## 2016-09-28 DIAGNOSIS — J90 Pleural effusion, not elsewhere classified: Secondary | ICD-10-CM

## 2016-09-28 DIAGNOSIS — N183 Chronic kidney disease, stage 3 (moderate): Secondary | ICD-10-CM

## 2016-09-28 DIAGNOSIS — R0602 Shortness of breath: Secondary | ICD-10-CM

## 2016-09-28 DIAGNOSIS — I10 Essential (primary) hypertension: Secondary | ICD-10-CM

## 2016-09-28 LAB — COMPREHENSIVE METABOLIC PANEL
ALT: 19 U/L (ref 17–63)
AST: 22 U/L (ref 15–41)
Albumin: 3.5 g/dL (ref 3.5–5.0)
Alkaline Phosphatase: 55 U/L (ref 38–126)
Anion gap: 8 (ref 5–15)
BUN: 27 mg/dL — AB (ref 6–20)
CHLORIDE: 104 mmol/L (ref 101–111)
CO2: 27 mmol/L (ref 22–32)
Calcium: 8.6 mg/dL — ABNORMAL LOW (ref 8.9–10.3)
Creatinine, Ser: 1.59 mg/dL — ABNORMAL HIGH (ref 0.61–1.24)
GFR calc Af Amer: 41 mL/min — ABNORMAL LOW (ref >60)
GFR calc non Af Amer: 35 mL/min — ABNORMAL LOW (ref >60)
GLUCOSE: 104 mg/dL — AB (ref 65–99)
POTASSIUM: 3.9 mmol/L (ref 3.5–5.1)
Sodium: 139 mmol/L (ref 135–145)
Total Bilirubin: 1.2 mg/dL (ref 0.3–1.2)
Total Protein: 6.4 g/dL — ABNORMAL LOW (ref 6.5–8.1)

## 2016-09-28 LAB — CBC
HEMATOCRIT: 35.1 % — AB (ref 39.0–52.0)
HEMOGLOBIN: 11.6 g/dL — AB (ref 13.0–17.0)
MCH: 30.4 pg (ref 26.0–34.0)
MCHC: 33 g/dL (ref 30.0–36.0)
MCV: 92.1 fL (ref 78.0–100.0)
Platelets: 149 10*3/uL — ABNORMAL LOW (ref 150–400)
RBC: 3.81 MIL/uL — ABNORMAL LOW (ref 4.22–5.81)
RDW: 14.6 % (ref 11.5–15.5)
WBC: 3.5 10*3/uL — ABNORMAL LOW (ref 4.0–10.5)

## 2016-09-28 LAB — GLUCOSE, CAPILLARY: Glucose-Capillary: 102 mg/dL — ABNORMAL HIGH (ref 65–99)

## 2016-09-28 MED ORDER — FUROSEMIDE 10 MG/ML IJ SOLN
20.0000 mg | Freq: Every day | INTRAMUSCULAR | Status: DC
  Administered 2016-09-28: 20 mg via INTRAVENOUS
  Filled 2016-09-28: qty 2

## 2016-09-28 MED ORDER — ENOXAPARIN SODIUM 40 MG/0.4ML ~~LOC~~ SOLN: 40.0000 mg | SUBCUTANEOUS | Status: DC

## 2016-09-28 MED ORDER — APIXABAN 2.5 MG PO TABS: 2.5000 mg | ORAL_TABLET | Freq: Two times a day (BID) | ORAL | 0 refills | Status: DC

## 2016-09-28 MED ORDER — APIXABAN 2.5 MG PO TABS
2.5000 mg | ORAL_TABLET | Freq: Two times a day (BID) | ORAL | Status: DC
  Administered 2016-09-29: 2.5 mg via ORAL
  Filled 2016-09-28: qty 1

## 2016-09-28 NOTE — Care Management Note (Signed)
Case Management Note  Patient Details  Name: Ralph Mendoza MRN: [id number] Date of Birth: 08/24/1920  Subjective/Objective:                  83 y.o. male with medical history significant for right and left pleural effusion, hypertension, chronic systolic CHF (ECHO in 9735 with EF 25%) presented to ED with worsening shortness of breath over past days to a week or so prior to this admission associated with intermittent non productive cough. No fevers, no chills. Np chest pain or palpitations. No leg swelling. Shortness of breath is present at rest and with exertion. No abdominal pain, nausea or vomiting. NO GU complaints.   ED Course: Pt was hemodynamically stable in ED, BP was 111/74, normal oxygen saturation. Blood work showed hgb 11.4, creatinine 1.48, troponin 0.05. CXR and CT chest showed right pleural effusion and pt admitted for management of this pleural effusion. She will need thoracentesis during this hospital stay.   Action/Plan: Date:  September 28, 2016 Chart reviewed for concurrent status and case management needs. Will continue to follow patient progress. Discharge Planning: following for needs Expected discharge date: [id number] [name], BSN, [place], [place]  Expected Discharge Date:  09/30/16               Expected Discharge Plan:  Assisted Living / Rest Home  In-House Referral:  Clinical Social Work  Discharge planning Services  CM Consult  Post Acute Care Choice:    Choice offered to:     DME Arranged:    DME Agency:     HH Arranged:    [place] Agency:     Status of Service:  In process, will continue to follow  If discussed at Long Length of Stay Meetings, dates discussed:    Additional Comments:  Leeroy Cha, RN 09/28/2016, 10:14 AM

## 2016-09-28 NOTE — Progress Notes (Addendum)
PROGRESS NOTE    Ralph Mendoza  VZS:[id number] DOB: 19-May-1920 DOA: 09/27/2016 PCP: [name], MD  Brief Narrative: Ralph Mendoza is a 83 y.o. male with medical history significant for right and left pleural effusion, hypertension, chronic systolic CHF (ECHO in 4492 with EF 25%) presented to ED with worsening shortness of breath  For a week with non productive cough. He was found to have large right pleural effusion.    Assessment & Plan:   Principal Problem:   Recurrent right pleural effusion Active Problems:   Essential hypertension   Atrial fibrillation (HCC)   CKD (chronic kidney disease) stage 3, GFR 30-59 ml/min   Anemia of chronic disease   Recurrent right pleural effusion:  Unclear etiology, asymmetrical pleural fluid accumulation , without any fever, or leucocytosis. Pt does not appear to look toxic, unlikely infectious etiology. In addition to that pt reports that he had similar episode in feb and underwent outpatient thoracentesis done and went home.   today he Underwent thoracentesis, where  1.7 lit fluid clear fluid aspirated.  Unfortunately, the fluid was not sent for analysis.  Meanwhile he reports persistent sob and occasional dry cough.  ? Chf, CT chest does not show any abnormality other than pleural effusion on the right.  Repeat CXR does not show any pneumothorax.   Will get repeat ECHO for evaluation of wall motion and EF, as his last echo shows EF of 20%. Cardiac enzymes mildly elevated.  Changed to IV lasix .  Will call cardiology for consultation and recommendations.     Hypertension:  Well controlled.    Chronic atrial fib:  On eliquis , decreased the dose to 2.5 BID, for his renal function.   Stage 3 CKD:  His baseline between. 1.3 to 1.5.   Mild anemia of chronic disease:  Monitor prn.     DVT prophylaxis: eliquis   Code Status: full code.  Family Communication: none at bedside, discussed the plan of care with the patient,  offered to update family, pt stated that he will update family.  Disposition Plan: pending cardiology evaluation in am.    Consultants:   Cardiology.   Procedures: thoracocentesis and 1.7 liters fluid taken out on 6/11 Antimicrobials: none.   Subjective: Has some sob on talking.   Objective: Vitals:   09/28/16 1035 09/28/16 1055 09/28/16 1116 09/28/16 1419  BP: (!) 157/81 (!) 150/96 (!) 142/81 134/81  Pulse:    70  Resp:    18  Temp:    97.5 F (36.4 C)  TempSrc:    Oral  SpO2:    97%  Weight:      Height:        Intake/Output Summary (Last 24 hours) at 09/28/16 1549 Last data filed at 09/28/16 1300  Gross per 24 hour  Intake           1031.5 ml  Output              800 ml  Net            231.5 ml   Filed Weights   09/27/16 1546 09/28/16 0457  Weight: 83.5 kg (184 lb) 85.7 kg (188 lb 15 oz)    Examination:  General exam: Appears calm and comfortable  Respiratory system: diminished on the right, when compared tot he left.  Cardiovascular system: S1 & S2 heard, RRR. No JVD, murmurs, rubs, gallops or clicks.  Gastrointestinal system: Abdomen is nondistended, soft and nontender. No organomegaly or masses  felt. Normal bowel sounds heard. Central nervous system: Alert and oriented. No focal neurological deficits. Extremities: Symmetric 5 x 5 power. Ankle edema.  Skin: No rashes, lesions or ulcers Psychiatry: Judgement and insight appear normal. Mood & affect appropriate.     Data Reviewed: I have personally reviewed following labs and imaging studies  CBC:  Recent Labs Lab 09/27/16 1015 09/28/16 0715  WBC 4.0 3.5*  HGB 11.4* 11.6*  HCT 33.4* 35.1*  MCV 92.3 92.1  PLT 164 071*   Basic Metabolic Panel:  Recent Labs Lab 09/27/16 1015 09/28/16 0715  NA 137 139  K 3.6 3.9  CL 103 104  CO2 24 27  GLUCOSE 127* 104*  BUN 26* 27*  CREATININE 1.48* 1.59*  CALCIUM 8.7* 8.6*   GFR: Estimated Creatinine Clearance: 29.3 mL/min (A) (by C-G formula based  on SCr of 1.59 mg/dL (H)). Liver Function Tests:  Recent Labs Lab 09/27/16 1015 09/28/16 0715  AST 28 22  ALT 20 19  ALKPHOS 59 55  BILITOT 1.6* 1.2  PROT 6.8 6.4*  ALBUMIN 3.7 3.5   No results for input(s): LIPASE, AMYLASE in the last 168 hours. No results for input(s): AMMONIA in the last 168 hours. Coagulation Profile: No results for input(s): INR, PROTIME in the last 168 hours. Cardiac Enzymes:  Recent Labs Lab 09/27/16 1015 09/27/16 1349 09/27/16 1929  TROPONINI 0.05* 0.05* 0.05*   BNP (last 3 results) No results for input(s): PROBNP in the last 8760 hours. HbA1C: No results for input(s): HGBA1C in the last 72 hours. CBG:  Recent Labs Lab 09/28/16 0743  GLUCAP 102*   Lipid Profile: No results for input(s): CHOL, HDL, LDLCALC, TRIG, CHOLHDL, LDLDIRECT in the last 72 hours. Thyroid Function Tests: No results for input(s): TSH, T4TOTAL, FREET4, T3FREE, THYROIDAB in the last 72 hours. Anemia Panel: No results for input(s): VITAMINB12, FOLATE, FERRITIN, TIBC, IRON, RETICCTPCT in the last 72 hours. Sepsis Labs: No results for input(s): PROCALCITON, LATICACIDVEN in the last 168 hours.  Recent Results (from the past 240 hour(s))  MRSA PCR Screening     Status: None   Collection Time: 09/27/16  5:20 PM  Result Value Ref Range Status   MRSA by PCR NEGATIVE NEGATIVE Final    Comment:        The GeneXpert MRSA Assay (FDA approved for NASAL specimens only), is one component of a comprehensive MRSA colonization surveillance program. It is not intended to diagnose MRSA infection nor to guide or monitor treatment for MRSA infections.          Radiology Studies: Dg Chest 1 View  Result Date: 09/28/2016 CLINICAL DATA:  Status post right thoracentesis EXAM: CHEST 1 VIEW COMPARISON:  11/27/2016 FINDINGS: Moderate cardiac enlargement. Aortic atherosclerosis. There is been interval decrease in volume of the right pleural effusion status post thoracentesis. No  pneumothorax identified. IMPRESSION: 1. No complications after right-sided thoracentesis. Electronically Signed   By: Kerby Moors M.D.   On: 09/28/2016 11:43   Dg Chest 2 View  Result Date: 09/27/2016 CLINICAL DATA:  Congestion, shortness of breath x1 month EXAM: CHEST  2 VIEW COMPARISON:  05/07/2016 FINDINGS: Moderate right pleural effusion, increased. Underlying right lower lobe opacity, likely compressive atelectasis. Left lung is clear. No pneumothorax. The heart is normal in size. Degenerative changes of the thoracic spine. IMPRESSION: Moderate right pleural effusion, increased. Underlying right lower lobe opacity, likely compressive atelectasis. Electronically Signed   By: Julian Hy M.D.   On: 09/27/2016 10:51   Ct Chest W  Contrast  Result Date: 09/27/2016 CLINICAL DATA:  83 year old male with shortness of breath and nonproductive cough for 1 month. Right pleural effusion. EXAM: CT CHEST WITH CONTRAST TECHNIQUE: Multidetector CT imaging of the chest was performed during intravenous contrast administration. CONTRAST:  67mL ISOVUE-300 IOPAMIDOL (ISOVUE-300) INJECTION 61% COMPARISON:  Chest radiographs 01042 hours today and earlier. Chest CT 04/29/2016. FINDINGS: Cardiovascular: Stable cardiomegaly with generalize chamber enlargement. No pericardial effusion. Soft and calcified atherosclerosis of the aorta again noted. Calcified coronary artery atherosclerosis. Stable thoracic aorta and other visible major mediastinal vascular structures. 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 Mediastinum/Nodes: Negative.  No lymphadenopathy. Lungs/Pleura: Moderate to large layering right pleural effusion has simple fluid density and appears stable in volume since the January chest CT. A small component of  the effusion is sub pulmonic at the right lung base as before. No associated pleural thickening or nodularity. A small left pleural effusion has regressed and nearly resolved since January. Compressive atelectasis throughout the right lung, with subtotal atelectasis of the right lower lobe stable in appearance to that in January. Layering right middle lobe atelectasis along the lower major fissure is stable. Major airways remain patent. Mildly improved left lung ventilation compared to the prior CT. The anterior left upper lobe confluent opacity described on the January CT has completely resolved and was atelectasis or inflammatory. No new or suspicious pulmonary opacity. Upper Abdomen: Mild enlargement of the hepatic IVC and proximal ectatic veins is stable. Mild hepatic vein contrast reflux. Otherwise negative visible liver, spleen, pancreas, adrenal glands, kidneys, and bowel in the upper abdomen. Musculoskeletal: No acute osseous abnormality identified. Degenerative changes in the thoracic spine. IMPRESSION: 1. Continued moderate to large layering right pleural effusion since January with compressive right lung atelectasis, including subtotal atelectasis of the right lower lobe. 2. Regressed and nearly resolved smaller left pleural effusion since January. Completely resolved anterior left upper lobe opacity described on the January CT. No new pulmonary abnormality. 3. Stable moderate cardiomegaly. Calcified aortic and coronary artery atherosclerosis. Electronically Signed   By: Genevie Ann M.D.   On: 09/27/2016 12:20   US Thoracentesis Asp Pleural Space W/img Guide  Result Date: 09/28/2016 INDICATION: CHF, atrial fibrillation, chronic kidney disease, dyspnea, recurrent right pleural effusion. Request made for therapeutic right thoracentesis. EXAM: ULTRASOUND GUIDED THERAPEUTIC RIGHT THORACENTESIS MEDICATIONS: None. COMPLICATIONS: None immediate. PROCEDURE: An ultrasound guided thoracentesis was thoroughly  discussed with the patient and questions answered. The benefits, risks, alternatives and complications were also discussed. The patient understands and wishes to proceed with the procedure. Written consent was obtained. Ultrasound was performed to localize and mark an adequate pocket of fluid in the right chest. The area was then prepped and draped in the normal sterile fashion. 1% Lidocaine was used for local anesthesia. Under ultrasound guidance a Safe-T-Centesis catheter was introduced. Thoracentesis was performed. The catheter was removed and a dressing applied. FINDINGS: A total of approximately 1.7 liters of yellow fluid was removed. Only the above amount of fluid was removed at this time secondary to patient coughing. IMPRESSION: Successful ultrasound guided therapeutic right thoracentesis yielding 1.7 liters of pleural fluid. Read by: Rowe Robert, PA-C Electronically Signed   By: Corrie Mckusick D.O.   On: 09/28/2016 11:41        Scheduled Meds: . furosemide  20 mg Oral Daily  . isosorbide  mononitrate  30 mg Oral QHS   Continuous Infusions: . sodium chloride 10 mL/hr at 09/27/16 1715     LOS: 1 day    Time spent: 40 MIN, OF which 50% of time spent face to face discussing the care with the patient .     Hosie Poisson, MD [facility] Pager [phone]   If 7PM-7AM, please contact night-coverage [email or web address] Password [facility] 09/28/2016, 3:49 PM

## 2016-09-28 NOTE — Procedures (Signed)
Ultrasound-guided  therapeutic right thoracentesis performed yielding 1.7 liters of yellow fluid. No immediate complications. Follow-up chest x-ray pending.

## 2016-09-29 ENCOUNTER — Inpatient Hospital Stay (HOSPITAL_COMMUNITY): Payer: Medicare Other

## 2016-09-29 LAB — BASIC METABOLIC PANEL
ANION GAP: 10 (ref 5–15)
BUN: 28 mg/dL — ABNORMAL HIGH (ref 6–20)
CO2: 29 mmol/L (ref 22–32)
Calcium: 9 mg/dL (ref 8.9–10.3)
Chloride: 100 mmol/L — ABNORMAL LOW (ref 101–111)
Creatinine, Ser: 1.49 mg/dL — ABNORMAL HIGH (ref 0.61–1.24)
GFR, EST AFRICAN AMERICAN: 44 mL/min — AB (ref >60)
GFR, EST NON AFRICAN AMERICAN: 38 mL/min — AB (ref >60)
Glucose, Bld: 112 mg/dL — ABNORMAL HIGH (ref 65–99)
POTASSIUM: 3.8 mmol/L (ref 3.5–5.1)
Sodium: 139 mmol/L (ref 135–145)

## 2016-09-29 LAB — GLUCOSE, CAPILLARY: Glucose-Capillary: 116 mg/dL — ABNORMAL HIGH (ref 65–99)

## 2016-09-29 MED ORDER — FUROSEMIDE 10 MG/ML IJ SOLN: 40.0000 mg | Freq: Every day | INTRAMUSCULAR | Status: DC

## 2016-09-29 NOTE — Progress Notes (Signed)
PT Cancellation Note  Patient Details Name: Ralph Mendoza MRN: [id number] DOB: March 03, 1921   Cancelled Treatment:    Reason Eval/Treat Not Completed: Attempted PT eval. Pt refused participation with PT. Will check back another day to attempt PT eval if pt will participate. Thanks.    Weston Anna, MPT Pager: [phone]

## 2016-09-29 NOTE — Progress Notes (Signed)
This shift pt req daily weight to be done later in the a.m. Will forward request to dayshift staff.

## 2016-09-30 NOTE — Discharge Summary (Signed)
Physician Discharge Summary  Ralph Mendoza Ralph Mendoza:[id number] DOB: 09-07-1920 DOA: 09/27/2016  PCP: [name], MD  Admit date: 09/27/2016 Discharge date: 09/29/2016  Admitted From: [facility].  Disposition:  Home.   Recommendations for Outpatient Follow-up:  1. Follow up with PCP in 1-2 weeks 2. Please obtain BMP/CBC in one week 3. Please follow up with Dr Stanford Breed at the end of the month.  4. You will need an echocardiogram to evaluate for LVEF and wall motion.     Discharge Condition:stable.  CODE STATUS: full code.  Diet recommendation: Heart Healthy   Brief/Interim Summary: Ralph Mendoza a 82 y.o.malewith medical history significant for right and left pleural effusion, hypertension, chronic systolic CHF (ECHO in 2458 with EF 25%) presented to ED with worsening shortness of breath  For a week with non productive cough. He was found to have large right pleural effusion. He underwent a thoracentesis on the left side, unfortunately thefluid was not sent for analysis. Ordered echocardiogram but pt refused. Called cardiology consult, Dr turner called back and recommended pulmonolgy consult.   Discharge Diagnoses:  Principal Problem:   Recurrent right pleural effusion Active Problems:   Essential hypertension   Atrial fibrillation (HCC)   CKD (chronic kidney disease) stage 3, GFR 30-59 ml/min   Anemia of chronic disease  Recurrent right pleural effusion:  Unclear etiology, asymmetrical pleural fluid accumulation , without any fever, or leucocytosis. Pt does not appear to look toxic, unlikely infectious etiology. In addition to that pt reports that he had similar episode in feb and underwent outpatient thoracentesis done and went home.   today he Underwent thoracentesis, where  1.7 lit fluid clear fluid aspirated.  Unfortunately, the fluid was not sent for analysis.  Meanwhile he reports persistent sob and occasional dry cough.  ? Chf, CT chest does not show any abnormality other  than pleural effusion on the right.  Repeat CXR does not show any pneumothorax.   Will get repeat ECHO for evaluation of wall motion and EF, as his last echo shows EF of 20%. But pt refused echocardiogram.  Cardiac enzymes mildly elevated. But no chest pain.  Called cardiology consult, cardiology deferred to outpatient follow up and to pulmonology referral.  Discussed with pt , who did not want to see pulmonology in the hospital, instead would follow up with Dr Stanford Breed at the end of the month and take things from there.     Hypertension:  Well controlled.    Chronic atrial fib:  On eliquis , decreased the dose to 2.5 BID, for his renal function.   Stage 3 CKD:  His baseline between. 1.3 to 1.5.   Mild anemia of chronic disease:  Monitor prn.    Discharge Instructions  Discharge Instructions    Diet - low sodium heart healthy    Complete by:  As directed    Discharge instructions    Complete by:  As directed    Please follow up with Dr Stanford Breed on June 29th.  Please get BMP done at the appointment with Dr Stanford Breed.     Allergies as of 09/29/2016   No Known Allergies     Medication List    STOP taking these medications   ciprofloxacin 500 MG tablet Commonly known as:  CIPRO   HYDROcodone-acetaminophen 5-325 MG tablet Commonly known as:  NORCO/VICODIN   lisinopril 20 MG tablet Commonly known as:  PRINIVIL,ZESTRIL   metroNIDAZOLE 500 MG tablet Commonly known as:  FLAGYL   ondansetron 8 MG disintegrating tablet  Commonly known as:  ZOFRAN ODT     TAKE these medications   acetaminophen 500 MG tablet Commonly known as:  TYLENOL Take 1,000 mg by mouth every 6 (six) hours as needed for mild pain or moderate pain.   apixaban 2.5 MG Tabs tablet Commonly known as:  ELIQUIS Take 1 tablet (2.5 mg total) by mouth 2 (two) times daily. What changed:  medication strength  See the new instructions.  Another medication with the same name was removed.  Continue taking this medication, and follow the directions you see here.   DELSYM COUGH/CHEST CONGEST DM PO Take 10 mLs by mouth daily as needed (for cough).   furosemide 20 MG tablet Commonly known as:  LASIX Take 1 tablet (20 mg total) by mouth daily.   isosorbide mononitrate 30 MG 24 hr tablet Commonly known as:  IMDUR Take 1 tablet (30 mg total) by mouth daily. What changed:  when to take this      Follow-up Information    [name], MD Follow up.   Specialty:  Family Medicine Contact information: [address] [place] [address] [phone]          No Known Allergies  Consultations:  Called Dr Radford Pax for consult, deferred to pulmonology   Procedures/Studies: Dg Chest 1 View  Result Date: 09/28/2016 CLINICAL DATA:  Status post right thoracentesis EXAM: CHEST 1 VIEW COMPARISON:  11/27/2016 FINDINGS: Moderate cardiac enlargement. Aortic atherosclerosis. There is been interval decrease in volume of the right pleural effusion status post thoracentesis. No pneumothorax identified. IMPRESSION: 1. No complications after right-sided thoracentesis. Electronically Signed   By: Kerby Moors M.D.   On: 09/28/2016 11:43   Dg Chest 2 View  Result Date: 09/27/2016 CLINICAL DATA:  Congestion, shortness of breath x1 month EXAM: CHEST  2 VIEW COMPARISON:  05/07/2016 FINDINGS: Moderate right pleural effusion, increased. Underlying right lower lobe opacity, likely compressive atelectasis. Left lung is clear. No pneumothorax. The heart is normal in size. Degenerative changes of the thoracic spine. IMPRESSION: Moderate right pleural effusion, increased. Underlying right lower lobe opacity, likely compressive atelectasis. Electronically Signed   By: Julian Hy M.D.   On: 09/27/2016 10:51   Ct Chest W Contrast  Result Date: 09/27/2016 CLINICAL DATA:  83 year old male with shortness of breath and nonproductive cough for 1 month. Right pleural effusion.  EXAM: CT CHEST WITH CONTRAST TECHNIQUE: Multidetector CT imaging of the chest was performed during intravenous contrast administration. CONTRAST:  52mL ISOVUE-300 IOPAMIDOL (ISOVUE-300) INJECTION 61% COMPARISON:  Chest radiographs 01042 hours today and earlier. Chest CT 04/29/2016. FINDINGS: Cardiovascular: Stable cardiomegaly with generalize chamber enlargement. No pericardial effusion. Soft and calcified atherosclerosis of the aorta again noted. Calcified coronary artery atherosclerosis. Stable thoracic aorta and other visible major mediastinal vascular structures. 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 Mediastinum/Nodes: Negative.  No lymphadenopathy. Lungs/Pleura: Moderate to large layering right pleural effusion has simple fluid density and appears stable in volume since the January chest CT. A small component of the effusion is sub pulmonic at the right lung base as before. No associated pleural thickening or nodularity. A small left pleural effusion has regressed and nearly resolved since January. Compressive atelectasis throughout the right lung, with subtotal atelectasis of the right lower lobe stable in appearance to that in January. Layering right middle lobe atelectasis  along the lower major fissure is stable. Major airways remain patent. Mildly improved left lung ventilation compared to the prior CT. The anterior left upper lobe confluent opacity described on the January CT has completely resolved and was atelectasis or inflammatory. No new or suspicious pulmonary opacity. Upper Abdomen: Mild enlargement of the hepatic IVC and proximal ectatic veins is stable. Mild hepatic vein contrast reflux. Otherwise negative visible liver, spleen, pancreas, adrenal glands, kidneys, and bowel in the upper  abdomen. Musculoskeletal: No acute osseous abnormality identified. Degenerative changes in the thoracic spine. IMPRESSION: 1. Continued moderate to large layering right pleural effusion since January with compressive right lung atelectasis, including subtotal atelectasis of the right lower lobe. 2. Regressed and nearly resolved smaller left pleural effusion since January. Completely resolved anterior left upper lobe opacity described on the January CT. No new pulmonary abnormality. 3. Stable moderate cardiomegaly. Calcified aortic and coronary artery atherosclerosis. Electronically Signed   By: Genevie Ann M.D.   On: 09/27/2016 12:20   US Thoracentesis Asp Pleural Space W/img Guide  Result Date: 09/28/2016 INDICATION: CHF, atrial fibrillation, chronic kidney disease, dyspnea, recurrent right pleural effusion. Request made for therapeutic right thoracentesis. EXAM: ULTRASOUND GUIDED THERAPEUTIC RIGHT THORACENTESIS MEDICATIONS: None. COMPLICATIONS: None immediate. PROCEDURE: An ultrasound guided thoracentesis was thoroughly discussed with the patient and questions answered. The benefits, risks, alternatives and complications were also discussed. The patient understands and wishes to proceed with the procedure. Written consent was obtained. Ultrasound was performed to localize and mark an adequate pocket of fluid in the right chest. The area was then prepped and draped in the normal sterile fashion. 1% Lidocaine was used for local anesthesia. Under ultrasound guidance a Safe-T-Centesis catheter was introduced. Thoracentesis was performed. The catheter was removed and a dressing applied. FINDINGS: A total of approximately 1.7 liters of yellow fluid was removed. Only the above amount of fluid was removed at this time secondary to patient coughing. IMPRESSION: Successful ultrasound guided therapeutic right thoracentesis yielding 1.7 liters of pleural fluid. Read by: Rowe Robert, PA-C Electronically Signed   By: Corrie Mckusick D.O.   On: 09/28/2016 11:41       Subjective: No chest pain, or sob, nausea, or vomiting or cough  Discharge Exam: Vitals:   09/28/16 2000 09/29/16 0544  BP: 128/72 115/61  Pulse: 70 (!) 58  Resp: 18 18  Temp: 98.2 F (36.8 C) 97.8 F (36.6 C)   Vitals:   09/28/16 1116 09/28/16 1419 09/28/16 2000 09/29/16 0544  BP: (!) 142/81 134/81 128/72 115/61  Pulse:  70 70 (!) 58  Resp:  18 18 18   Temp:  97.5 F (36.4 C) 98.2 F (36.8 C) 97.8 F (36.6 C)  TempSrc:  Oral Oral Oral  SpO2:  97% 97% 96%  Weight:      Height:        General: Pt is alert, awake, not in acute distress Cardiovascular: RRR, S1/S2 +, no rubs, no gallops Respiratory: CTA bilaterally, no wheezing, no rhonchi Abdominal: Soft, NT, ND, bowel sounds + Extremities: no edema, no cyanosis    The results of significant diagnostics from this hospitalization (including imaging, microbiology, ancillary and laboratory) are listed below for reference.     Microbiology: Recent Results (from the past 240 hour(s))  MRSA PCR Screening     Status: None   Collection Time: 09/27/16  5:20 PM  Result Value Ref Range Status   MRSA by PCR NEGATIVE NEGATIVE Final    Comment:  The GeneXpert MRSA Assay (FDA approved for NASAL specimens only), is one component of a comprehensive MRSA colonization surveillance program. It is not intended to diagnose MRSA infection nor to guide or monitor treatment for MRSA infections.      Labs: BNP (last 3 results)  Recent Labs  01/31/16 1341 04/29/16 1144 09/27/16 1015  BNP 342.7* 318.8* 517.6*   Basic Metabolic Panel:  Recent Labs Lab 09/27/16 1015 09/28/16 0715 09/29/16 0957  NA 137 139 139  K 3.6 3.9 3.8  CL 103 104 100*  CO2 24 27 29   GLUCOSE 127* 104* 112*  BUN 26* 27* 28*  CREATININE 1.48* 1.59* 1.49*  CALCIUM 8.7* 8.6* 9.0   Liver Function Tests:  Recent Labs Lab 09/27/16 1015 09/28/16 0715  AST 28 22  ALT 20 19  ALKPHOS 59 55   BILITOT 1.6* 1.2  PROT 6.8 6.4*  ALBUMIN 3.7 3.5   No results for input(s): LIPASE, AMYLASE in the last 168 hours. No results for input(s): AMMONIA in the last 168 hours. CBC:  Recent Labs Lab 09/27/16 1015 09/28/16 0715  WBC 4.0 3.5*  HGB 11.4* 11.6*  HCT 33.4* 35.1*  MCV 92.3 92.1  PLT 164 149*   Cardiac Enzymes:  Recent Labs Lab 09/27/16 1015 09/27/16 1349 09/27/16 1929  TROPONINI 0.05* 0.05* 0.05*   BNP: Invalid input(s): POCBNP CBG:  Recent Labs Lab 09/28/16 0743 09/29/16 0728  GLUCAP 102* 116*   D-Dimer No results for input(s): DDIMER in the last 72 hours. Hgb A1c No results for input(s): HGBA1C in the last 72 hours. Lipid Profile No results for input(s): CHOL, HDL, LDLCALC, TRIG, CHOLHDL, LDLDIRECT in the last 72 hours. Thyroid function studies No results for input(s): TSH, T4TOTAL, T3FREE, THYROIDAB in the last 72 hours.  Invalid input(s): FREET3 Anemia work up No results for input(s): VITAMINB12, FOLATE, FERRITIN, TIBC, IRON, RETICCTPCT in the last 72 hours. Urinalysis    Component Value Date/Time   COLORURINE YELLOW 04/29/2016 [place] 04/29/2016 1333   LABSPEC 1.014 04/29/2016 1333   PHURINE 5.0 04/29/2016 1333   GLUCOSEU NEGATIVE 04/29/2016 1333   HGBUR NEGATIVE 04/29/2016 1333   BILIRUBINUR NEGATIVE 04/29/2016 1333   KETONESUR NEGATIVE 04/29/2016 1333   PROTEINUR NEGATIVE 04/29/2016 1333   NITRITE NEGATIVE 04/29/2016 1333   LEUKOCYTESUR NEGATIVE 04/29/2016 1333   Sepsis Labs Invalid input(s): PROCALCITONIN,  WBC,  LACTICIDVEN Microbiology Recent Results (from the past 240 hour(s))  MRSA PCR Screening     Status: None   Collection Time: 09/27/16  5:20 PM  Result Value Ref Range Status   MRSA by PCR NEGATIVE NEGATIVE Final    Comment:        The GeneXpert MRSA Assay (FDA approved for NASAL specimens only), is one component of a comprehensive MRSA colonization surveillance program. It is not intended to  diagnose MRSA infection nor to guide or monitor treatment for MRSA infections.      Time coordinating discharge: Over 30 minutes  SIGNED:   Hosie Poisson, MD  [facility] 09/30/2016, 10:51 AM Pager   If 7PM-7AM, please contact night-coverage [email or web address] Password [email or web address]

## 2016-10-12 NOTE — Progress Notes (Signed)
HPI: FU atrial fibrillation. We have elected rate control and anticoagulation for afib. Holter monitor in February of 2014 showed sinus rhythm, mobitz 1; toprol DCed. Echocardiogram August2017 showed ejection fraction 25-30%, moderate mitral regurgitation, biatrial enlargement and mild right ventricular enlargement. Pulmonary pressure moderately increased. Nuclear study September 2017 showed ejection fraction 28%. No ischemia. Fixed apical defect. Patient had CT scan January 2018 that showed large right and small left pleural effusion. Probable area of atelectasis in anterior right upper lobe but follow-up recommended in 2-3 months. BNP 318. Patient had thoracentesis January 18.Cytology negative. Patient had repeat thoracentesis June 2018. Fluid not sent for analysis. Since last seen, he has some dyspnea but no chest pain. No orthopnea, PND or pedal edema. No syncope.  Current Outpatient Prescriptions  Medication Sig Dispense Refill  . acetaminophen (TYLENOL) 500 MG tablet Take 1,000 mg by mouth every 6 (six) hours as needed for mild pain or moderate pain.    [name] apixaban (ELIQUIS) 2.5 MG TABS tablet Take 1 tablet (2.5 mg total) by mouth 2 (two) times daily. 60 tablet 0  . Dextromethorphan-Guaifenesin (DELSYM COUGH/CHEST CONGEST DM PO) Take 10 mLs by mouth daily as needed (for cough).     . furosemide (LASIX) 20 MG tablet Take 1 tablet (20 mg total) by mouth daily. 90 tablet 3  . isosorbide mononitrate (IMDUR) 30 MG 24 hr tablet Take 1 tablet (30 mg total) by mouth daily. (Patient taking differently: Take 30 mg by mouth at bedtime. ) 90 tablet 3   No current facility-administered medications for this visit.      Past Medical History:  Diagnosis Date  . Atrial fibrillation ([place])   . BPH (benign prostatic hyperplasia)   . Dyspnea on exertion    a. 09/2008 NL EF on myoview.  [name] Hearing difficulty    R sided hole in eardrum, only wears L hearing aid  . History of tobacco abuse   .  Hypertension   . Lumbar stenosis with neurogenic claudication    a. 02/2011 s/p decompressive laminectomy.  . Midsternal chest pain    a. 09/2008 Myoview: EF 56%, mild fixed thinning of inf wall particularly @ base -? attenuation.  No ischemia  . PUD (peptic ulcer disease)    Remote, history of  . Urinary hesitancy     Past Surgical History:  Procedure Laterality Date  . HERNIA REPAIR     Left  . LUMBAR LAMINECTOMY/DECOMPRESSION MICRODISCECTOMY  03/04/2011   Procedure: LUMBAR LAMINECTOMY/DECOMPRESSION MICRODISCECTOMY;  Surgeon: Ophelia Charter;  Location: [facility];  Service: Neurosurgery;  Laterality: N/A;  LUMBAR THREE-FOUR ,FOUR-FIVE LAMINECTOMIES  . TYMPANOMASTOIDECTOMY     Left modified radical with type 3 tympanoplasty    Social History   Social History  . Marital status: Widowed    Spouse name: N/A  . Number of children: 2  . Years of education: N/A   Occupational History  . [employer]     Retired   Social History Main Topics  . Smoking status: Former Research scientist (life sciences)  . Smokeless tobacco: Never Used     Comment: Remote history of smoking  . Alcohol use No  . Drug use: No  . Sexual activity: Not on file   Other Topics Concern  . Not on file   Social History Narrative   Lives with his wife in [place].   He swims daily.   He has 2 grown daughters.   1 Caffeine drink daily     Family  History  Problem Relation Age of Onset  . Lung cancer Mother   . Appendicitis Father   . Diabetes Sister        DM  . Coronary artery disease Neg Hx        No premature CAD  . Colon cancer Neg Hx     ROS: no fevers or chills, productive cough, hemoptysis, dysphasia, odynophagia, melena, hematochezia, dysuria, hematuria, rash, seizure activity, orthopnea, PND, pedal edema, claudication. Remaining systems are negative.  Physical Exam: Well-developed well-nourished in no acute distress.  Skin is warm and dry.  HEENT is normal.  Neck is supple.  Chest Mildly  diminished breath sounds right lower lobe Cardiovascular exam is irregular Abdominal exam nontender or distended. No masses palpated. Extremities show no edema. neuro grossly intact   A/P  1 Atrial fibrillation-rate is controlled on no medications. Continue apixaban.   2 Chronic systolic congestive heart failure-patient appears to be euvolemic on examination. However he is having recurrent right pleural effusions that is likely secondary to heart failure. Increase Lasix to 40 mg daily. He was instructed on fluid restriction. Check potassium and renal function in 1 week.  3 Cardiomyopathy-previous nuclear study did not suggest coronary disease. Patient is 83 years old and we are treating medically. He is not on a beta blocker because of bradycardia. Add Cozaar 25 mg daily for LV dysfunction. Check potassium and renal function in 1 week.   4 hypertension-blood pressure is controlled. Continue present medications.  5 hyperlipidemia-management per primary care.  6 Right pleural effusion-possibly diastolic dysfunction. Increase Lasix as described above. He will return in 6 weeks to see our extender to make sure he is stable. Repeat chest x-ray at that time.  Kirk Ruths, MD

## 2016-10-15 ENCOUNTER — Encounter: Payer: Self-pay | Admitting: Cardiology

## 2016-10-15 ENCOUNTER — Ambulatory Visit (INDEPENDENT_AMBULATORY_CARE_PROVIDER_SITE_OTHER): Payer: Medicare Other | Admitting: Cardiology

## 2016-10-15 VITALS — BP 132/80 | HR 60 | Ht 70.0 in | Wt 178.8 lb

## 2016-10-15 DIAGNOSIS — J90 Pleural effusion, not elsewhere classified: Secondary | ICD-10-CM

## 2016-10-15 DIAGNOSIS — I4891 Unspecified atrial fibrillation: Secondary | ICD-10-CM

## 2016-10-15 DIAGNOSIS — I5022 Chronic systolic (congestive) heart failure: Secondary | ICD-10-CM | POA: Diagnosis not present

## 2016-10-15 MED ORDER — LOSARTAN POTASSIUM 25 MG PO TABS: 25.0000 mg | ORAL_TABLET | Freq: Every day | ORAL | 3 refills | Status: DC

## 2016-10-15 MED ORDER — FUROSEMIDE 20 MG PO TABS
40.0000 mg | ORAL_TABLET | Freq: Every day | ORAL | 3 refills | Status: DC
Start: 2016-10-15 — End: 2016-11-26

## 2016-10-15 NOTE — Patient Instructions (Signed)
Medication Instructions:   INCREASE FUROSEMIDE TO 40 MG ONCE DAILY= 2 OF THE 20 MG TABLETS ONCE DAILY  START LOSARTAN 25 MG ONCE DAILY  Labwork:  Your physician recommends that you return for lab work in: ONE WEEK  Testing/Procedures:  A chest x-ray takes a picture of the organs and structures inside the chest, including the heart, lungs, and blood vessels. This test can show several things, including, whether the heart is enlarges; whether fluid is building up in the lungs; and whether pacemaker / defibrillator leads are still in place. AT [place] IN 6 WEEKS  Follow-Up:  Your physician recommends that you schedule a follow-up appointment in: [place] physician recommends that you schedule a follow-up appointment in: [place]

## 2016-10-23 LAB — BASIC METABOLIC PANEL
BUN / CREAT RATIO: 23 (ref 10–24)
BUN: 33 mg/dL (ref 10–36)
CHLORIDE: 98 mmol/L (ref 96–106)
CO2: 25 mmol/L (ref 20–29)
Calcium: 9.6 mg/dL (ref 8.6–10.2)
Creatinine, Ser: 1.42 mg/dL — ABNORMAL HIGH (ref 0.76–1.27)
GFR calc Af Amer: 49 mL/min/{1.73_m2} — ABNORMAL LOW (ref >59)
GFR calc non Af Amer: 42 mL/min/{1.73_m2} — ABNORMAL LOW (ref >59)
GLUCOSE: 103 mg/dL — AB (ref 65–99)
POTASSIUM: 4.7 mmol/L (ref 3.5–5.2)
SODIUM: 140 mmol/L (ref 134–144)

## 2016-11-02 ENCOUNTER — Other Ambulatory Visit: Payer: Self-pay | Admitting: *Deleted

## 2016-11-02 MED ORDER — APIXABAN 2.5 MG PO TABS: 2.5000 mg | ORAL_TABLET | Freq: Two times a day (BID) | ORAL | 1 refills | Status: DC

## 2016-11-16 ENCOUNTER — Encounter: Payer: Self-pay | Admitting: *Deleted

## 2016-11-16 ENCOUNTER — Telehealth: Payer: Self-pay | Admitting: Cardiology

## 2016-11-16 MED ORDER — APIXABAN 2.5 MG PO TABS: 2.5000 mg | ORAL_TABLET | Freq: Two times a day (BID) | ORAL | 1 refills | Status: DC

## 2016-11-16 NOTE — Telephone Encounter (Signed)
New Message  Per pt states the pharmacy is sending out the wrong mg for his eliquis. Pt would like to speak with RN to get information corrected. Please call back to discuss

## 2016-11-16 NOTE — Telephone Encounter (Signed)
Returned call to patient who states he got a letter from [facility] that they will ship out Eliquis 5mg  sometime in August. He is on Eliquis 2.5mg  BID. He requested that I contact his mail order pharmacy to verify patient's correct Rx  Patient's dose had been decreased of a June 2018 per patient but [facility] did not receive updated Rx - was sent to local pharmacy. Rx(s) sent to pharmacy electronically for 2.5mg  BID to mail order pharmacy and they cancelled old Rx for 5mg  BID.

## 2016-11-24 ENCOUNTER — Ambulatory Visit (HOSPITAL_COMMUNITY)
Admission: RE | Admit: 2016-11-24 | Discharge: 2016-11-24 | Disposition: A | Discharge status: Home / Self Care | Payer: Medicare Other | Source: Ambulatory Visit | Attending: Cardiology | Admitting: Cardiology

## 2016-11-24 DIAGNOSIS — J90 Pleural effusion, not elsewhere classified: Secondary | ICD-10-CM | POA: Diagnosis present

## 2016-11-26 ENCOUNTER — Ambulatory Visit (INDEPENDENT_AMBULATORY_CARE_PROVIDER_SITE_OTHER): Payer: Medicare Other | Admitting: Physician Assistant

## 2016-11-26 ENCOUNTER — Encounter: Payer: Self-pay | Admitting: Physician Assistant

## 2016-11-26 VITALS — BP 172/71 | HR 68 | Ht 70.5 in | Wt 182.2 lb

## 2016-11-26 DIAGNOSIS — I482 Chronic atrial fibrillation, unspecified: Secondary | ICD-10-CM

## 2016-11-26 DIAGNOSIS — I10 Essential (primary) hypertension: Secondary | ICD-10-CM | POA: Diagnosis not present

## 2016-11-26 DIAGNOSIS — J90 Pleural effusion, not elsewhere classified: Secondary | ICD-10-CM | POA: Diagnosis not present

## 2016-11-26 DIAGNOSIS — N183 Chronic kidney disease, stage 3 unspecified: Secondary | ICD-10-CM

## 2016-11-26 NOTE — Progress Notes (Signed)
Cardiology Office Note    Date:  11/27/2016   ID:  Ralph Mendoza, DOB May 15, 1920, MRN [id number]  PCP:  [name], MD  Cardiologist:  Dr. Stanford Breed  Chief Complaint  Patient presents with  . Follow-up    seen for Dr. Stanford Breed    History of Present Illness:  Ralph Mendoza is a 83 y.o. male with PMH of chronic atrial fibrillation on rate control, HTN, and h/o PUD. Heart monitor in February 2014 showed sinus rhythm, mobitz 1 block. Toprol was discontinued. Echocardiogram in August 2017 showed EF 25-30%, moderate mitral regurgitation, biatrial enlargement, mild right ventricular enlargement. Pulmonary pressure moderately increased. Ejection fraction has decreased 1 compared to the previous study which showed EF 40-45%. Nuclear study obtained in September 2017 showed EF 28%, no ischemia fixed apical defect. It was decided to treat his nonischemic cardiomyopathy with medical therapy given his advanced age. CT scanning in January 2018 showed large right and a small left pleural effusion, he eventually had thoracentesis on 05/07/2016. He had repeat thoracentesis in June 2018.  Patient presents today for cardiology office visit. He continued to have mild diminished breath sounds in the right base. This is consistent with recent chest x-ray suggesting a small right basilar pleural effusion. His blood pressure is quite elevated in the office today, he says this is because he just had an argument with his son who is also in the room. He does have a small area of break in the skin in the leg, apparently he crawled under the house recently. I rechecked his blood pressure in the room, his blood pressure did improve afterward down to 140s. He says his normal blood pressure is usually in the 120 to 130s. I will hold off on adjusting his blood pressure medication based on this single number. Otherwise, he denies any chest pain, he denies any lower extremity edema, orthopnea or PND. He is doing quite well for  a 83 year old. He may always have some degree of small pleural effusion, however he does not have any significant shortness of breath or dizziness. We will continue on the current dose of diuretic, renal function and electrolytes stable week after initiating this dose.   Past Medical History:  Diagnosis Date  . Atrial fibrillation ([place])   . BPH (benign prostatic hyperplasia)   . Dyspnea on exertion    a. 09/2008 NL EF on myoview.  [name] Hearing difficulty    R sided hole in eardrum, only wears L hearing aid  . History of tobacco abuse   . Hypertension   . Lumbar stenosis with neurogenic claudication    a. 02/2011 s/p decompressive laminectomy.  . Midsternal chest pain    a. 09/2008 Myoview: EF 56%, mild fixed thinning of inf wall particularly @ base -? attenuation.  No ischemia  . PUD (peptic ulcer disease)    Remote, history of  . Urinary hesitancy     Past Surgical History:  Procedure Laterality Date  . HERNIA REPAIR     Left  . LUMBAR LAMINECTOMY/DECOMPRESSION MICRODISCECTOMY  03/04/2011   Procedure: LUMBAR LAMINECTOMY/DECOMPRESSION MICRODISCECTOMY;  Surgeon: Ophelia Charter;  Location: [facility];  Service: Neurosurgery;  Laterality: N/A;  LUMBAR THREE-FOUR ,FOUR-FIVE LAMINECTOMIES  . TYMPANOMASTOIDECTOMY     Left modified radical with type 3 tympanoplasty    Current Medications: Outpatient Medications Prior to Visit  Medication Sig Dispense Refill  . acetaminophen (TYLENOL) 500 MG tablet Take 1,000 mg by mouth every 6 (six) hours as needed for mild  pain or moderate pain.    [name] apixaban (ELIQUIS) 2.5 MG TABS tablet Take 1 tablet (2.5 mg total) by mouth 2 (two) times daily. 180 tablet 1  . Dextromethorphan-Guaifenesin (DELSYM COUGH/CHEST CONGEST DM PO) Take 10 mLs by mouth daily as needed (for cough).     . losartan (COZAAR) 25 MG tablet Take 1 tablet (25 mg total) by mouth daily. 90 tablet 3  . furosemide (LASIX) 20 MG tablet Take 2 tablets (40 mg total) by mouth daily. 180  tablet 3  . isosorbide mononitrate (IMDUR) 30 MG 24 hr tablet Take 1 tablet (30 mg total) by mouth daily. (Patient taking differently: Take 30 mg by mouth at bedtime. ) 90 tablet 3   No facility-administered medications prior to visit.      Allergies:   Patient has no known allergies.   Social History   Social History  . Marital status: Widowed    Spouse name: N/A  . Number of children: 2  . Years of education: N/A   Occupational History  . [employer]     Retired   Social History Main Topics  . Smoking status: Former Research scientist (life sciences)  . Smokeless tobacco: Never Used     Comment: Remote history of smoking  . Alcohol use No  . Drug use: No  . Sexual activity: Not Asked   Other Topics Concern  . None   Social History Narrative   Lives with his wife in [place].   He swims daily.   He has 2 grown daughters.   1 Caffeine drink daily      Family History:  The patient's family history includes Appendicitis in his father; Diabetes in his sister; Lung cancer in his mother.   ROS:   Please see the history of present illness.    ROS All other systems reviewed and are negative.   PHYSICAL EXAM:   VS:  BP (!) 172/71   Pulse 68   Ht 5' 10.5" (1.791 m)   Wt 182 lb 3.2 oz (82.6 kg)   SpO2 98%   BMI 25.77 kg/m    GEN: Well nourished, well developed, in no acute distress  HEENT: normal  Neck: no JVD, carotid bruits, or masses Cardiac: RRR; no murmurs, rubs, or gallops,no edema  Respiratory:  clear to auscultation bilaterally, normal work of breathing GI: soft, nontender, nondistended, + BS MS: no deformity or atrophy  Skin: warm and dry, no rash Neuro:  Alert and Oriented x 3, Strength and sensation are intact Psych: euthymic mood, full affect  Wt Readings from Last 3 Encounters:  11/26/16 182 lb 3.2 oz (82.6 kg)  10/15/16 178 lb 12.8 oz (81.1 kg)  09/28/16 188 lb 15 oz (85.7 kg)      Studies/Labs Reviewed:   EKG:  EKG is not ordered today.    Recent  Labs: 09/27/2016: B Natriuretic Peptide 438.2 09/28/2016: ALT 19; Hemoglobin 11.6; Platelets 149 10/23/2016: BUN 33; Creatinine, Ser 1.42; Potassium 4.7; Sodium 140   Lipid Panel No results found for: CHOL, TRIG, HDL, CHOLHDL, VLDL, LDLCALC, LDLDIRECT  Additional studies/ records that were reviewed today include:   Echo 09/29/2012 LV EF: 40% -  45%  Study Conclusions  - Left ventricle: The cavity size was normal. Wall thickness was increased in a pattern of mild LVH. Systolic function was mildly to moderately reduced. The estimated ejection fraction was in the range of 40% to 45%. Diffuse hypokinesis, worse in the posterior wall. Doppler parameters are consistent with abnormal left  ventricular relaxation (grade 1 diastolic dysfunction). - Aortic valve: There was no stenosis. Mild regurgitation. - Mitral valve: Mild regurgitation. - Left atrium: The atrium was mildly dilated. - Right ventricle: The cavity size was normal. Systolic function was normal. - Tricuspid valve: Peak RV-RA gradient: 90mm Hg (S). - Pulmonary arteries: PA peak pressure: 18mm Hg (S). - Inferior vena cava: The vessel was normal in size; the respirophasic diameter changes were in the normal range (= 50%); findings are consistent with normal central venous pressure. - Pericardium, extracardiac: There was a pleural effusion. Impressions:  - Normal LV size with mild LV hypertrophy. EF 40-45% with diffuse hypokinesis, worse in the posterior wall. Normal RV size and systolic function. Mild AI and mild MR.     Echo 12/13/2015 LV EF: 25% -   30%  Study Conclusions  - Left ventricle: The cavity size was normal. Systolic function was   severely reduced. The estimated ejection fraction was in the   range of 25% to 30%. Diffuse hypokinesis. There was no evidence   of elevated ventricular filling pressure by Doppler parameters. - Aortic valve: Trileaflet; mildly thickened, mildly  calcified   leaflets. There was trivial regurgitation. - Mitral valve: There was moderate regurgitation. - Left atrium: The atrium was moderately dilated. - Right ventricle: The cavity size was mildly dilated. Wall   thickness was normal. - Right atrium: The atrium was moderately dilated. - Pulmonary arteries: Systolic pressure was moderately increased.   PA peak pressure: 56 mm Hg (S).  Impressions:  - EF is decreased when compared to prior study (40-45%).    ASSESSMENT:    1. Recurrent right pleural effusion   2. Chronic atrial fibrillation (HCC)   3. Essential hypertension   4. CKD (chronic kidney disease) stage 3, GFR 30-59 ml/min      PLAN:  In order of problems listed above:  1. Recurrent right pleural effusion: Confirmed on recent chest x-ray and also by today's physical exam. Size of pleural effusion has significantly decreased.  He is tolerating the increased dose of Lasix without any sign of dehydration at this point.  2. Chronic atrial fibrillation: on 2.5 mg twice a day of eliquis. Very well rate controlled despite not on any AV nodal blocking agent. He did ask today why he is not on pacemaker, I explained to him that he does not have any significant slow heart rate.  3. Hypertension: Blood pressure is elevated today, losartan 25 mg daily was added during the last office visit. His home blood pressure ranges in the 120 to 130s. He had argument with his son and a feels quite agitated prior to today's visit.   4. CKD stage III: Renal function stable after initiated losartan.     Medication Adjustments/Labs and Tests Ordered: Current medicines are reviewed at length with the patient today.  Concerns regarding medicines are outlined above.  Medication changes, Labs and Tests ordered today are listed in the Patient Instructions below. Patient Instructions  Medication Instructions:   No changes  Labwork:   none  Testing/Procedures:  none  Follow-Up:  4  months with Dr. Stanford Breed  If you need a refill on your cardiac medications before your next appointment, please call your pharmacy.      Hilbert Corrigan, [place]  11/27/2016 5:26 PM    [facility] [address], [place], [place]  [address] Phone: [phone]; Fax: [phone]

## 2016-11-26 NOTE — Patient Instructions (Signed)
Medication Instructions:   No changes  Labwork:   none  Testing/Procedures:  none  Follow-Up:  4 months with Dr. Stanford Breed  If you need a refill on your cardiac medications before your next appointment, please call your pharmacy.

## 2016-11-27 ENCOUNTER — Encounter: Payer: Self-pay | Admitting: Physician Assistant

## 2016-12-18 ENCOUNTER — Other Ambulatory Visit: Payer: Self-pay | Admitting: *Deleted

## 2016-12-18 MED ORDER — FUROSEMIDE 20 MG PO TABS: 20.0000 mg | ORAL_TABLET | Freq: Two times a day (BID) | ORAL | 1 refills | Status: DC

## 2016-12-29 ENCOUNTER — Telehealth: Payer: Self-pay | Admitting: Cardiology

## 2016-12-29 NOTE — Telephone Encounter (Signed)
Check with the patient, I do not see such medication in the medication list, question would be does he take it.

## 2016-12-29 NOTE — Telephone Encounter (Signed)
[facility] calling for refill of K-DUR 20 meq CR 90 day supply-[phone]  Ref [id number]

## 2016-12-30 ENCOUNTER — Telehealth: Payer: Self-pay | Admitting: Cardiology

## 2016-12-30 MED ORDER — LOSARTAN POTASSIUM 25 MG PO TABS: 25.0000 mg | ORAL_TABLET | Freq: Every day | ORAL | 3 refills | Status: DC

## 2016-12-30 NOTE — Telephone Encounter (Signed)
S/w daughter, cathy she states that pt does take k+. And will call pt and discuss and call back  Upon further review of chart at appt in march BUN and Creatinine were high so Dr Stanford Breed d/c'd potassium. Looks like pt did not stop. I will call back pt later or await call back and let him know.

## 2016-12-30 NOTE — Telephone Encounter (Signed)
F/u Message  Pt returning Rn call .please call back to discuss

## 2016-12-30 NOTE — Telephone Encounter (Signed)
Mr.Berkey is returning your call .[name]

## 2016-12-30 NOTE — Telephone Encounter (Signed)
S/w pt he needs refill of losartan not potassium(KDUR), he stopped K+ in march 2018.  [facility], they will remove from his profile. Send in new rx if needed and they will fill at that time

## 2016-12-30 NOTE — Telephone Encounter (Signed)
Pt states that [facility] did not receive losartan rx  E-Prescribing Status: Transmission to pharmacy failed (12/30/2016 12:13 PM EDT)  Called into pharmacy verbally

## 2017-01-14 ENCOUNTER — Ambulatory Visit: Payer: Medicare Other | Admitting: Cardiology

## 2017-02-23 ENCOUNTER — Other Ambulatory Visit: Payer: Self-pay | Admitting: *Deleted

## 2017-02-23 MED ORDER — ISOSORBIDE DINITRATE 30 MG PO TABS: 30.0000 mg | ORAL_TABLET | Freq: Every day | ORAL | 3 refills | Status: DC

## 2017-02-24 ENCOUNTER — Other Ambulatory Visit: Payer: Self-pay | Admitting: *Deleted

## 2017-02-24 MED ORDER — FUROSEMIDE 20 MG PO TABS: 20.0000 mg | ORAL_TABLET | Freq: Two times a day (BID) | ORAL | 2 refills | Status: DC

## 2017-03-15 ENCOUNTER — Telehealth: Payer: Self-pay | Admitting: Cardiology

## 2017-03-15 NOTE — Telephone Encounter (Signed)
Tried to return call to pt, left detailed message to call pharmacy for medication clarification

## 2017-03-15 NOTE — Telephone Encounter (Signed)
Left message for pt to call.

## 2017-03-15 NOTE — Telephone Encounter (Signed)
Returning Michelle's call,she said to send to triage.

## 2017-03-15 NOTE — Telephone Encounter (Signed)
New message   Patient calling to clarify  medication name change. Please call  Pt c/o medication issue:  1. Name of Medication: isosorbide dinitrate (ISORDIL) 30 MG tablet  2. How are you currently taking this medication (dosage and times per day)? As prescribed  3. Are you having a reaction (difficulty breathing--STAT)? NO  4. What is your medication issue? Patient states the name has slightly changed on medication and the color of the pill has changed. Wants to clarify they are the same pill.

## 2017-03-15 NOTE — Telephone Encounter (Signed)
F/u Message ° °Pt returning RN call. Please call back to discuss  °

## 2017-03-16 MED ORDER — LOSARTAN POTASSIUM 25 MG PO TABS: 25.0000 mg | ORAL_TABLET | Freq: Every day | ORAL | 3 refills | Status: DC

## 2017-03-16 NOTE — Progress Notes (Deleted)
HPI: FU atrial fibrillation. We have elected rate control and anticoagulation for afib. Holter monitor in February of 2014 showed sinus rhythm, mobitz 1; toprol DCed. Echocardiogram August2017 showed ejection fraction 25-30%, moderate mitral regurgitation, biatrial enlargement and mild right ventricular enlargement. Pulmonary pressure moderately increased. Nuclear study September 2017 showed ejection fraction 28%. No ischemia. Fixed apical defect. Patient had CT scan January 2018 that showed large right and small left pleural effusion. Probable area of atelectasis in anterior right upper lobe but follow-up recommended in 2-3 months. BNP 318. Patient had thoracentesis January 18.Cytology negative. Patient had repeat thoracentesis June 2018. Fluid not sent for analysis. Since last seen,   Current Outpatient Medications  Medication Sig Dispense Refill  . acetaminophen (TYLENOL) 500 MG tablet Take 1,000 mg by mouth every 6 (six) hours as needed for mild pain or moderate pain.    [name] apixaban (ELIQUIS) 2.5 MG TABS tablet Take 1 tablet (2.5 mg total) by mouth 2 (two) times daily. 180 tablet 1  . Dextromethorphan-Guaifenesin (DELSYM COUGH/CHEST CONGEST DM PO) Take 10 mLs by mouth daily as needed (for cough).     . furosemide (LASIX) 20 MG tablet Take 1 tablet (20 mg total) 2 (two) times daily by mouth. 90 tablet 2  . isosorbide dinitrate (ISORDIL) 30 MG tablet Take 1 tablet (30 mg total) daily by mouth. 90 tablet 3  . losartan (COZAAR) 25 MG tablet Take 1 tablet (25 mg total) by mouth daily. 90 tablet 3   No current facility-administered medications for this visit.      Past Medical History:  Diagnosis Date  . Atrial fibrillation ([place])   . BPH (benign prostatic hyperplasia)   . Dyspnea on exertion    a. 09/2008 NL EF on myoview.  [name] Hearing difficulty    R sided hole in eardrum, only wears L hearing aid  . History of tobacco abuse   . Hypertension   . Lumbar stenosis with neurogenic  claudication    a. 02/2011 s/p decompressive laminectomy.  . Midsternal chest pain    a. 09/2008 Myoview: EF 56%, mild fixed thinning of inf wall particularly @ base -? attenuation.  No ischemia  . PUD (peptic ulcer disease)    Remote, history of  . Urinary hesitancy     Past Surgical History:  Procedure Laterality Date  . HERNIA REPAIR     Left  . LUMBAR LAMINECTOMY/DECOMPRESSION MICRODISCECTOMY  03/04/2011   Procedure: LUMBAR LAMINECTOMY/DECOMPRESSION MICRODISCECTOMY;  Surgeon: Ophelia Charter;  Location: [facility];  Service: Neurosurgery;  Laterality: N/A;  LUMBAR THREE-FOUR ,FOUR-FIVE LAMINECTOMIES  . TYMPANOMASTOIDECTOMY     Left modified radical with type 3 tympanoplasty    Social History   Socioeconomic History  . Marital status: Widowed    Spouse name: Not on file  . Number of children: 2  . Years of education: Not on file  . Highest education level: Not on file  Social Needs  . Financial resource strain: Not on file  . Food insecurity - worry: Not on file  . Food insecurity - inability: Not on file  . Transportation needs - medical: Not on file  . Transportation needs - non-medical: Not on file  Occupational History  . Occupation: [employer]    Comment: Retired  Tobacco Use  . Smoking status: Former Research scientist (life sciences)  . Smokeless tobacco: Never Used  . Tobacco comment: Remote history of smoking  Substance and Sexual Activity  . Alcohol use: No  . Drug use:  No  . Sexual activity: Not on file  Other Topics Concern  . Not on file  Social History Narrative   Lives with his wife in [place].   He swims daily.   He has 2 grown daughters.   1 Caffeine drink daily     Family History  Problem Relation Age of Onset  . Lung cancer Mother   . Appendicitis Father   . Diabetes Sister        DM  . Coronary artery disease Neg Hx        No premature CAD  . Colon cancer Neg Hx     ROS: no fevers or chills, productive cough, hemoptysis, dysphasia,  odynophagia, melena, hematochezia, dysuria, hematuria, rash, seizure activity, orthopnea, PND, pedal edema, claudication. Remaining systems are negative.  Physical Exam: Well-developed well-nourished in no acute distress.  Skin is warm and dry.  HEENT is normal.  Neck is supple.  Chest is clear to auscultation with normal expansion.  Cardiovascular exam is regular rate and rhythm.  Abdominal exam nontender or distended. No masses palpated. Extremities show no edema. neuro grossly intact  ECG- personally reviewed  A/P  1  Kirk Ruths, MD

## 2017-03-16 NOTE — Telephone Encounter (Signed)
Spoke with pt, he received a letter from his pharmacy about a change from isosorbide mono to isordil and he is wondering why the change. Will review the patients chart and call him back.

## 2017-03-16 NOTE — Telephone Encounter (Signed)
Spoke with pt, aware the change in medication is fine per dr Stanford Breed, patient to bring all his medications to the appointment in December to discuss.

## 2017-03-19 ENCOUNTER — Encounter: Payer: Self-pay | Admitting: Cardiology

## 2017-03-29 ENCOUNTER — Ambulatory Visit: Payer: Medicare Other | Admitting: Cardiology

## 2017-03-30 NOTE — Progress Notes (Signed)
HPI: FU atrial fibrillation. We have elected rate control and anticoagulation for afib. Holter monitor in February of 2014 showed sinus rhythm, mobitz 1; toprol DCed. Echocardiogram August2017 showed ejection fraction 25-30%, moderate mitral regurgitation, biatrial enlargement and mild right ventricular enlargement. Pulmonary pressure moderately increased. Nuclear study September 2017 showed ejection fraction 28%. No ischemia. Fixed apical defect. Patient had CT scan January 2018 that showed large right and small left pleural effusion. Probable area of atelectasis in anterior right upper lobe but follow-up recommended in 2-3 months. BNP 318. Patient had thoracentesis January 18.Cytology negative. Patient had repeat thoracentesis June 2018. Fluid not sent for analysis. Since last seen, patient denies dyspnea, chest pain, palpitations or syncope. No bleeding.  Current Outpatient Medications  Medication Sig Dispense Refill  . acetaminophen (TYLENOL) 500 MG tablet Take 1,000 mg by mouth every 6 (six) hours as needed for mild pain or moderate pain.    [name] apixaban (ELIQUIS) 2.5 MG TABS tablet Take 1 tablet (2.5 mg total) by mouth 2 (two) times daily. 180 tablet 1  . Dextromethorphan-Guaifenesin (DELSYM COUGH/CHEST CONGEST DM PO) Take 10 mLs by mouth daily as needed (for cough).     . furosemide (LASIX) 20 MG tablet Take 1 tablet (20 mg total) 2 (two) times daily by mouth. 90 tablet 2  . isosorbide dinitrate (ISORDIL) 30 MG tablet Take 1 tablet (30 mg total) daily by mouth. 90 tablet 3  . losartan (COZAAR) 25 MG tablet Take 1 tablet (25 mg total) by mouth daily. 90 tablet 3   No current facility-administered medications for this visit.      Past Medical History:  Diagnosis Date  . Atrial fibrillation ([place])   . BPH (benign prostatic hyperplasia)   . Dyspnea on exertion    a. 09/2008 NL EF on myoview.  [name] Hearing difficulty    R sided hole in eardrum, only wears L hearing aid  . History of  tobacco abuse   . Hypertension   . Lumbar stenosis with neurogenic claudication    a. 02/2011 s/p decompressive laminectomy.  . Midsternal chest pain    a. 09/2008 Myoview: EF 56%, mild fixed thinning of inf wall particularly @ base -? attenuation.  No ischemia  . PUD (peptic ulcer disease)    Remote, history of  . Urinary hesitancy     Past Surgical History:  Procedure Laterality Date  . HERNIA REPAIR     Left  . LUMBAR LAMINECTOMY/DECOMPRESSION MICRODISCECTOMY  03/04/2011   Procedure: LUMBAR LAMINECTOMY/DECOMPRESSION MICRODISCECTOMY;  Surgeon: Ophelia Charter;  Location: [facility];  Service: Neurosurgery;  Laterality: N/A;  LUMBAR THREE-FOUR ,FOUR-FIVE LAMINECTOMIES  . TYMPANOMASTOIDECTOMY     Left modified radical with type 3 tympanoplasty    Social History   Socioeconomic History  . Marital status: Widowed    Spouse name: Not on file  . Number of children: 2  . Years of education: Not on file  . Highest education level: Not on file  Social Needs  . Financial resource strain: Not on file  . Food insecurity - worry: Not on file  . Food insecurity - inability: Not on file  . Transportation needs - medical: Not on file  . Transportation needs - non-medical: Not on file  Occupational History  . Occupation: [employer]    Comment: Retired  Tobacco Use  . Smoking status: Former Research scientist (life sciences)  . Smokeless tobacco: Never Used  . Tobacco comment: Remote history of smoking  Substance and Sexual Activity  .  Alcohol use: No  . Drug use: No  . Sexual activity: Not on file  Other Topics Concern  . Not on file  Social History Narrative   Lives with his wife in [place].   He swims daily.   He has 2 grown daughters.   1 Caffeine drink daily     Family History  Problem Relation Age of Onset  . Lung cancer Mother   . Appendicitis Father   . Diabetes Sister        DM  . Coronary artery disease Neg Hx        No premature CAD  . Colon cancer Neg Hx     ROS: no  fevers or chills, productive cough, hemoptysis, dysphasia, odynophagia, melena, hematochezia, dysuria, hematuria, rash, seizure activity, orthopnea, PND, pedal edema, claudication. Remaining systems are negative.  Physical Exam: Well-developed frail in no acute distress.  Skin is warm and dry.  HEENT is normal.  Neck is supple.  Chest is clear to auscultation with normal expansion.  Cardiovascular exam is irregular Abdominal exam nontender or distended. No masses palpated. Extremities show no edema. neuro grossly intact   A/P  1 permanent atrial fibrillation-patient's heart rate is controlled on no medications. Continue apixaban. Check hemoglobin and renal function.  2 chronic systolic congestive heart failure-patient appears to be euvolemic. Continue present dose of Lasix. Check potassium and renal function. Continue fluid restriction and low sodium diet.  3 hypertension-blood pressure is controlled. Continue present medications.  4 cardiomyopathy-presumed nonischemic. Previous nuclear study not suggestive of ischemic etiology. We have been conservative given patient is approaching 83 years old. Continue ARB.No beta blocker given history of bradycardia.  5 right pleural effusion-felt possibly secondary to diastolic dysfunction. Continue present dose of diuretic.  Kirk Ruths, MD

## 2017-04-02 ENCOUNTER — Encounter: Payer: Self-pay | Admitting: Cardiology

## 2017-04-02 ENCOUNTER — Ambulatory Visit (INDEPENDENT_AMBULATORY_CARE_PROVIDER_SITE_OTHER): Payer: Medicare Other | Admitting: Cardiology

## 2017-04-02 VITALS — BP 126/62 | HR 64 | Ht 70.5 in | Wt 180.0 lb

## 2017-04-02 DIAGNOSIS — I420 Dilated cardiomyopathy: Secondary | ICD-10-CM

## 2017-04-02 DIAGNOSIS — I5022 Chronic systolic (congestive) heart failure: Secondary | ICD-10-CM

## 2017-04-02 DIAGNOSIS — I482 Chronic atrial fibrillation, unspecified: Secondary | ICD-10-CM

## 2017-04-02 DIAGNOSIS — I10 Essential (primary) hypertension: Secondary | ICD-10-CM

## 2017-04-02 NOTE — Patient Instructions (Signed)
Lab work today   [facility]    Your physician wants you to follow-up 6 months with Dr.Crenshaw. You will receive a reminder letter in the mail two months in advance. If you don't receive a letter, please call our office to schedule the follow-up appointment.

## 2017-04-03 LAB — CBC WITH DIFFERENTIAL/PLATELET
BASOS: 1 %
Basophils Absolute: 0 10*3/uL (ref 0.0–0.2)
EOS (ABSOLUTE): 0.1 10*3/uL (ref 0.0–0.4)
Eos: 3 %
HEMATOCRIT: 37.4 % — AB (ref 37.5–51.0)
Hemoglobin: 12.8 g/dL — ABNORMAL LOW (ref 13.0–17.7)
IMMATURE GRANS (ABS): 0 10*3/uL (ref 0.0–0.1)
Immature Granulocytes: 0 %
LYMPHS: 35 %
Lymphocytes Absolute: 2 10*3/uL (ref 0.7–3.1)
MCH: 31.4 pg (ref 26.6–33.0)
MCHC: 34.2 g/dL (ref 31.5–35.7)
MCV: 92 fL (ref 79–97)
MONOCYTES: 11 %
Monocytes Absolute: 0.6 10*3/uL (ref 0.1–0.9)
NEUTROS ABS: 2.9 10*3/uL (ref 1.4–7.0)
Neutrophils: 50 %
Platelets: 171 10*3/uL (ref 150–379)
RBC: 4.07 x10E6/uL — ABNORMAL LOW (ref 4.14–5.80)
RDW: 14.6 % (ref 12.3–15.4)
WBC: 5.7 10*3/uL (ref 3.4–10.8)

## 2017-04-03 LAB — BASIC METABOLIC PANEL
BUN / CREAT RATIO: 20 (ref 10–24)
BUN: 30 mg/dL (ref 10–36)
CHLORIDE: 101 mmol/L (ref 96–106)
CO2: 25 mmol/L (ref 20–29)
Calcium: 9.2 mg/dL (ref 8.6–10.2)
Creatinine, Ser: 1.47 mg/dL — ABNORMAL HIGH (ref 0.76–1.27)
GFR calc Af Amer: 47 mL/min/{1.73_m2} — ABNORMAL LOW (ref >59)
GFR calc non Af Amer: 40 mL/min/{1.73_m2} — ABNORMAL LOW (ref >59)
GLUCOSE: 118 mg/dL — AB (ref 65–99)
Potassium: 4.5 mmol/L (ref 3.5–5.2)
SODIUM: 143 mmol/L (ref 134–144)

## 2017-05-02 ENCOUNTER — Emergency Department (HOSPITAL_COMMUNITY)
Admission: EM | Admit: 2017-05-02 | Discharge: 2017-05-02 | Disposition: A | Discharge status: Home / Self Care | Payer: Medicare Other | Attending: Emergency Medicine | Admitting: Emergency Medicine

## 2017-05-02 ENCOUNTER — Other Ambulatory Visit: Payer: Self-pay

## 2017-05-02 ENCOUNTER — Encounter (HOSPITAL_COMMUNITY): Payer: Self-pay | Admitting: Emergency Medicine

## 2017-05-02 ENCOUNTER — Emergency Department (HOSPITAL_COMMUNITY): Payer: Medicare Other

## 2017-05-02 DIAGNOSIS — I129 Hypertensive chronic kidney disease with stage 1 through stage 4 chronic kidney disease, or unspecified chronic kidney disease: Secondary | ICD-10-CM | POA: Insufficient documentation

## 2017-05-02 DIAGNOSIS — M545 Low back pain, unspecified: Secondary | ICD-10-CM

## 2017-05-02 DIAGNOSIS — Z7901 Long term (current) use of anticoagulants: Secondary | ICD-10-CM | POA: Diagnosis not present

## 2017-05-02 DIAGNOSIS — Z79899 Other long term (current) drug therapy: Secondary | ICD-10-CM | POA: Diagnosis not present

## 2017-05-02 DIAGNOSIS — R52 Pain, unspecified: Secondary | ICD-10-CM

## 2017-05-02 DIAGNOSIS — N183 Chronic kidney disease, stage 3 (moderate): Secondary | ICD-10-CM | POA: Diagnosis not present

## 2017-05-02 DIAGNOSIS — Z87891 Personal history of nicotine dependence: Secondary | ICD-10-CM | POA: Insufficient documentation

## 2017-05-02 LAB — URINALYSIS, ROUTINE W REFLEX MICROSCOPIC
BILIRUBIN URINE: NEGATIVE
GLUCOSE, UA: NEGATIVE mg/dL
HGB URINE DIPSTICK: NEGATIVE
KETONES UR: NEGATIVE mg/dL
Leukocytes, UA: NEGATIVE
Nitrite: NEGATIVE
PH: 6 (ref 5.0–8.0)
Protein, ur: 30 mg/dL — AB
SQUAMOUS EPITHELIAL / LPF: NONE SEEN
Specific Gravity, Urine: 1.021 (ref 1.005–1.030)

## 2017-05-02 MED ORDER — OXYCODONE-ACETAMINOPHEN 5-325 MG PO TABS: 1.0000 | ORAL_TABLET | ORAL | 0 refills | Status: DC | PRN

## 2017-05-02 MED ORDER — LORAZEPAM 0.5 MG PO TABS
0.5000 mg | ORAL_TABLET | Freq: Once | ORAL | Status: AC
  Administered 2017-05-02: 0.5 mg via ORAL
  Filled 2017-05-02: qty 1

## 2017-05-02 MED ORDER — DEXAMETHASONE 4 MG PO TABS
8.0000 mg | ORAL_TABLET | Freq: Once | ORAL | Status: AC
  Administered 2017-05-02: 8 mg via ORAL
  Filled 2017-05-02: qty 2

## 2017-05-02 MED ORDER — DIAZEPAM 5 MG PO TABS: 2.5000 mg | ORAL_TABLET | Freq: Three times a day (TID) | ORAL | 0 refills | Status: DC | PRN

## 2017-05-02 MED ORDER — HYDROMORPHONE HCL 1 MG/ML IJ SOLN
1.0000 mg | Freq: Once | INTRAMUSCULAR | Status: AC
  Administered 2017-05-02: 1 mg via INTRAMUSCULAR
  Filled 2017-05-02: qty 1

## 2017-05-02 NOTE — ED Notes (Signed)
Patient transported to X-ray 

## 2017-05-02 NOTE — ED Triage Notes (Signed)
Pt is c/o of l/hip and l/leg pain. Pt stated that he has had this pain for years. Pt c/o ncreased pain over last week. Denies fall. Pt is alert, appropriate. Son in Sports coach at bedside

## 2017-05-04 NOTE — ED Provider Notes (Signed)
[facility] DEPT Provider Note   CSN: [id number] Arrival date & time: 05/02/17  1318     History   Chief Complaint Chief Complaint  Patient presents with  . Back Pain  . Hip Pain    HPI Ralph Mendoza is a 84 y.o. male.  HPI   84 year old male with left lower back pain.  He has had this pain intermittently over the past several years.  Worsening over the past few days.  Denies any acute trauma.  The pain is tolerable at rest.  Sniffily increased when standing and ambulating.  Does not radiate.  No acute urinary complaints.  Past Medical History:  Diagnosis Date  . Atrial fibrillation ([place])   . BPH (benign prostatic hyperplasia)   . Dyspnea on exertion    a. 09/2008 NL EF on myoview.  [name] Hearing difficulty    R sided hole in eardrum, only wears L hearing aid  . History of tobacco abuse   . Hypertension   . Lumbar stenosis with neurogenic claudication    a. 02/2011 s/p decompressive laminectomy.  . Midsternal chest pain    a. 09/2008 Myoview: EF 56%, mild fixed thinning of inf wall particularly @ base -? attenuation.  No ischemia  . PUD (peptic ulcer disease)    Remote, history of  . Urinary hesitancy     Patient Active Problem List   Diagnosis Date Noted  . Recurrent right pleural effusion 09/27/2016  . CKD (chronic kidney disease) stage 3, GFR 30-59 ml/min (HCC) 09/27/2016  . Anemia of chronic disease 09/27/2016  . Atrial fibrillation ([place]) 05/17/2012  . Essential hypertension 09/10/2008    Past Surgical History:  Procedure Laterality Date  . HERNIA REPAIR     Left  . LUMBAR LAMINECTOMY/DECOMPRESSION MICRODISCECTOMY  03/04/2011   Procedure: LUMBAR LAMINECTOMY/DECOMPRESSION MICRODISCECTOMY;  Surgeon: Ophelia Charter;  Location: [place] [facility] ORS;  Service: Neurosurgery;  Laterality: N/A;  LUMBAR THREE-FOUR ,FOUR-FIVE LAMINECTOMIES  . TYMPANOMASTOIDECTOMY     Left modified radical with type 3 tympanoplasty       Home Medications      Prior to Admission medications   Medication Sig Start Date End Date Taking? Authorizing Provider  acetaminophen (TYLENOL) 500 MG tablet Take 1,000 mg by mouth every 6 (six) hours as needed for mild pain or moderate pain.   Yes [provider]  apixaban (ELIQUIS) 2.5 MG TABS tablet Take 1 tablet (2.5 mg total) by mouth 2 (two) times daily. 11/16/16  Yes Lelon Perla, MD  Dextromethorphan-Guaifenesin (DELSYM COUGH/CHEST CONGEST DM PO) Take 10 mLs by mouth daily as needed (for cough).    Yes [provider]  furosemide (LASIX) 20 MG tablet Take 1 tablet (20 mg total) 2 (two) times daily by mouth. 02/24/17  Yes Isaiah Serge, NP  isosorbide dinitrate (ISORDIL) 30 MG tablet Take 1 tablet (30 mg total) daily by mouth. 02/23/17  Yes Lelon Perla, MD  losartan (COZAAR) 25 MG tablet Take 1 tablet (25 mg total) by mouth daily. 03/16/17 06/14/17 Yes Lelon Perla, MD  Multiple Vitamins-Minerals (PRESERVISION AREDS 2+MULTI VIT) CAPS Take 1 capsule by mouth daily.   Yes [provider]  diazepam (VALIUM) 5 MG tablet Take 0.5 tablets (2.5 mg total) by mouth every 8 (eight) hours as needed for anxiety. 05/02/17   Virgel Manifold, MD  oxyCODONE-acetaminophen (PERCOCET/ROXICET) 5-325 MG tablet Take 1 tablet by mouth every 4 (four) hours as needed for severe pain. 05/02/17   Virgel Manifold, MD  Family History Family History  Problem Relation Age of Onset  . Lung cancer Mother   . Appendicitis Father   . Diabetes Sister        DM  . Coronary artery disease Neg Hx        No premature CAD  . Colon cancer Neg Hx     Social History Social History   Tobacco Use  . Smoking status: Former Research scientist (life sciences)  . Smokeless tobacco: Never Used  . Tobacco comment: Remote history of smoking  Substance Use Topics  . Alcohol use: No  . Drug use: No     Allergies   Patient has no known allergies.   Review of Systems Review of Systems  All systems reviewed and negative, other  than as noted in HPI.  Physical Exam Updated Vital Signs BP (!) 145/66   Pulse (!) 111   Temp 97.8 F (36.6 C) (Oral)   Resp 16   Wt 82.6 kg (182 lb)   SpO2 94%   BMI 25.75 kg/m   Physical Exam  Constitutional: He appears well-developed and well-nourished. No distress.  HENT:  Head: Normocephalic and atraumatic.  Eyes: Conjunctivae are normal. Right eye exhibits no discharge. Left eye exhibits no discharge.  Neck: Neck supple.  Cardiovascular: Normal rate, regular rhythm and normal heart sounds. Exam reveals no gallop and no friction rub.  No murmur heard. Pulmonary/Chest: Effort normal and breath sounds normal. No respiratory distress.  Abdominal: Soft. He exhibits no distension. There is no tenderness.  Musculoskeletal: He exhibits no edema or tenderness.  Mild tenderness to palpation maximal near left SI joint.  No overlying skin changes.  No midline spinal tenderness.  Negative straight leg test.  Neurovascularly intact and lower extremities.  Neurological: He is alert.  Skin: Skin is warm and dry.  Psychiatric: He has a normal mood and affect. His behavior is normal. Thought content normal.  Nursing note and vitals reviewed.    ED Treatments / Results  Labs (all labs ordered are listed, but only abnormal results are displayed) Labs Reviewed  URINALYSIS, ROUTINE W REFLEX MICROSCOPIC - Abnormal; Notable for the following components:      Result Value   Protein, ur 30 (*)    Bacteria, UA FEW (*)    All other components within normal limits    EKG  EKG Interpretation None       Radiology Dg Lumbar Spine Complete  Result Date: 05/02/2017 CLINICAL DATA:  Left hip and leg pain. EXAM: LUMBAR SPINE - COMPLETE 4+ VIEW COMPARISON:  Abdomen and pelvis CT dated 06/03/2016. FINDINGS: Five non-rib-bearing lumbar vertebrae. Mild dextroconvex lumbar rotary scoliosis. Large anterior and lateral spurs at multiple levels. Facet degenerative changes throughout the lumbar spine.  No fractures, pars defects or subluxations. Atheromatous arterial calcifications. IMPRESSION: Extensive degenerative changes and mild scoliosis. Electronically Signed   By: Claudie Revering M.D.   On: 05/02/2017 15:25   Dg Hip Unilat With Pelvis 2-3 Views Left  Result Date: 05/02/2017 CLINICAL DATA:  Left hip and leg pain.  No known injury. EXAM: DG HIP (WITH OR WITHOUT PELVIS) 2-3V LEFT COMPARISON:  None. FINDINGS: Mild to moderate left femoral head and neck junction spur formation and mild right femoral head and neck junction spur formation. Minimal left hip joint space narrowing. Lower lumbar spine degenerative changes. Mild atheromatous arterial calcifications. IMPRESSION: 1. Mild left hip degenerative changes and minimal right hip degenerative changes. 2. Lower lumbar spine degenerative changes. Electronically Signed   By: Claudie Revering  M.D.   On: 05/02/2017 15:23    Procedures Procedures (including critical care time)  Medications Ordered in ED Medications  HYDROmorphone (DILAUDID) injection 1 mg (1 mg Intramuscular Given 05/02/17 1539)  dexamethasone (DECADRON) tablet 8 mg (8 mg Oral Given 05/02/17 1538)  LORazepam (ATIVAN) tablet 0.5 mg (0.5 mg Oral Given 05/02/17 1538)     Initial Impression / Assessment and Plan / ED Course  I have reviewed the triage vital signs and the nursing notes.  Pertinent labs & imaging results that were available during my care of the patient were reviewed by me and considered in my medical decision making (see chart for details).     84 year old male with atraumatic left lower back pain.  Likely musculoskeletal.  No overt red flags.  Plan symptomatic treatment.  Return precautions were discussed.  PCP follow-up otherwise.  Final Clinical Impressions(s) / ED Diagnoses   Final diagnoses:  Acute left-sided low back pain without sciatica    ED Discharge Orders        Ordered    oxyCODONE-acetaminophen (PERCOCET/ROXICET) 5-325 MG tablet  Every 4 hours PRN       05/02/17 1611    diazepam (VALIUM) 5 MG tablet  Every 8 hours PRN     05/02/17 1611       Virgel Manifold, MD 05/04/17 1027

## 2017-05-26 ENCOUNTER — Encounter (HOSPITAL_COMMUNITY): Payer: Self-pay | Admitting: Emergency Medicine

## 2017-05-26 ENCOUNTER — Emergency Department (HOSPITAL_COMMUNITY): Payer: Medicare Other

## 2017-05-26 ENCOUNTER — Inpatient Hospital Stay (HOSPITAL_COMMUNITY)
Admission: EM | Admit: 2017-05-26 | Discharge: 2017-05-30 | DRG: 194 | Disposition: A | Discharge status: Home Health | Payer: Medicare Other | Attending: Internal Medicine | Admitting: Internal Medicine

## 2017-05-26 DIAGNOSIS — J181 Lobar pneumonia, unspecified organism: Principal | ICD-10-CM

## 2017-05-26 DIAGNOSIS — D696 Thrombocytopenia, unspecified: Secondary | ICD-10-CM

## 2017-05-26 DIAGNOSIS — R748 Abnormal levels of other serum enzymes: Secondary | ICD-10-CM | POA: Diagnosis not present

## 2017-05-26 DIAGNOSIS — N183 Chronic kidney disease, stage 3 unspecified: Secondary | ICD-10-CM | POA: Diagnosis present

## 2017-05-26 DIAGNOSIS — J069 Acute upper respiratory infection, unspecified: Secondary | ICD-10-CM

## 2017-05-26 DIAGNOSIS — I4581 Long QT syndrome: Secondary | ICD-10-CM | POA: Diagnosis present

## 2017-05-26 DIAGNOSIS — R778 Other specified abnormalities of plasma proteins: Secondary | ICD-10-CM

## 2017-05-26 DIAGNOSIS — I4891 Unspecified atrial fibrillation: Secondary | ICD-10-CM | POA: Diagnosis present

## 2017-05-26 DIAGNOSIS — R0600 Dyspnea, unspecified: Secondary | ICD-10-CM

## 2017-05-26 DIAGNOSIS — R7989 Other specified abnormal findings of blood chemistry: Secondary | ICD-10-CM

## 2017-05-26 DIAGNOSIS — I5022 Chronic systolic (congestive) heart failure: Secondary | ICD-10-CM

## 2017-05-26 DIAGNOSIS — I482 Chronic atrial fibrillation: Secondary | ICD-10-CM | POA: Diagnosis present

## 2017-05-26 DIAGNOSIS — E860 Dehydration: Secondary | ICD-10-CM

## 2017-05-26 DIAGNOSIS — Z7901 Long term (current) use of anticoagulants: Secondary | ICD-10-CM

## 2017-05-26 DIAGNOSIS — Z801 Family history of malignant neoplasm of trachea, bronchus and lung: Secondary | ICD-10-CM

## 2017-05-26 DIAGNOSIS — E872 Acidosis: Secondary | ICD-10-CM | POA: Diagnosis present

## 2017-05-26 DIAGNOSIS — D6959 Other secondary thrombocytopenia: Secondary | ICD-10-CM | POA: Diagnosis present

## 2017-05-26 DIAGNOSIS — Z87891 Personal history of nicotine dependence: Secondary | ICD-10-CM

## 2017-05-26 DIAGNOSIS — R001 Bradycardia, unspecified: Secondary | ICD-10-CM | POA: Diagnosis not present

## 2017-05-26 DIAGNOSIS — H918X3 Other specified hearing loss, bilateral: Secondary | ICD-10-CM | POA: Diagnosis present

## 2017-05-26 DIAGNOSIS — R059 Cough, unspecified: Secondary | ICD-10-CM

## 2017-05-26 DIAGNOSIS — J189 Pneumonia, unspecified organism: Secondary | ICD-10-CM | POA: Diagnosis present

## 2017-05-26 DIAGNOSIS — Z974 Presence of external hearing-aid: Secondary | ICD-10-CM

## 2017-05-26 DIAGNOSIS — B9789 Other viral agents as the cause of diseases classified elsewhere: Secondary | ICD-10-CM

## 2017-05-26 DIAGNOSIS — R05 Cough: Secondary | ICD-10-CM

## 2017-05-26 DIAGNOSIS — I130 Hypertensive heart and chronic kidney disease with heart failure and stage 1 through stage 4 chronic kidney disease, or unspecified chronic kidney disease: Secondary | ICD-10-CM | POA: Diagnosis present

## 2017-05-26 DIAGNOSIS — Z79899 Other long term (current) drug therapy: Secondary | ICD-10-CM

## 2017-05-26 DIAGNOSIS — D631 Anemia in chronic kidney disease: Secondary | ICD-10-CM | POA: Diagnosis present

## 2017-05-26 LAB — CBC
HCT: 34.4 % — ABNORMAL LOW (ref 39.0–52.0)
Hemoglobin: 11.9 g/dL — ABNORMAL LOW (ref 13.0–17.0)
MCH: 31.3 pg (ref 26.0–34.0)
MCHC: 34.6 g/dL (ref 30.0–36.0)
MCV: 90.5 fL (ref 78.0–100.0)
PLATELETS: 142 10*3/uL — AB (ref 150–400)
RBC: 3.8 MIL/uL — AB (ref 4.22–5.81)
RDW: 13.8 % (ref 11.5–15.5)
WBC: 7.7 10*3/uL (ref 4.0–10.5)

## 2017-05-26 LAB — BASIC METABOLIC PANEL
Anion gap: 12 (ref 5–15)
BUN: 29 mg/dL — ABNORMAL HIGH (ref 6–20)
CHLORIDE: 95 mmol/L — AB (ref 101–111)
CO2: 26 mmol/L (ref 22–32)
CREATININE: 1.59 mg/dL — AB (ref 0.61–1.24)
Calcium: 8.4 mg/dL — ABNORMAL LOW (ref 8.9–10.3)
GFR calc non Af Amer: 35 mL/min — ABNORMAL LOW (ref >60)
GFR, EST AFRICAN AMERICAN: 41 mL/min — AB (ref >60)
Glucose, Bld: 142 mg/dL — ABNORMAL HIGH (ref 65–99)
Potassium: 3.4 mmol/L — ABNORMAL LOW (ref 3.5–5.1)
Sodium: 133 mmol/L — ABNORMAL LOW (ref 135–145)

## 2017-05-26 LAB — I-STAT TROPONIN, ED: Troponin i, poc: 0.11 ng/mL (ref 0.00–0.08)

## 2017-05-26 LAB — I-STAT CG4 LACTIC ACID, ED: LACTIC ACID, VENOUS: 2.04 mmol/L — AB (ref 0.5–1.9)

## 2017-05-26 MED ORDER — SODIUM CHLORIDE 0.9 % IV SOLN
Freq: Once | INTRAVENOUS | Status: AC
  Administered 2017-05-27: via INTRAVENOUS

## 2017-05-26 MED ORDER — DEXTROSE 5 % IV SOLN
500.0000 mg | Freq: Once | INTRAVENOUS | Status: AC
  Administered 2017-05-27: 500 mg via INTRAVENOUS
  Filled 2017-05-26: qty 500

## 2017-05-26 MED ORDER — DEXTROSE 5 % IV SOLN
1.0000 g | Freq: Once | INTRAVENOUS | Status: AC
  Administered 2017-05-26: 1 g via INTRAVENOUS
  Filled 2017-05-26: qty 10

## 2017-05-26 MED ORDER — ASPIRIN 81 MG PO CHEW
324.0000 mg | CHEWABLE_TABLET | Freq: Once | ORAL | Status: AC
Start: 2017-05-26 — End: 2017-05-27
  Administered 2017-05-27: 324 mg via ORAL
  Filled 2017-05-26: qty 4

## 2017-05-26 MED ORDER — SODIUM CHLORIDE 0.9 % IV BOLUS (SEPSIS)
500.0000 mL | Freq: Once | INTRAVENOUS | Status: AC
  Administered 2017-05-26: 500 mL via INTRAVENOUS

## 2017-05-26 MED ORDER — IPRATROPIUM-ALBUTEROL 0.5-2.5 (3) MG/3ML IN SOLN
3.0000 mL | Freq: Once | RESPIRATORY_TRACT | Status: AC
  Administered 2017-05-27: 3 mL via RESPIRATORY_TRACT
  Filled 2017-05-26: qty 3

## 2017-05-26 NOTE — ED Notes (Signed)
Writer notified EPD Issacs of abnormal I-stat trop result

## 2017-05-26 NOTE — ED Triage Notes (Signed)
Pt comes in with complaints of congestion and heavy cough over the past week and a half. Afebrile on assessment. Pt states he has had to have fluid drained off of his lungs before.  Ambulatory with cane. Son in Sports coach at bedside.

## 2017-05-27 ENCOUNTER — Other Ambulatory Visit: Payer: Self-pay

## 2017-05-27 DIAGNOSIS — D6959 Other secondary thrombocytopenia: Secondary | ICD-10-CM | POA: Diagnosis present

## 2017-05-27 DIAGNOSIS — J181 Lobar pneumonia, unspecified organism: Secondary | ICD-10-CM | POA: Diagnosis present

## 2017-05-27 DIAGNOSIS — H918X3 Other specified hearing loss, bilateral: Secondary | ICD-10-CM | POA: Diagnosis present

## 2017-05-27 DIAGNOSIS — E860 Dehydration: Secondary | ICD-10-CM | POA: Diagnosis present

## 2017-05-27 DIAGNOSIS — N183 Chronic kidney disease, stage 3 (moderate): Secondary | ICD-10-CM | POA: Diagnosis present

## 2017-05-27 DIAGNOSIS — I5022 Chronic systolic (congestive) heart failure: Secondary | ICD-10-CM | POA: Diagnosis present

## 2017-05-27 DIAGNOSIS — D696 Thrombocytopenia, unspecified: Secondary | ICD-10-CM

## 2017-05-27 DIAGNOSIS — E872 Acidosis: Secondary | ICD-10-CM | POA: Diagnosis present

## 2017-05-27 DIAGNOSIS — R748 Abnormal levels of other serum enzymes: Secondary | ICD-10-CM | POA: Diagnosis present

## 2017-05-27 DIAGNOSIS — Z7901 Long term (current) use of anticoagulants: Secondary | ICD-10-CM | POA: Diagnosis not present

## 2017-05-27 DIAGNOSIS — R0600 Dyspnea, unspecified: Secondary | ICD-10-CM | POA: Diagnosis not present

## 2017-05-27 DIAGNOSIS — Z79899 Other long term (current) drug therapy: Secondary | ICD-10-CM | POA: Diagnosis not present

## 2017-05-27 DIAGNOSIS — I10 Essential (primary) hypertension: Secondary | ICD-10-CM | POA: Diagnosis not present

## 2017-05-27 DIAGNOSIS — I482 Chronic atrial fibrillation: Secondary | ICD-10-CM | POA: Diagnosis present

## 2017-05-27 DIAGNOSIS — Z974 Presence of external hearing-aid: Secondary | ICD-10-CM | POA: Diagnosis not present

## 2017-05-27 DIAGNOSIS — Z87891 Personal history of nicotine dependence: Secondary | ICD-10-CM | POA: Diagnosis not present

## 2017-05-27 DIAGNOSIS — J189 Pneumonia, unspecified organism: Secondary | ICD-10-CM | POA: Diagnosis present

## 2017-05-27 DIAGNOSIS — I130 Hypertensive heart and chronic kidney disease with heart failure and stage 1 through stage 4 chronic kidney disease, or unspecified chronic kidney disease: Secondary | ICD-10-CM | POA: Diagnosis present

## 2017-05-27 DIAGNOSIS — I4581 Long QT syndrome: Secondary | ICD-10-CM | POA: Diagnosis present

## 2017-05-27 DIAGNOSIS — D631 Anemia in chronic kidney disease: Secondary | ICD-10-CM | POA: Diagnosis present

## 2017-05-27 DIAGNOSIS — R001 Bradycardia, unspecified: Secondary | ICD-10-CM | POA: Diagnosis not present

## 2017-05-27 DIAGNOSIS — Z801 Family history of malignant neoplasm of trachea, bronchus and lung: Secondary | ICD-10-CM | POA: Diagnosis not present

## 2017-05-27 LAB — STREP PNEUMONIAE URINARY ANTIGEN: Strep Pneumo Urinary Antigen: NEGATIVE

## 2017-05-27 LAB — TROPONIN I: TROPONIN I: 0.08 ng/mL — AB (ref <0.03)

## 2017-05-27 LAB — BRAIN NATRIURETIC PEPTIDE: B NATRIURETIC PEPTIDE 5: 402.3 pg/mL — AB (ref 0.0–100.0)

## 2017-05-27 LAB — INFLUENZA PANEL BY PCR (TYPE A & B)
INFLAPCR: NEGATIVE
Influenza B By PCR: NEGATIVE

## 2017-05-27 LAB — HIV ANTIBODY (ROUTINE TESTING W REFLEX): HIV Screen 4th Generation wRfx: NONREACTIVE

## 2017-05-27 MED ORDER — DIAZEPAM 5 MG PO TABS: 2.5000 mg | ORAL_TABLET | Freq: Three times a day (TID) | ORAL | Status: DC | PRN

## 2017-05-27 MED ORDER — ENOXAPARIN SODIUM 40 MG/0.4ML ~~LOC~~ SOLN: 40.0000 mg | SUBCUTANEOUS | Status: DC

## 2017-05-27 MED ORDER — PRESERVISION AREDS 2+MULTI VIT PO CAPS: 1.0000 | ORAL_CAPSULE | Freq: Every day | ORAL | Status: DC

## 2017-05-27 MED ORDER — ACETAMINOPHEN 500 MG PO TABS
1000.0000 mg | ORAL_TABLET | Freq: Four times a day (QID) | ORAL | Status: DC | PRN
  Administered 2017-05-27: 1000 mg via ORAL
  Filled 2017-05-27: qty 2

## 2017-05-27 MED ORDER — PROSIGHT PO TABS
1.0000 | ORAL_TABLET | Freq: Every day | ORAL | Status: DC
  Administered 2017-05-27 – 2017-05-30 (×4): 1 via ORAL
  Filled 2017-05-27 (×4): qty 1

## 2017-05-27 MED ORDER — OCUVITE-LUTEIN PO CAPS
1.0000 | ORAL_CAPSULE | Freq: Every day | ORAL | Status: DC
  Filled 2017-05-27: qty 1

## 2017-05-27 MED ORDER — AZITHROMYCIN 250 MG PO TABS
500.0000 mg | ORAL_TABLET | ORAL | Status: DC
  Administered 2017-05-27 – 2017-05-29 (×3): 500 mg via ORAL
  Filled 2017-05-27 (×3): qty 2

## 2017-05-27 MED ORDER — ISOSORBIDE DINITRATE 20 MG PO TABS: 30.0000 mg | ORAL_TABLET | Freq: Every day | ORAL | Status: DC

## 2017-05-27 MED ORDER — DEXTROSE 5 % IV SOLN
1.0000 g | INTRAVENOUS | Status: DC
  Administered 2017-05-27 – 2017-05-29 (×3): 1 g via INTRAVENOUS
  Filled 2017-05-27 (×4): qty 10

## 2017-05-27 MED ORDER — APIXABAN 2.5 MG PO TABS
2.5000 mg | ORAL_TABLET | Freq: Two times a day (BID) | ORAL | Status: DC
  Administered 2017-05-27 – 2017-05-30 (×7): 2.5 mg via ORAL
  Filled 2017-05-27 (×7): qty 1

## 2017-05-27 MED ORDER — GUAIFENESIN-DM 100-10 MG/5ML PO SYRP
10.0000 mL | ORAL_SOLUTION | ORAL | Status: DC | PRN
Start: 2017-05-27 — End: 2017-05-28
  Administered 2017-05-27: 10 mL via ORAL
  Filled 2017-05-27: qty 10

## 2017-05-27 MED ORDER — SODIUM CHLORIDE 0.9 % IV SOLN
INTRAVENOUS | Status: DC
  Administered 2017-05-27 – 2017-05-28 (×2): via INTRAVENOUS

## 2017-05-27 MED ORDER — GUAIFENESIN-DM 100-10 MG/5ML PO SYRP
10.0000 mL | ORAL_SOLUTION | Freq: Every day | ORAL | Status: DC | PRN
  Administered 2017-05-27: 10 mL via ORAL
  Filled 2017-05-27: qty 10

## 2017-05-27 NOTE — ED Notes (Addendum)
ED TO INPATIENT HANDOFF REPORT  Name/Age/Gender Ralph Mendoza 84 y.o. male  Code Status    Code Status Orders  (From admission, onward)        Start     Ordered   05/27/17 0413  Full code  Continuous     05/27/17 0413    Code Status History    Date Active Date Inactive Code Status Order ID Comments User Context   09/27/2016 15:32 09/29/2016 16:58 Full Code [id number]  Robbie Lis, MD Inpatient      Home/SNF/Other [facility]  Chief Complaint Cough;Nasal Congestion  Level of Care/Admitting Diagnosis ED Disposition    ED Disposition Condition Comment   Admit  Hospital Area: [facility] [[id number]]  Level of Care: Telemetry [5]  Admit to tele based on following criteria: Monitor for Ischemic changes  Diagnosis: Community acquired pneumonia of left lower lobe of lung [facility]) [[id number]]  Admitting Physician: Doreatha Massed  Attending Physician: Etta Quill [phone]  Estimated length of stay: past midnight tomorrow  Certification:: I certify this patient will need inpatient services for at least 2 midnights  PT Class (Do Not Modify): Inpatient [101]  PT Acc Code (Do Not Modify): Private [1]       Medical History Past Medical History:  Diagnosis Date  . Atrial fibrillation ([place])   . BPH (benign prostatic hyperplasia)   . Dyspnea on exertion    a. 09/2008 NL EF on myoview.  [name] Hearing difficulty    R sided hole in eardrum, only wears L hearing aid  . History of tobacco abuse   . Hypertension   . Lumbar stenosis with neurogenic claudication    a. 02/2011 s/p decompressive laminectomy.  . Midsternal chest pain    a. 09/2008 Myoview: EF 56%, mild fixed thinning of inf wall particularly @ base -? attenuation.  No ischemia  . PUD (peptic ulcer disease)    Remote, history of  . Urinary hesitancy     Allergies No Known Allergies  IV Location/Drains/Wounds Patient Lines/Drains/Airways Status   Active Line/Drains/Airways    Name:    Placement date:   Placement time:   Site:   Days:   Peripheral IV 05/27/17 Right Forearm   05/27/17    0040    Forearm   less than 1          Labs/Imaging Results for orders placed or performed during the hospital encounter of 05/26/17 (from the past 48 hour(s))  Basic metabolic panel     Status: Abnormal   Collection Time: 05/26/17 10:34 PM  Result Value Ref Range   Sodium 133 (L) 135 - 145 mmol/L   Potassium 3.4 (L) 3.5 - 5.1 mmol/L   Chloride 95 (L) 101 - 111 mmol/L   CO2 26 22 - 32 mmol/L   Glucose, Bld 142 (H) 65 - 99 mg/dL   BUN 29 (H) 6 - 20 mg/dL   Creatinine, Ser 1.59 (H) 0.61 - 1.24 mg/dL   Calcium 8.4 (L) 8.9 - 10.3 mg/dL   GFR calc non Af Amer 35 (L) >60 mL/min   GFR calc Af Amer 41 (L) >60 mL/min    Comment: (NOTE) The eGFR has been calculated using the CKD EPI equation. This calculation has not been validated in all clinical situations. eGFR's persistently <60 mL/min signify possible Chronic Kidney Disease.    Anion gap 12 5 - 15    Comment: Performed at [facility], [place] [address] [place],  [place] [address]  CBC     Status: Abnormal   Collection Time: 05/26/17 10:34 PM  Result Value Ref Range   WBC 7.7 4.0 - 10.5 K/uL   RBC 3.80 (L) 4.22 - 5.81 MIL/uL   Hemoglobin 11.9 (L) 13.0 - 17.0 g/dL   HCT 34.4 (L) 39.0 - 52.0 %   MCV 90.5 78.0 - 100.0 fL   MCH 31.3 26.0 - 34.0 pg   MCHC 34.6 30.0 - 36.0 g/dL   RDW 13.8 11.5 - 15.5 %   Platelets 142 (L) 150 - 400 K/uL    Comment: Performed at [facility], [place] [address] [place], [place] [address]  I-stat troponin, ED     Status: Abnormal   Collection Time: 05/26/17 10:45 PM  Result Value Ref Range   Troponin i, poc 0.11 (HH) 0.00 - 0.08 ng/mL   Comment NOTIFIED PHYSICIAN    Comment 3            Comment: Due to the release kinetics of cTnI, a negative result within the first hours of the onset of symptoms does not rule out myocardial infarction with certainty. If  myocardial infarction is still suspected, repeat the test at appropriate intervals.   Brain natriuretic peptide     Status: Abnormal   Collection Time: 05/26/17 11:20 PM  Result Value Ref Range   B Natriuretic Peptide 402.3 (H) 0.0 - 100.0 pg/mL    Comment: Performed at [facility], [place] [address] [place], [place] [address]  Influenza panel by PCR (type A & B)     Status: None   Collection Time: 05/27/17 12:00 AM  Result Value Ref Range   Influenza A By PCR NEGATIVE NEGATIVE   Influenza B By PCR NEGATIVE NEGATIVE    Comment: (NOTE) The Xpert Xpress Flu assay is intended as an aid in the diagnosis of  influenza and should not be used as a sole basis for treatment.  This  assay is FDA approved for nasopharyngeal swab specimens only. Nasal  washings and aspirates are unacceptable for Xpert Xpress Flu testing. Performed at [facility], [place] [address] [place], [place] [address]   I-Stat CG4 Lactic Acid, ED     Status: Abnormal   Collection Time: 05/27/17 12:03 AM  Result Value Ref Range   Lactic Acid, Venous 2.04 (HH) 0.5 - 1.9 mmol/L   Comment NOTIFIED PHYSICIAN   Troponin I (q 6hr x 3)     Status: Abnormal   Collection Time: 05/27/17  5:09 AM  Result Value Ref Range   Troponin I 0.08 (HH) <0.03 ng/mL    Comment: CRITICAL RESULT CALLED TO, READ BACK BY AND VERIFIED WITH: NASH,J RN 2.7.19 _0  ZANDO,C Performed at [facility] [place] [address] [place], [place] [address]    Dg Chest 2 View  Result Date: 05/26/2017 CLINICAL DATA:  Cough and congestion for 1-1/2 weeks EXAM: CHEST  2 VIEW COMPARISON:  11/24/2016 FINDINGS: Left lower lobe suspected pneumonia. No pleural effusion. Stable borderline cardiomegaly. Aortic atherosclerosis. No pneumothorax. Degenerative changes of the spine. IMPRESSION: Suspected left lower lobe pneumonia. Imaging follow-up to resolution recommended. Electronically Signed   By: Donavan Foil M.D.   On:  05/26/2017 22:37    Pending Labs Unresulted Labs (From admission, onward)   Start     Ordered   05/28/17 0500  CBC  Tomorrow morning,   R     05/27/17 1055   05/28/17 4235  Basic metabolic panel  Tomorrow morning,   R     05/27/17 1055   05/27/17 0406  HIV antibody  Once,   R     05/27/17 0413   05/27/17 0406  Culture, sputum-assessment  Once,   R     05/27/17 0413   05/27/17 0406  Gram stain  Once,   R     05/27/17 0413   05/27/17 0406  Strep pneumoniae urinary antigen  Once,   R     05/27/17 0413      Vitals/Pain Today's Vitals   05/27/17 0600 05/27/17 0800 05/27/17 1000 05/27/17 1145  BP: 110/90 (!) 111/51 129/72 113/77  Pulse: (!) 59 (!) 57 60 (!) 57  Resp: _0 (!) 21  Temp:      TempSrc:      SpO2: 95% 94% 94% 96%  PainSc:        Isolation Precautions Droplet precaution  Medications Medications  cefTRIAXone (ROCEPHIN) 1 g in dextrose 5 % 50 mL IVPB (not administered)  azithromycin (ZITHROMAX) tablet 500 mg (not administered)  diazepam (VALIUM) tablet 2.5 mg (not administered)  guaiFENesin-dextromethorphan (ROBITUSSIN DM) 100-10 MG/5ML syrup 10 mL (10 mLs Oral Given 05/27/17 0510)  apixaban (ELIQUIS) tablet 2.5 mg (2.5 mg Oral Given 05/27/17 1143)  acetaminophen (TYLENOL) tablet 1,000 mg (1,000 mg Oral Given 05/27/17 0510)  0.9 %  sodium chloride infusion ( Intravenous New Bag/Given 05/27/17 1145)  multivitamin (PROSIGHT) tablet 1 tablet (1 tablet Oral Given 05/27/17 1143)  0.9 %  sodium chloride infusion ( Intravenous Stopped 05/27/17 0505)  sodium chloride 0.9 % bolus 500 mL (0 mLs Intravenous Stopped 05/27/17 0022)  ipratropium-albuterol (DUONEB) 0.5-2.5 (3) MG/3ML nebulizer solution 3 mL (3 mLs Nebulization Given 05/27/17 0003)  cefTRIAXone (ROCEPHIN) 1 g in dextrose 5 % 50 mL IVPB (0 g Intravenous Stopped 05/27/17 0022)  azithromycin (ZITHROMAX) 500 mg in dextrose 5 % 250 mL IVPB (0 mg Intravenous Stopped 05/27/17 0125)  aspirin chewable tablet 324 mg (324 mg Oral Given  05/27/17 0003)    Mobility Non-ambulatory

## 2017-05-27 NOTE — ED Notes (Signed)
Pt has been re-adjusted in the bed.  Given urinal to void but unable to at this time.  He has no complaints.  Given breakfast tray as he was asleep earlier.  Has access to call bell.

## 2017-05-27 NOTE — H&P (Signed)
History and Physical    Ralph Mendoza ZTI:[id number] DOB: [birth year] DOA: 05/26/2017  PCP: [name], MD  Patient coming from: ILF  I have personally briefly reviewed patient's old medical records in [place]  Chief Complaint: Cough  HPI: Ralph Mendoza is a 84 y.o. male with medical history significant of A.Fib on eliquis, CHF, HTN.  Still in independent living at baseline.  Patient presents to the ED with c/o feeling ill for past 1 week.  Patient has been having runny nose, congestion, persistent cough with clear sputum.  No fevers but is having chills and generalized weakness.  Weakness now at point where he is unable to function as well.   ED Course: Flu neg, CXR shows LLL PNA.  No WBC nor fever.  Trop 0.11.  Given rocephin and azithromycin.  Creat 1.59, basically at baseline.   Review of Systems: As per HPI otherwise 10 point review of systems negative.   Past Medical History:  Diagnosis Date  . Atrial fibrillation ([place])   . BPH (benign prostatic hyperplasia)   . Dyspnea on exertion    a. 09/2008 NL EF on myoview.  [name] Hearing difficulty    R sided hole in eardrum, only wears L hearing aid  . History of tobacco abuse   . Hypertension   . Lumbar stenosis with neurogenic claudication    a. 02/2011 s/p decompressive laminectomy.  . Midsternal chest pain    a. 09/2008 Myoview: EF 56%, mild fixed thinning of inf wall particularly @ base -? attenuation.  No ischemia  . PUD (peptic ulcer disease)    Remote, history of  . Urinary hesitancy     Past Surgical History:  Procedure Laterality Date  . HERNIA REPAIR     Left  . LUMBAR LAMINECTOMY/DECOMPRESSION MICRODISCECTOMY  03/04/2011   Procedure: LUMBAR LAMINECTOMY/DECOMPRESSION MICRODISCECTOMY;  Surgeon: Ophelia Charter;  Location: [place] NEURO ORS;  Service: Neurosurgery;  Laterality: N/A;  LUMBAR THREE-FOUR ,FOUR-FIVE LAMINECTOMIES  . TYMPANOMASTOIDECTOMY     Left modified radical with type 3 tympanoplasty     reports that he has quit smoking. he has never used smokeless tobacco. He reports that he does not drink alcohol or use drugs.  No Known Allergies  Family History  Problem Relation Age of Onset  . Lung cancer Mother   . Appendicitis Father   . Diabetes Sister        DM  . Coronary artery disease Neg Hx        No premature CAD  . Colon cancer Neg Hx      Prior to Admission medications   Medication Sig Start Date End Date Taking? Authorizing Provider  acetaminophen (TYLENOL) 500 MG tablet Take 1,000 mg by mouth every 6 (six) hours as needed for mild pain or moderate pain.   Yes [provider]  apixaban (ELIQUIS) 2.5 MG TABS tablet Take 1 tablet (2.5 mg total) by mouth 2 (two) times daily. 11/16/16  Yes Lelon Perla, MD  Dextromethorphan-Guaifenesin (DELSYM COUGH/CHEST CONGEST DM PO) Take 10 mLs by mouth daily as needed (for cough).    Yes [provider]  diazepam (VALIUM) 5 MG tablet Take 0.5 tablets (2.5 mg total) by mouth every 8 (eight) hours as needed for anxiety. 05/02/17  Yes Virgel Manifold, MD  furosemide (LASIX) 20 MG tablet Take 1 tablet (20 mg total) 2 (two) times daily by mouth. 02/24/17  Yes Isaiah Serge, NP  isosorbide dinitrate (ISORDIL) 30 MG tablet Take 1 tablet (  30 mg total) daily by mouth. 02/23/17  Yes Lelon Perla, MD  losartan (COZAAR) 25 MG tablet Take 1 tablet (25 mg total) by mouth daily. 03/16/17 06/14/17 Yes Lelon Perla, MD  Multiple Vitamins-Minerals (PRESERVISION AREDS 2+MULTI VIT) CAPS Take 1 capsule by mouth daily.   Yes [provider]    Physical Exam: Vitals:   05/27/17 0200 05/27/17 0230 05/27/17 0235 05/27/17 0400  BP: (!) 114/49 (!) 120/51 (!) 120/51 115/61  Pulse: (!) 110 67 64 (!) 52  Resp: 17 18 20 19   Temp:      TempSrc:      SpO2: 94% 92% 91% 93%    Constitutional: NAD, calm, comfortable Eyes: PERRL, lids and conjunctivae normal ENMT: Mucous membranes are moist. Posterior pharynx clear of any  exudate or lesions.Normal dentition.  Neck: normal, supple, no masses, no thyromegaly Respiratory: Wheezes Cardiovascular: IRR,IRR, rate in 60s Abdomen: no tenderness, no masses palpated. No hepatosplenomegaly. Bowel sounds positive.  Musculoskeletal: no clubbing / cyanosis. No joint deformity upper and lower extremities. Good ROM, no contractures. Normal muscle tone.  Skin: no rashes, lesions, ulcers. No induration Neurologic: CN 2-12 grossly intact. Sensation intact, DTR normal. Strength 5/5 in all 4.  Psychiatric: Normal judgment and insight. Alert and oriented x 3. Normal mood.    Labs on Admission: I have personally reviewed following labs and imaging studies  CBC: Recent Labs  Lab 05/26/17 2234  WBC 7.7  HGB 11.9*  HCT 34.4*  MCV 90.5  PLT 627*   Basic Metabolic Panel: Recent Labs  Lab 05/26/17 2234  NA 133*  K 3.4*  CL 95*  CO2 26  GLUCOSE 142*  BUN 29*  CREATININE 1.59*  CALCIUM 8.4*   GFR: CrCl cannot be calculated (Unknown ideal weight.). Liver Function Tests: No results for input(s): AST, ALT, ALKPHOS, BILITOT, PROT, ALBUMIN in the last 168 hours. No results for input(s): LIPASE, AMYLASE in the last 168 hours. No results for input(s): AMMONIA in the last 168 hours. Coagulation Profile: No results for input(s): INR, PROTIME in the last 168 hours. Cardiac Enzymes: No results for input(s): CKTOTAL, CKMB, CKMBINDEX, TROPONINI in the last 168 hours. BNP (last 3 results) No results for input(s): PROBNP in the last 8760 hours. HbA1C: No results for input(s): HGBA1C in the last 72 hours. CBG: No results for input(s): GLUCAP in the last 168 hours. Lipid Profile: No results for input(s): CHOL, HDL, LDLCALC, TRIG, CHOLHDL, LDLDIRECT in the last 72 hours. Thyroid Function Tests: No results for input(s): TSH, T4TOTAL, FREET4, T3FREE, THYROIDAB in the last 72 hours. Anemia Panel: No results for input(s): VITAMINB12, FOLATE, FERRITIN, TIBC, IRON, RETICCTPCT in the  last 72 hours. Urine analysis:    Component Value Date/Time   COLORURINE YELLOW 05/02/2017 1350   APPEARANCEUR CLEAR 05/02/2017 1350   LABSPEC 1.021 05/02/2017 1350   PHURINE 6.0 05/02/2017 1350   GLUCOSEU NEGATIVE 05/02/2017 1350   HGBUR NEGATIVE 05/02/2017 1350   BILIRUBINUR NEGATIVE 05/02/2017 1350   KETONESUR NEGATIVE 05/02/2017 1350   PROTEINUR 30 (A) 05/02/2017 1350   NITRITE NEGATIVE 05/02/2017 1350   LEUKOCYTESUR NEGATIVE 05/02/2017 1350    Radiological Exams on Admission: Dg Chest 2 View  Result Date: 05/26/2017 CLINICAL DATA:  Cough and congestion for 1-1/2 weeks EXAM: CHEST  2 VIEW COMPARISON:  11/24/2016 FINDINGS: Left lower lobe suspected pneumonia. No pleural effusion. Stable borderline cardiomegaly. Aortic atherosclerosis. No pneumothorax. Degenerative changes of the spine. IMPRESSION: Suspected left lower lobe pneumonia. Imaging follow-up to resolution recommended. Electronically Signed  By: Donavan Foil M.D.   On: 05/26/2017 22:37    EKG: Independently reviewed.  Assessment/Plan Principal Problem:   Community acquired pneumonia of left lower lobe of lung ([place]) Active Problems:   Essential hypertension   Atrial fibrillation (H[facility])   CKD (chronic kidney disease) stage 3, GFR 30-59 ml/min ([place])    1. CAP of LLL - 1. PNA pathway 2. Rocephin and azithro 3. Flu pcr neg 4. Sputum Cx 5. Will "gently hydrate" the patient by holding lasix, also got 500cc bolus inED. 2. HTN - 1. BP running on the low side 2. Will hold his lisinopril and imdur as well 3. A.Fib - 1. Continue eliquis 2. Looks like he has baseline slow ventricular response with HR in the 50s-60s here in the ED.  No rate control meds at baseline. 4. CKD stage 3 - 1. Essentially at baseline at this point.  DVT prophylaxis: Eliquis Code Status: Full Family Communication: No family in room Disposition Plan: Home after admit Consults called: None Admission status: IP status as his PORT score is  elevated   [place]ARDNER, [facility] Pager [phone]  If 7AM-7PM, please contact day team taking care of patient [email or web address] Password [facility]  05/27/2017, 4:54 AM

## 2017-05-27 NOTE — Progress Notes (Addendum)
  PROGRESS NOTE  JJ ENYEART XIH:[id number] DOB: [birth year] DOA: 05/26/2017 PCP: [name], MD  Brief Narrative: 72 yom PMH CKD stage III, systolic CHF, afib presented with 1 week illness, URI symptoms, generalized weakness. Admitted for lobar pneumonia.  Assessment/Plan LLL lobar pneumonia. Flu negative. - no fever, hypoxia. WBC WNL. But appears SOB, pale and weak - continue empiric abx  Elevated lactic acid, minimal - did not meet SIRS criteria. Hemodynamics stable. No further evaluation planned.  Elevated troponin. - trending down, .11 >> .08. No evidence of ACS. Asymptomatic. Trivial elevation in context of CKD, chronic CHF and infection. No further evaluation planned.  Thrombocytopenia - secondary to acute infection. Check CBC in AM.  Dehydration - gentle IVF  Chronic systolic CHF LVEF 34-91% by echo 2/017 - appears compensated  Chronic prolonged QTc - avoid QTC-prolonging agents  CKD stage III - at baseline  Afib - stable. Continue apixaban. Not on rate control agent   PT consult  DVT prophylaxis: apixaban Code Status: full Family Communication: none Disposition Plan: return to ILF most likely    Murray Hodgkins, MD  [facility] Direct contact: [phone] --[place]  --[email or web address]; password [email or web address]  7PM-7AM contact night coverage as above 05/27/2017, 10:57 AM  LOS: 0 days   Consultants:    Procedures:    Antimicrobials:  Ceftriaxone 2/7 >>  Azithromycin 2/7 >>  Interval history/Subjective: Feels weak, dry. Coughing.  Objective: Vitals:  Vitals:   05/27/17 0600 05/27/17 0800  BP: 110/90 (!) 111/51  Pulse: (!) 59 (!) 57  Resp: 18 19  Temp:    SpO2: 95% 94%    Exam:  Constitutional:  . Appears calm, uncomfortable, weak, pale but non-toxic ENMT:  . Hard of hearing Respiratory:  . CTA bilaterally, no w/r/r.  . Respiratory effort mildly increased Cardiovascular:  . RRR, no m/r/g . No LE extremity edema     Psychiatric:  . Mental status o Mood, affect appropriate . judgement and insight appear intact   I have personally reviewed the following:   Labs:  Troponin .11 >>.08  Lactate was 2.04  Creatinine 1.59  plts 142  Imaging studies:  CXR LLL infiltrate  Medical tests:  EKG afib, prolonged QT  Scheduled Meds: . apixaban  2.5 mg Oral BID  . azithromycin  500 mg Oral Q24H  . PRESERVISION AREDS 2+MULTI VIT  1 capsule Oral Daily   Continuous Infusions: . sodium chloride    . cefTRIAXone (ROCEPHIN)  IV      Principal Problem:   Lobar pneumonia (HCC) Active Problems:   Atrial fibrillation (HCC)   CKD (chronic kidney disease) stage 3, GFR 30-59 ml/min (HCC)   Thrombocytopenia (HCC)   Dehydration   Chronic systolic CHF (congestive heart failure) (HCC)   LOS: 0 days    Time 10:00-10:40, review of chart, discussion with patient at bedside of pneumonia, dehydration and treatment plan.

## 2017-05-27 NOTE — ED Notes (Signed)
I Stat Lactic: 2.04 RN and Provider notified

## 2017-05-27 NOTE — ED Notes (Signed)
Pt is aware that we need a urine sample, urinal placed at the bedside.

## 2017-05-27 NOTE — ED Notes (Signed)
Discussed with Dr. Alcario Drought that the patient had already rec'd abx earlier, okay to d/c blood cultures at this time.

## 2017-05-27 NOTE — ED Notes (Signed)
Admitting provider at the patient bedside.

## 2017-05-27 NOTE — ED Notes (Signed)
Date and time results received: 05/27/17 0611 (use smartphrase ".now" to insert current time)  Test: troponin Critical Value: 0.08  Name of Provider Notified: nanivanti  Orders Received? Or Actions Taken?: Orders Received - See Orders for details

## 2017-05-27 NOTE — ED Provider Notes (Addendum)
[facility] DEPT Provider Note   CSN: [id number] Arrival date & time: 05/26/17  1952     History   Chief Complaint Chief Complaint  Patient presents with  . Cough  . Nasal Congestion    HPI Ralph Mendoza is a 84 y.o. male.  HPI  84 year old male with history of A. fib on Eliquis, CHF and hypertension comes in with chief complaint of cough, congestion, congestion.  Patient states that he has been sick for the last 1 week.  Patient has been having runny nose, congestion, persistent cough with clear phlegm.  Patient denies any fevers, but is having chills and generalized weakness.  Patient's weakness now is at a point where he is unable to function as well.  Patient lives at an independent living center by himself.   Past Medical History:  Diagnosis Date  . Atrial fibrillation ([place])   . BPH (benign prostatic hyperplasia)   . Dyspnea on exertion    a. 09/2008 NL EF on myoview.  [name] Hearing difficulty    R sided hole in eardrum, only wears L hearing aid  . History of tobacco abuse   . Hypertension   . Lumbar stenosis with neurogenic claudication    a. 02/2011 s/p decompressive laminectomy.  . Midsternal chest pain    a. 09/2008 Myoview: EF 56%, mild fixed thinning of inf wall particularly @ base -? attenuation.  No ischemia  . PUD (peptic ulcer disease)    Remote, history of  . Urinary hesitancy     Patient Active Problem List   Diagnosis Date Noted  . Recurrent right pleural effusion 09/27/2016  . CKD (chronic kidney disease) stage 3, GFR 30-59 ml/min (HCC) 09/27/2016  . Anemia of chronic disease 09/27/2016  . Atrial fibrillation ([place]) 05/17/2012  . Essential hypertension 09/10/2008    Past Surgical History:  Procedure Laterality Date  . HERNIA REPAIR     Left  . LUMBAR LAMINECTOMY/DECOMPRESSION MICRODISCECTOMY  03/04/2011   Procedure: LUMBAR LAMINECTOMY/DECOMPRESSION MICRODISCECTOMY;  Surgeon: Ophelia Charter;  Location: [facility] NEURO ORS;   Service: Neurosurgery;  Laterality: N/A;  LUMBAR THREE-FOUR ,FOUR-FIVE LAMINECTOMIES  . TYMPANOMASTOIDECTOMY     Left modified radical with type 3 tympanoplasty       Home Medications    Prior to Admission medications   Medication Sig Start Date End Date Taking? Authorizing Provider  acetaminophen (TYLENOL) 500 MG tablet Take 1,000 mg by mouth every 6 (six) hours as needed for mild pain or moderate pain.   Yes [provider]  apixaban (ELIQUIS) 2.5 MG TABS tablet Take 1 tablet (2.5 mg total) by mouth 2 (two) times daily. 11/16/16  Yes Lelon Perla, MD  Dextromethorphan-Guaifenesin (DELSYM COUGH/CHEST CONGEST DM PO) Take 10 mLs by mouth daily as needed (for cough).    Yes [provider]  diazepam (VALIUM) 5 MG tablet Take 0.5 tablets (2.5 mg total) by mouth every 8 (eight) hours as needed for anxiety. 05/02/17  Yes Virgel Manifold, MD  furosemide (LASIX) 20 MG tablet Take 1 tablet (20 mg total) 2 (two) times daily by mouth. 02/24/17  Yes Isaiah Serge, NP  isosorbide dinitrate (ISORDIL) 30 MG tablet Take 1 tablet (30 mg total) daily by mouth. 02/23/17  Yes Lelon Perla, MD  losartan (COZAAR) 25 MG tablet Take 1 tablet (25 mg total) by mouth daily. 03/16/17 06/14/17 Yes Lelon Perla, MD  Multiple Vitamins-Minerals (PRESERVISION AREDS 2+MULTI VIT) CAPS Take 1 capsule by mouth daily.   Yes  [provider]  oxyCODONE-acetaminophen (PERCOCET/ROXICET) 5-325 MG tablet Take 1 tablet by mouth every 4 (four) hours as needed for severe pain. Patient not taking: Reported on 05/27/2017 05/02/17   Virgel Manifold, MD    Family History Family History  Problem Relation Age of Onset  . Lung cancer Mother   . Appendicitis Father   . Diabetes Sister        DM  . Coronary artery disease Neg Hx        No premature CAD  . Colon cancer Neg Hx     Social History Social History   Tobacco Use  . Smoking status: Former Research scientist (life sciences)  . Smokeless tobacco: Never Used  .  Tobacco comment: Remote history of smoking  Substance Use Topics  . Alcohol use: No  . Drug use: No     Allergies   Patient has no known allergies.   Review of Systems Review of Systems  Constitutional: Positive for activity change.  HENT: Positive for congestion and rhinorrhea.   Respiratory: Positive for shortness of breath.   Allergic/Immunologic: Negative for immunocompromised state.  Hematological: Bruises/bleeds easily.  All other systems reviewed and are negative.    Physical Exam Updated Vital Signs BP 108/60   Pulse 69   Temp 98 F (36.7 C) (Oral)   Resp 20   SpO2 97%   Physical Exam  Constitutional: He is oriented to person, place, and time. He appears well-developed.  HENT:  Head: Atraumatic.  Eyes: EOM are normal.  Neck: Neck supple.  Cardiovascular: Normal rate.  Pulmonary/Chest: Effort normal. No respiratory distress. He has wheezes. He has no rales.  Abdominal: Soft.  Musculoskeletal: He exhibits no edema.  Neurological: He is alert and oriented to person, place, and time.  Skin: Skin is warm.  Nursing note and vitals reviewed.    ED Treatments / Results  Labs (all labs ordered are listed, but only abnormal results are displayed) Labs Reviewed  BASIC METABOLIC PANEL - Abnormal; Notable for the following components:      Result Value   Sodium 133 (*)    Potassium 3.4 (*)    Chloride 95 (*)    Glucose, Bld 142 (*)    BUN 29 (*)    Creatinine, Ser 1.59 (*)    Calcium 8.4 (*)    GFR calc non Af Amer 35 (*)    GFR calc Af Amer 41 (*)    All other components within normal limits  CBC - Abnormal; Notable for the following components:   RBC 3.80 (*)    Hemoglobin 11.9 (*)    HCT 34.4 (*)    Platelets 142 (*)    All other components within normal limits  I-STAT TROPONIN, ED - Abnormal; Notable for the following components:   Troponin i, poc 0.11 (*)    All other components within normal limits  I-STAT CG4 LACTIC ACID, ED - Abnormal;  Notable for the following components:   Lactic Acid, Venous 2.04 (*)    All other components within normal limits  INFLUENZA PANEL BY PCR (TYPE A & B)  BRAIN NATRIURETIC PEPTIDE    EKG  EKG Interpretation  Date/Time:  Thursday May 27 2017 00:30:04 EST Ventricular Rate:  74 PR Interval:    QRS Duration: 103 QT Interval:  512 QTC Calculation: 569 R Axis:   -89 Text Interpretation:  Atrial fibrillation Incomplete RBBB and LAFB Abnormal R-wave progression, late transition Nonspecific T abnormalities, lateral leads Prolonged QT interval No acute changes Nonspecific  ST and T wave abnormality Confirmed by Varney Biles [phone]) on 05/27/2017 12:43:22 AM       Radiology Dg Chest 2 View  Result Date: 05/26/2017 CLINICAL DATA:  Cough and congestion for 1-1/2 weeks EXAM: CHEST  2 VIEW COMPARISON:  11/24/2016 FINDINGS: Left lower lobe suspected pneumonia. No pleural effusion. Stable borderline cardiomegaly. Aortic atherosclerosis. No pneumothorax. Degenerative changes of the spine. IMPRESSION: Suspected left lower lobe pneumonia. Imaging follow-up to resolution recommended. Electronically Signed   By: Donavan Foil M.D.   On: 05/26/2017 22:37    Procedures Procedures (including critical care time)  CRITICAL CARE Performed by: Raevin Wierenga   Total critical care time: 33 minutes  Critical care time was exclusive of separately billable procedures and treating other patients.  Critical care was necessary to treat or prevent imminent or life-threatening deterioration.  Critical care was time spent personally by me on the following activities: development of treatment plan with patient and/or surrogate as well as nursing, discussions with consultants, evaluation of patient's response to treatment, examination of patient, obtaining history from patient or surrogate, ordering and performing treatments and interventions, ordering and review of laboratory studies, ordering and review of  radiographic studies, pulse oximetry and re-evaluation of patient's condition.   Medications Ordered in ED Medications  sodium chloride 0.9 % bolus 500 mL (500 mLs Intravenous New Bag/Given 05/26/17 2354)  cefTRIAXone (ROCEPHIN) 1 g in dextrose 5 % 50 mL IVPB (1 g Intravenous New Bag/Given 05/26/17 2354)  azithromycin (ZITHROMAX) 500 mg in dextrose 5 % 250 mL IVPB (500 mg Intravenous New Bag/Given 05/27/17 0007)  0.9 %  sodium chloride infusion ( Intravenous New Bag/Given 05/27/17 0004)  ipratropium-albuterol (DUONEB) 0.5-2.5 (3) MG/3ML nebulizer solution 3 mL (3 mLs Nebulization Given 05/27/17 0003)  aspirin chewable tablet 324 mg (324 mg Oral Given 05/27/17 0003)     Initial Impression / Assessment and Plan / ED Course  I have reviewed the triage vital signs and the nursing notes.  Pertinent labs & imaging results that were available during my care of the patient were reviewed by me and considered in my medical decision making (see chart for details).     84 year old comes in with chief complaint of URI like symptoms.  Clinical suspicion is high for influenza or viral infection.  Patient on exam has diffuse wheezing.  Chest x-ray does show some concerns for possible left-sided infiltrate.  Patient does not have elevated white count or fever, but he is high risk for flu related complications, given his age and known history of CHF.  Troponin is noted to be 0.11.  EKG is pending.  Patient does not have a typical chest pain. There could be a small component of CHF as well. No diuresing for now.  Final Clinical Impressions(s) / ED Diagnoses   Final diagnoses:  Elevated troponin  Community acquired pneumonia of left lower lobe of lung ([place])  Viral URI with cough    ED Discharge Orders    None        Varney Biles, MD 05/27/17 [place], Yonah, MD 05/27/17 [phone]

## 2017-05-28 ENCOUNTER — Inpatient Hospital Stay (HOSPITAL_COMMUNITY): Payer: Medicare Other

## 2017-05-28 LAB — BASIC METABOLIC PANEL
Anion gap: 10 (ref 5–15)
BUN: 24 mg/dL — ABNORMAL HIGH (ref 6–20)
CALCIUM: 8 mg/dL — AB (ref 8.9–10.3)
CO2: 23 mmol/L (ref 22–32)
CREATININE: 1.47 mg/dL — AB (ref 0.61–1.24)
Chloride: 100 mmol/L — ABNORMAL LOW (ref 101–111)
GFR calc Af Amer: 45 mL/min — ABNORMAL LOW (ref >60)
GFR, EST NON AFRICAN AMERICAN: 39 mL/min — AB (ref >60)
Glucose, Bld: 111 mg/dL — ABNORMAL HIGH (ref 65–99)
Potassium: 3.5 mmol/L (ref 3.5–5.1)
SODIUM: 133 mmol/L — AB (ref 135–145)

## 2017-05-28 LAB — CBC
HEMATOCRIT: 30.7 % — AB (ref 39.0–52.0)
Hemoglobin: 10.6 g/dL — ABNORMAL LOW (ref 13.0–17.0)
MCH: 30.9 pg (ref 26.0–34.0)
MCHC: 34.5 g/dL (ref 30.0–36.0)
MCV: 89.5 fL (ref 78.0–100.0)
PLATELETS: 131 10*3/uL — AB (ref 150–400)
RBC: 3.43 MIL/uL — ABNORMAL LOW (ref 4.22–5.81)
RDW: 13.9 % (ref 11.5–15.5)
WBC: 5.9 10*3/uL (ref 4.0–10.5)

## 2017-05-28 LAB — LACTIC ACID, PLASMA: Lactic Acid, Venous: 1.3 mmol/L (ref 0.5–1.9)

## 2017-05-28 MED ORDER — GUAIFENESIN-DM 100-10 MG/5ML PO SYRP
10.0000 mL | ORAL_SOLUTION | Freq: Four times a day (QID) | ORAL | Status: DC
  Administered 2017-05-28 – 2017-05-30 (×6): 10 mL via ORAL
  Filled 2017-05-28 (×7): qty 10

## 2017-05-28 MED ORDER — GUAIFENESIN-DM 100-10 MG/5ML PO SYRP: 5.0000 mL | ORAL_SOLUTION | Freq: Four times a day (QID) | ORAL | Status: DC

## 2017-05-28 NOTE — Progress Notes (Signed)
PT Cancellation Note  Patient Details Name: Ralph Mendoza MRN: [id number] DOB: 1921-03-10   Cancelled Treatment:    Reason Eval/Treat Not Completed: Fatigue/lethargy limiting ability to participate Pt reports feeling very tired and dosing off frequently.  Pt felt unable to participate today due to not feeling well.  Will check back as schedule permits.   Okey Zelek,KATHrine E 05/28/2017, 11:00 AM Carmelia Bake, PT, DPT 05/28/2017 Pager: [phone]

## 2017-05-28 NOTE — Progress Notes (Signed)
PROGRESS NOTE    Patient: Ralph Mendoza     PCP: [name], MD                    DOB: December 26, 1920            DOA: 05/26/2017 ACZ:[id number]             DOS: 05/28/2017, 1:06 PM   LOS: 1 day   Date of Service: The patient was seen and examined on 05/28/2017  Subjective:    Patient was seen and examined this morning, lethargic, but able to be aroused, answers some questions.  No labored breathing, denies any chest pain, complaining of cough, improved shortness of breath. Per nursing staff and patient cough is persistent still nonproductive. No major issues overnight.  ----------------------------------------------------------------------------------------------------------------------  Brief Narrative:  Ralph Mendoza, is a pleasant 84 year old male with extensive history of CKD stage III, systolic congestive heart failure, atrial fibrillation who presented with complaint of upper respiratory symptoms progressively getting worse over the past week.  Was admitted for left lower lobe pneumonia, on initial screening patient was rule out for sepsis/Sirs, influenza was negative.     Principal Problem:   Lobar pneumonia ([place]) Active Problems:   Atrial fibrillation (HCC)   CKD (chronic kidney disease) stage 3, GFR 30-59 ml/min (HCC)   Thrombocytopenia (HCC)   Dehydration   Chronic systolic CHF (congestive heart failure) (HCC)   Assessment & Plan:     LLL lobar pneumonia.  Still lethargic, possibly secondary to infection, sepsis/Sirs ruled out, influenza negative, currently afebrile normotensive, improving shortness of breath, still pale looking weak lethargic. We will continue IV antibiotics of Rocephin and azithromycin Flu negative.  Elevated lactic acid, minimal Repeating lactic acid, gentle IV fluid hydration - did not meet SIRS criteria. Hemodynamics stable. No further evaluation planned.  Elevated troponin. - trending down, .11 >> .08. No evidence of ACS.  Asymptomatic. Trivial elevation in context of CKD, chronic CHF and infection. No further evaluation planned.  Thrombocytopenia - secondary to acute infection. Check CBC in AM.  Dehydration - gentle IVF  Chronic systolic CHF LVEF 10-93% by echo 2/017 - appears compensated  Chronic prolonged QTc - avoid QTC-prolonging agents  CKD stage III - at baseline  Afib - stable. Continue apixaban. Not on rate control agent   PT consult  DVT prophylaxis: apixaban Code Status: full Family Communication: none Disposition Plan: return to ILF most likely in 1-2 days, Once stable reevaluating with PT   Procedures:  No admission procedures for hospital encounter.   Antimicrobials:  Anti-infectives (From admission, onward)   Start     Dose/Rate Route Frequency Ordered Stop   05/27/17 2200  cefTRIAXone (ROCEPHIN) 1 g in dextrose 5 % 50 mL IVPB     1 g 100 mL/hr over 30 Minutes Intravenous Every 24 hours 05/27/17 0413 06/03/17 2159   05/27/17 2200  azithromycin (ZITHROMAX) tablet 500 mg     500 mg Oral Every 24 hours 05/27/17 0413 06/03/17 2159   05/26/17 2330  cefTRIAXone (ROCEPHIN) 1 g in dextrose 5 % 50 mL IVPB     1 g 100 mL/hr over 30 Minutes Intravenous  Once 05/26/17 2325 05/27/17 0022   05/26/17 2330  azithromycin (ZITHROMAX) 500 mg in dextrose 5 % 250 mL IVPB     500 mg 250 mL/hr over 60 Minutes Intravenous  Once 05/26/17 2325 05/27/17 0125     Objective: Vitals:   05/27/17 1400 05/27/17 1519 05/27/17 2304 05/28/17 2355  BP: 128/72 128/74 129/74 131/64  Pulse: 61 62 70 72  Resp: (!) 21 18 20 20   Temp:  98.1 F (36.7 C) 99.7 F (37.6 C) 98.6 F (37 C)  TempSrc:  Oral Oral Oral  SpO2: 98% 98% 96% 93%  Weight:  80.9 kg (178 lb 5.6 oz)    Height:  5\' 10"  (1.778 m)      Intake/Output Summary (Last 24 hours) at 05/28/2017 1306 Last data filed at 05/28/2017 0900 Gross per 24 hour  Intake 1808.75 ml  Output 500 ml  Net 1308.75 ml   Filed Weights   05/27/17  1519  Weight: 80.9 kg (178 lb 5.6 oz)    Examination:  General exam: Lethargic but arousable, C/o cough  Psychiatry: Judgement and insight appear normal. Mood & affect appropriate. HEENT: WNLs Respiratory system: Clear to auscultation. Respiratory effort normal. Cardiovascular system: S1 & S2 heard, RRR. No JVD, murmurs, rubs, gallops or clicks. No pedal edema. Gastrointestinal system: Abd. nondistended, soft and nontender. No organomegaly or masses felt. Normal bowel sounds heard. Central nervous system: Alert and oriented. No focal neurological deficits. Extremities: Symmetric 5 x 5 power. Skin: No rashes, lesions or ulcers Wounds:   Data Reviewed: I have personally reviewed following labs and imaging studies  CBC: Recent Labs  Lab 05/26/17 2234 05/28/17 0415  WBC 7.7 5.9  HGB 11.9* 10.6*  HCT 34.4* 30.7*  MCV 90.5 89.5  PLT 142* 423*   Basic Metabolic Panel: Recent Labs  Lab 05/26/17 2234 05/28/17 0415  NA 133* 133*  K 3.4* 3.5  CL 95* 100*  CO2 26 23  GLUCOSE 142* 111*  BUN 29* 24*  CREATININE 1.59* 1.47*  CALCIUM 8.4* 8.0*   GCardiac Enzymes: Recent Labs  Lab 05/27/17 0509  TROPONINI 0.08*   B Sepsis Labs: Recent Labs  Lab 05/27/17 0003  LATICACIDVEN 2.04*    No results found for this or any previous visit (from the past 240 hour(s)).    Radiology Studies: Dg Chest 2 View  Result Date: 05/26/2017 CLINICAL DATA:  Cough and congestion for 1-1/2 weeks EXAM: CHEST  2 VIEW COMPARISON:  11/24/2016 FINDINGS: Left lower lobe suspected pneumonia. No pleural effusion. Stable borderline cardiomegaly. Aortic atherosclerosis. No pneumothorax. Degenerative changes of the spine. IMPRESSION: Suspected left lower lobe pneumonia. Imaging follow-up to resolution recommended. Electronically Signed   By: Donavan Foil M.D.   On: 05/26/2017 22:37    Scheduled Meds: . apixaban  2.5 mg Oral BID  . azithromycin  500 mg Oral Q24H  . guaiFENesin-dextromethorphan  10 mL  Oral Q6H  . multivitamin  1 tablet Oral Daily   Continuous Infusions: . cefTRIAXone (ROCEPHIN)  IV Stopped (05/27/17 2129)    Time spent: >25 minutes  Deatra James, MD [facility],  Pager [phone]  If 7PM-7AM, please contact night-coverage [email or web address]   Password [email or web address]  05/28/2017, 1:06 PM

## 2017-05-29 DIAGNOSIS — I482 Chronic atrial fibrillation: Secondary | ICD-10-CM

## 2017-05-29 DIAGNOSIS — I5022 Chronic systolic (congestive) heart failure: Secondary | ICD-10-CM

## 2017-05-29 DIAGNOSIS — D696 Thrombocytopenia, unspecified: Secondary | ICD-10-CM

## 2017-05-29 DIAGNOSIS — J181 Lobar pneumonia, unspecified organism: Principal | ICD-10-CM

## 2017-05-29 DIAGNOSIS — R748 Abnormal levels of other serum enzymes: Secondary | ICD-10-CM

## 2017-05-29 DIAGNOSIS — N183 Chronic kidney disease, stage 3 (moderate): Secondary | ICD-10-CM

## 2017-05-29 MED ORDER — ISOSORBIDE DINITRATE 20 MG PO TABS
30.0000 mg | ORAL_TABLET | Freq: Every day | ORAL | Status: DC
  Administered 2017-05-30: 30 mg via ORAL
  Filled 2017-05-29: qty 1

## 2017-05-29 NOTE — Progress Notes (Signed)
Patient ID: Ralph Mendoza, male   DOB: April 26, 1920, 84 y.o.   MRN: [id number]  PROGRESS NOTE    Ralph Mendoza  TAV:[id number] DOB: December 31, 1920 DOA: 05/26/2017 PCP: [name], MD   Brief Narrative:  84 year old male with history of chronic kidney disease stage III, chronic systolic CHF, atrial fibrillation on Eliquis and hypertension presented with cough.  He was admitted with left lower lobe pneumonia and started on antibiotics.   Assessment & Plan:   Principal Problem:   Lobar pneumonia ([place]) Active Problems:   Atrial fibrillation (HCC)   CKD (chronic kidney disease) stage 3, GFR 30-59 ml/min (HCC)   Thrombocytopenia (HCC)   Dehydration   Chronic systolic CHF (congestive heart failure) (HCC)   Left lower  lobar pneumonia.  -Continue Rocephin and Zithromax.  Cultures negative so far.   -Will request SLP evaluation -Currently on room air.  Repeat chest x-ray for tomorrow  Elevated troponin. - Trivial elevation in context of CKD, chronic CHF and pneumonia.  -No chest pain. -We will get 2D echo.  Thrombocytopenia - secondary to acute infection. Check CBC in AM.  Dehydration -Treated with IV fluid.  Chronic systolic CHF LVEF 55-37% by echo 2/017 - appears compensated.  -2D echo.  Strict input and output.  Daily weights.  Restart isosorbide.  Losartan can be restarted on discharge if blood pressure remains stable -Outpatient follow-up with cardiology  Chronic prolonged QTc - avoid QTC-prolonging agents  CKD stage III - at baseline.  Repeat a.m. Creatinine  Chronic Afib -stable. Continue apixaban. Not on rate control agent   DVT prophylaxis: Eliquis Code Status: Full Family Communication: None at bedside Disposition Plan: Independent living facility versus nursing home in 1-2 days  Consultants: None  Procedures: None  Antimicrobials:  Anti-infectives (From admission, onward)   Start     Dose/Rate Route Frequency Ordered Stop   05/27/17 2200   cefTRIAXone (ROCEPHIN) 1 g in dextrose 5 % 50 mL IVPB     1 g 100 mL/hr over 30 Minutes Intravenous Every 24 hours 05/27/17 0413 06/03/17 2159   05/27/17 2200  azithromycin (ZITHROMAX) tablet 500 mg     500 mg Oral Every 24 hours 05/27/17 0413 06/03/17 2159   05/26/17 2330  cefTRIAXone (ROCEPHIN) 1 g in dextrose 5 % 50 mL IVPB     1 g 100 mL/hr over 30 Minutes Intravenous  Once 05/26/17 2325 05/27/17 0022   05/26/17 2330  azithromycin (ZITHROMAX) 500 mg in dextrose 5 % 250 mL IVPB     500 mg 250 mL/hr over 60 Minutes Intravenous  Once 05/26/17 2325 05/27/17 0125        Subjective: Patient seen and examined at bedside.  He is very hard of hearing.  He still feels very weak and tired.  He still has intermittent cough.  No overnight fever or vomiting  Objective: Vitals:   05/28/17 0554 05/28/17 1352 05/28/17 2133 05/29/17 0503  BP: 131/64 114/70 (!) 130/58 (!) 127/46  Pulse: 72 60 (!) 56 (!) 56  Resp: 20 20 20 19   Temp: 98.6 F (37 C) 98.4 F (36.9 C) 98.1 F (36.7 C) 98.5 F (36.9 C)  TempSrc: Oral Oral Oral Oral  SpO2: 93% 99% 97% 93%  Weight:      Height:        Intake/Output Summary (Last 24 hours) at 05/29/2017 1320 Last data filed at 05/29/2017 0600 Gross per 24 hour  Intake 470 ml  Output 275 ml  Net 195 ml   Filed  Weights   05/27/17 1519  Weight: 80.9 kg (178 lb 5.6 oz)    Examination:  General exam: Elderly male lying in bed.  Very hard of hearing.  No acute distress. Respiratory system: Bilateral decreased breath sound at bases with some scattered crackles Cardiovascular system: S1 & S2 heard, mild intermittent bradycardia  gastrointestinal system: Abdomen is nondistended, soft and nontender. Normal bowel sounds heard. Extremities: No cyanosis, clubbing; trace edema   Data Reviewed: I have personally reviewed following labs and imaging studies  CBC: Recent Labs  Lab 05/26/17 2234 05/28/17 0415  WBC 7.7 5.9  HGB 11.9* 10.6*  HCT 34.4* 30.7*  MCV  90.5 89.5  PLT 142* 502*   Basic Metabolic Panel: Recent Labs  Lab 05/26/17 2234 05/28/17 0415  NA 133* 133*  K 3.4* 3.5  CL 95* 100*  CO2 26 23  GLUCOSE 142* 111*  BUN 29* 24*  CREATININE 1.59* 1.47*  CALCIUM 8.4* 8.0*   GFR: Estimated Creatinine Clearance: 31 mL/min (A) (by C-G formula based on SCr of 1.47 mg/dL (H)). Liver Function Tests: No results for input(s): AST, ALT, ALKPHOS, BILITOT, PROT, ALBUMIN in the last 168 hours. No results for input(s): LIPASE, AMYLASE in the last 168 hours. No results for input(s): AMMONIA in the last 168 hours. Coagulation Profile: No results for input(s): INR, PROTIME in the last 168 hours. Cardiac Enzymes: Recent Labs  Lab 05/27/17 0509  TROPONINI 0.08*   BNP (last 3 results) No results for input(s): PROBNP in the last 8760 hours. HbA1C: No results for input(s): HGBA1C in the last 72 hours. CBG: No results for input(s): GLUCAP in the last 168 hours. Lipid Profile: No results for input(s): CHOL, HDL, LDLCALC, TRIG, CHOLHDL, LDLDIRECT in the last 72 hours. Thyroid Function Tests: No results for input(s): TSH, T4TOTAL, FREET4, T3FREE, THYROIDAB in the last 72 hours. Anemia Panel: No results for input(s): VITAMINB12, FOLATE, FERRITIN, TIBC, IRON, RETICCTPCT in the last 72 hours. Sepsis Labs: Recent Labs  Lab 05/27/17 0003 05/28/17 1315  LATICACIDVEN 2.04* 1.3    No results found for this or any previous visit (from the past 240 hour(s)).       Radiology Studies: Dg Chest 2 View  Result Date: 05/28/2017 CLINICAL DATA:  Cough.  Renal failure.  Hypertension. EXAM: CHEST  2 VIEW COMPARISON:  May 26, 2017 FINDINGS: There is persistent patchy infiltrate in the posterior left base, somewhat better seen on lateral view. Lungs elsewhere clear. Heart is upper normal in size with pulmonary vascularity within normal limits. There is aortic atherosclerosis. No adenopathy. There is degenerative change in the thoracic spine.  IMPRESSION: Persistent posterior left base infiltrate consistent with pneumonia. Lungs elsewhere clear. Stable cardiac silhouette. There is aortic atherosclerosis. Aortic Atherosclerosis (ICD10-I70.0). Followup PA and lateral chest radiographs recommended in 3-4 weeks following trial of antibiotic therapy to ensure resolution and exclude underlying malignancy. Electronically Signed   By: Lowella Grip III M.D.   On: 05/28/2017 15:28        Scheduled Meds: . apixaban  2.5 mg Oral BID  . azithromycin  500 mg Oral Q24H  . guaiFENesin-dextromethorphan  10 mL Oral Q6H  . multivitamin  1 tablet Oral Daily   Continuous Infusions: . cefTRIAXone (ROCEPHIN)  IV Stopped (05/28/17 2250)     LOS: 2 days        Aline August, MD [facility] Pager [phone]  If 7PM-7AM, please contact night-coverage [email or web address] Password [email or web address] 05/29/2017, 1:20 PM

## 2017-05-29 NOTE — Evaluation (Signed)
Physical Therapy Evaluation Patient Details Name: Ralph Mendoza MRN: [id number] DOB: 09/06/1920 Today's Date: 05/29/2017   History of Present Illness  Pt admitted from [facility] living 2* SOB and dx with PNA  Clinical Impression  Pt admitted as above and presents with functional mobility limitations 2* generalized weakness and mild ambulatory balance deficits.  Pt should progress to dc home to IND living facility with follow up HHPT.    Follow Up Recommendations Home health PT    Equipment Recommendations  None recommended by PT    Recommendations for Other Services OT consult     Precautions / Restrictions Precautions Precautions: Fall Restrictions Weight Bearing Restrictions: No      Mobility  Bed Mobility Overal bed mobility: Needs Assistance Bed Mobility: Supine to Sit     Supine to sit: Supervision     General bed mobility comments: Increased time and use of bed rail but no physical assist  Transfers Overall transfer level: Needs assistance Equipment used: Rolling walker (2 wheeled) Transfers: Sit to/from Stand Sit to Stand: Min guard         General transfer comment: cues for safe transition position and use of UEs to self assist  Ambulation/Gait Ambulation/Gait assistance: Min guard Ambulation Distance (Feet): 200 Feet Assistive device: Rolling walker (2 wheeled) Gait Pattern/deviations: Step-through pattern;Decreased step length - right;Decreased step length - left;Shuffle;Trunk flexed Gait velocity: decr Gait velocity interpretation: Below normal speed for age/gender General Gait Details: cues for posture, pace and position from [employer]            Wheelchair Mobility    Modified Rankin (Stroke Patients Only)       Balance Overall balance assessment: Needs assistance Sitting-balance support: No upper extremity supported;Feet supported Sitting balance-Leahy Scale: Good     Standing balance support: Bilateral upper extremity  supported Standing balance-Leahy Scale: Poor                               Pertinent Vitals/Pain Pain Assessment: No/denies pain    Home Living Family/patient expects to be discharged to:: Other (Comment)                 Additional Comments: [facility]    Prior Function Level of Independence: Independent with assistive device(s)         Comments: Pt utilizes cane and RW as needed     Hand Dominance        Extremity/Trunk Assessment   Upper Extremity Assessment Upper Extremity Assessment: Generalized weakness    Lower Extremity Assessment Lower Extremity Assessment: Generalized weakness    Cervical / Trunk Assessment Cervical / Trunk Assessment: Kyphotic  Communication   Communication: HOH  Cognition Arousal/Alertness: Awake/alert Behavior During Therapy: WFL for tasks assessed/performed Overall Cognitive Status: Within Functional Limits for tasks assessed                                        General Comments      Exercises     Assessment/Plan    PT Assessment Patient needs continued PT services  PT Problem List Decreased strength;Decreased activity tolerance;Decreased balance;Decreased mobility;Decreased knowledge of use of DME       PT Treatment Interventions DME instruction;Gait training;Functional mobility training;Therapeutic activities;Therapeutic exercise;Patient/family education    PT Goals (Current goals can be found in the Care Plan section)  Acute Rehab PT Goals Patient Stated Goal: Regain IND and return home PT Goal Formulation: With patient Time For Goal Achievement: 06/12/17 Potential to Achieve Goals: Fair    Frequency Min 3X/week   Barriers to discharge        Co-evaluation               AM-PAC PT "6 Clicks" Daily Activity  Outcome Measure Difficulty turning over in bed (including adjusting bedclothes, sheets and blankets)?: A Little Difficulty moving from lying on back to sitting  on the side of the bed? : A Lot Difficulty sitting down on and standing up from a chair with arms (e.g., wheelchair, bedside commode, etc,.)?: A Lot Help needed moving to and from a bed to chair (including a wheelchair)?: A Little Help needed walking in hospital room?: A Little Help needed climbing 3-5 steps with a railing? : A Little 6 Click Score: 16    End of Session Equipment Utilized During Treatment: Gait belt Activity Tolerance: Patient tolerated treatment well;Patient limited by fatigue Patient left: in chair;with call bell/phone within reach;with chair alarm set Nurse Communication: Mobility status PT Visit Diagnosis: Unsteadiness on feet (R26.81);Difficulty in walking, not elsewhere classified (R26.2);Muscle weakness (generalized) (M62.81)    Time: 2355-7322 PT Time Calculation (min) (ACUTE ONLY): 26 min   Charges:   PT Evaluation $PT Eval Low Complexity: 1 Low PT Treatments $Gait Training: 8-22 mins   PT G Codes:        Pg [phone]   Decklan Mau 05/29/2017, 1:54 PM

## 2017-05-30 ENCOUNTER — Inpatient Hospital Stay (HOSPITAL_COMMUNITY): Payer: Medicare Other

## 2017-05-30 DIAGNOSIS — R0600 Dyspnea, unspecified: Secondary | ICD-10-CM

## 2017-05-30 LAB — COMPREHENSIVE METABOLIC PANEL
ALBUMIN: 3.1 g/dL — AB (ref 3.5–5.0)
ALT: 19 U/L (ref 17–63)
ANION GAP: 7 (ref 5–15)
AST: 25 U/L (ref 15–41)
Alkaline Phosphatase: 53 U/L (ref 38–126)
BUN: 20 mg/dL (ref 6–20)
CHLORIDE: 101 mmol/L (ref 101–111)
CO2: 27 mmol/L (ref 22–32)
Calcium: 8.2 mg/dL — ABNORMAL LOW (ref 8.9–10.3)
Creatinine, Ser: 1.37 mg/dL — ABNORMAL HIGH (ref 0.61–1.24)
GFR calc non Af Amer: 42 mL/min — ABNORMAL LOW (ref >60)
GFR, EST AFRICAN AMERICAN: 49 mL/min — AB (ref >60)
GLUCOSE: 100 mg/dL — AB (ref 65–99)
Potassium: 4 mmol/L (ref 3.5–5.1)
SODIUM: 135 mmol/L (ref 135–145)
Total Bilirubin: 1 mg/dL (ref 0.3–1.2)
Total Protein: 6.7 g/dL (ref 6.5–8.1)

## 2017-05-30 LAB — ECHOCARDIOGRAM COMPLETE
HEIGHTINCHES: 70 in
Weight: 2874.8 oz

## 2017-05-30 LAB — CBC WITH DIFFERENTIAL/PLATELET
BASOS PCT: 1 %
Basophils Absolute: 0 10*3/uL (ref 0.0–0.1)
EOS ABS: 0.1 10*3/uL (ref 0.0–0.7)
EOS PCT: 2 %
HCT: 29 % — ABNORMAL LOW (ref 39.0–52.0)
Hemoglobin: 10 g/dL — ABNORMAL LOW (ref 13.0–17.0)
LYMPHS ABS: 0.9 10*3/uL (ref 0.7–4.0)
Lymphocytes Relative: 21 %
MCH: 31 pg (ref 26.0–34.0)
MCHC: 34.5 g/dL (ref 30.0–36.0)
MCV: 89.8 fL (ref 78.0–100.0)
MONOS PCT: 18 %
Monocytes Absolute: 0.8 10*3/uL (ref 0.1–1.0)
NEUTROS PCT: 58 %
Neutro Abs: 2.5 10*3/uL (ref 1.7–7.7)
PLATELETS: 163 10*3/uL (ref 150–400)
RBC: 3.23 MIL/uL — AB (ref 4.22–5.81)
RDW: 13.7 % (ref 11.5–15.5)
WBC: 4.2 10*3/uL (ref 4.0–10.5)

## 2017-05-30 LAB — MAGNESIUM: Magnesium: 2.2 mg/dL (ref 1.7–2.4)

## 2017-05-30 MED ORDER — CEFUROXIME AXETIL 500 MG PO TABS: 500.0000 mg | ORAL_TABLET | Freq: Two times a day (BID) | ORAL | 0 refills | Status: AC

## 2017-05-30 NOTE — Care Management Note (Addendum)
Case Management Note  Patient Details  Name: Ralph Mendoza MRN: [id number] Date of Birth: [birth year]  Subjective/Objective:   PNA                 Action/Plan: Spoke to pt and he lives in his apt alone. He has RW, bedside commode and wheelchair. He had family that is suppose to pick him up today. Pt not safe to travel home in taxi due to unsteadiness of his gait. Attempted call to family on chart but no answer. Left HIPAA appropriate message for return call. Spoke to Unit RN and she was able to get in contact with family to pick him up today. Offered choice for HH/list provided. Pt agreeable to [facility] for HH. Contacted [facility] with new referral.    Expected Discharge Date:  05/30/17               Expected Discharge Plan:  [place]  In-House Referral:  Clinical Social Work  Discharge planning Services  CM Consult  Post Acute Care Choice:  Home Health Choice offered to:  Patient  DME Arranged:  N/A DME Agency:  NA  HH Arranged:  RN, PT [place] Agency:  [facility]  Status of Service:  Completed, signed off  If discussed at [place] of Stay Meetings, dates discussed:    Additional Comments:  Erenest Rasher, RN 05/30/2017, 2:36 PM

## 2017-05-30 NOTE — Progress Notes (Signed)
  Echocardiogram 2D Echocardiogram has been performed.  Jennette Dubin 05/30/2017, 8:59 AM

## 2017-05-30 NOTE — Discharge Summary (Addendum)
Physician Discharge Summary  Ralph Mendoza WUJ:[id number] DOB: 1920-05-07 DOA: 05/26/2017  PCP: [name], MD  Admit date: 05/26/2017 Discharge date: 05/30/2017  Admitted From: ILF Disposition:  ILF  Recommendations for Outpatient Follow-up:  1. Follow up with PCP in 1 week with repeat CBC/BMP 2. Follow-up with cardiology in 1-2 weeks  Home Health: Yes Equipment/Devices: No  Discharge Condition: Stable CODE STATUS: Full Diet recommendation: Heart Healthy/as per SLP recommendations  Brief/Interim Summary: 84 year old male with history of chronic kidney disease stage III, chronic systolic CHF, atrial fibrillation on Eliquis and hypertension presented with cough.  He was admitted with left lower lobe pneumonia and started on antibiotics.  Patient's respiration status is stable, cultures have been negative.  He will be switched to oral antibiotics and discharged today.   Discharge Diagnoses:  Principal Problem:   Lobar pneumonia ([place]) Active Problems:   Atrial fibrillation (HCC)   CKD (chronic kidney disease) stage 3, GFR 30-59 ml/min (HCC)   Thrombocytopenia (HCC)   Dehydration   Chronic systolic CHF (congestive heart failure) (HCC)   Left lower  lobar pneumonia. -Currently on Rocephin and Zithromax.  Cultures negative so far.    Switch to oral Ceftin for 3 more days -Diet as per SLP recommendations -Currently on room air.    Elevated troponin. - Trivial elevation in context of CKD, chronic CHF and pneumonia.  -No chest pain. -Outpatient follow-up with cardiology.  Echo done but results pending, this can be followed up as an outpatient by primary care provider and or cardiology  Thrombocytopenia - secondary to acute infection.  Resolved  Dehydration -Treated with IV fluid.  Chronic systolic CHF LVEF 95-62% by echo 2/017 - appears compensated.  -Continue Lasix, nitrate and losartan -Patient follow-up with cardiology  Chronic prolonged QTc - avoid  QTC-prolonging agents  CKD stage III - at baseline.    Outpatient follow-up  Chronic Afib -stable. Continue apixaban. Not on rate control agent    Discharge Instructions  Discharge Instructions    (HEART FAILURE PATIENTS) Call MD:  Anytime you have any of the following symptoms: 1) 3 pound weight gain in 24 hours or 5 pounds in 1 week 2) shortness of breath, with or without a dry hacking cough 3) swelling in the hands, feet or stomach 4) if you have to sleep on extra pillows at night in order to breathe.   Complete by:  As directed    Ambulatory referral to Cardiology   Complete by:  As directed    Follow up for CHF; recent hospitalization for pneumonia   Call MD for:  difficulty breathing, headache or visual disturbances   Complete by:  As directed    Call MD for:  extreme fatigue   Complete by:  As directed    Call MD for:  hives   Complete by:  As directed    Call MD for:  persistant dizziness or light-headedness   Complete by:  As directed    Call MD for:  persistant nausea and vomiting   Complete by:  As directed    Call MD for:  severe uncontrolled pain   Complete by:  As directed    Call MD for:  temperature >100.4   Complete by:  As directed    Diet - low sodium heart healthy   Complete by:  As directed    Discharge instructions   Complete by:  As directed    Diet as per SLP recommendations   Increase activity slowly   Complete  by:  As directed      Allergies as of 05/30/2017   No Known Allergies     Medication List    TAKE these medications   acetaminophen 500 MG tablet Commonly known as:  TYLENOL Take 1,000 mg by mouth every 6 (six) hours as needed for mild pain or moderate pain.   apixaban 2.5 MG Tabs tablet Commonly known as:  ELIQUIS Take 1 tablet (2.5 mg total) by mouth 2 (two) times daily.   cefUROXime 500 MG tablet Commonly known as:  CEFTIN Take 1 tablet (500 mg total) by mouth 2 (two) times daily for 3 days.   DELSYM COUGH/CHEST CONGEST  DM PO Take 10 mLs by mouth daily as needed (for cough).   diazepam 5 MG tablet Commonly known as:  VALIUM Take 0.5 tablets (2.5 mg total) by mouth every 8 (eight) hours as needed for anxiety.   furosemide 20 MG tablet Commonly known as:  LASIX Take 1 tablet (20 mg total) 2 (two) times daily by mouth.   isosorbide dinitrate 30 MG tablet Commonly known as:  ISORDIL Take 1 tablet (30 mg total) daily by mouth.   losartan 25 MG tablet Commonly known as:  COZAAR Take 1 tablet (25 mg total) by mouth daily.   PRESERVISION AREDS 2+MULTI VIT Caps Take 1 capsule by mouth daily.      Follow-up Information    [name], MD. Schedule an appointment as soon as possible for a visit in 1 week(s).   Specialty:  Family Medicine Why:  with repeat CBC/BMP Contact information: [address] [place] [address] [phone]        Lelon Perla, MD. Schedule an appointment as soon as possible for a visit in 1 week(s).   Specialty:  Cardiology Contact information: [address] [place] [address] [phone]          No Known Allergies  Consultations:  None   Procedures/Studies: Dg Chest 2 View  Result Date: 05/28/2017 CLINICAL DATA:  Cough.  Renal failure.  Hypertension. EXAM: CHEST  2 VIEW COMPARISON:  May 26, 2017 FINDINGS: There is persistent patchy infiltrate in the posterior left base, somewhat better seen on lateral view. Lungs elsewhere clear. Heart is upper normal in size with pulmonary vascularity within normal limits. There is aortic atherosclerosis. No adenopathy. There is degenerative change in the thoracic spine. IMPRESSION: Persistent posterior left base infiltrate consistent with pneumonia. Lungs elsewhere clear. Stable cardiac silhouette. There is aortic atherosclerosis. Aortic Atherosclerosis (ICD10-I70.0). Followup PA and lateral chest radiographs recommended in 3-4 weeks following trial of antibiotic therapy to  ensure resolution and exclude underlying malignancy. Electronically Signed   By: Lowella Grip III M.D.   On: 05/28/2017 15:28   Dg Chest 2 View  Result Date: 05/26/2017 CLINICAL DATA:  Cough and congestion for 1-1/2 weeks EXAM: CHEST  2 VIEW COMPARISON:  11/24/2016 FINDINGS: Left lower lobe suspected pneumonia. No pleural effusion. Stable borderline cardiomegaly. Aortic atherosclerosis. No pneumothorax. Degenerative changes of the spine. IMPRESSION: Suspected left lower lobe pneumonia. Imaging follow-up to resolution recommended. Electronically Signed   By: Donavan Foil M.D.   On: 05/26/2017 22:37   Dg Lumbar Spine Complete  Result Date: 05/02/2017 CLINICAL DATA:  Left hip and leg pain. EXAM: LUMBAR SPINE - COMPLETE 4+ VIEW COMPARISON:  Abdomen and pelvis CT dated 06/03/2016. FINDINGS: Five non-rib-bearing lumbar vertebrae. Mild dextroconvex lumbar rotary scoliosis. Large anterior and lateral spurs at multiple levels. Facet degenerative changes throughout the  lumbar spine. No fractures, pars defects or subluxations. Atheromatous arterial calcifications. IMPRESSION: Extensive degenerative changes and mild scoliosis. Electronically Signed   By: Claudie Revering M.D.   On: 05/02/2017 15:25   Dg Hip Unilat With Pelvis 2-3 Views Left  Result Date: 05/02/2017 CLINICAL DATA:  Left hip and leg pain.  No known injury. EXAM: DG HIP (WITH OR WITHOUT PELVIS) 2-3V LEFT COMPARISON:  None. FINDINGS: Mild to moderate left femoral head and neck junction spur formation and mild right femoral head and neck junction spur formation. Minimal left hip joint space narrowing. Lower lumbar spine degenerative changes. Mild atheromatous arterial calcifications. IMPRESSION: 1. Mild left hip degenerative changes and minimal right hip degenerative changes. 2. Lower lumbar spine degenerative changes. Electronically Signed   By: Claudie Revering M.D.   On: 05/02/2017 15:23    Echo report pending   Subjective: Patient seen and  examined at bedside.  He is very hard of hearing.  He feels better.  No overnight fever or vomiting.  Discharge Exam: Vitals:   05/29/17 2128 05/30/17 0512  BP: (!) 132/55 135/71  Pulse: 70 70  Resp: 19 20  Temp: 98.1 F (36.7 C) 98.1 F (36.7 C)  SpO2: 98% 94%   Vitals:   05/29/17 0503 05/29/17 1357 05/29/17 2128 05/30/17 0512  BP: (!) 127/46 115/80 (!) 132/55 135/71  Pulse: (!) 56 61 70 70  Resp: 19 16 19 20   Temp: 98.5 F (36.9 C) 97.8 F (36.6 C) 98.1 F (36.7 C) 98.1 F (36.7 C)  TempSrc: Oral Oral Oral Oral  SpO2: 93% 98% 98% 94%  Weight:  83.1 kg (183 lb 4.8 oz)  81.5 kg (179 lb 10.8 oz)  Height:        General: Elderly male.  Very hard of hearing.  No acute distress  cardiovascular: Rate controlled, S1/S2 + Respiratory: Bilateral decreased breath sounds at bases Abdominal: Soft, NT, ND, bowel sounds + Extremities: Trace edema, no cyanosis    The results of significant diagnostics from this hospitalization (including imaging, microbiology, ancillary and laboratory) are listed below for reference.     Microbiology: No results found for this or any previous visit (from the past 240 hour(s)).   Labs: BNP (last 3 results) Recent Labs    09/27/16 1015 05/26/17 2320  BNP 438.2* 701.7*   Basic Metabolic Panel: Recent Labs  Lab 05/26/17 2234 05/28/17 0415 05/30/17 0415  NA 133* 133* 135  K 3.4* 3.5 4.0  CL 95* 100* 101  CO2 26 23 27   GLUCOSE 142* 111* 100*  BUN 29* 24* 20  CREATININE 1.59* 1.47* 1.37*  CALCIUM 8.4* 8.0* 8.2*  MG  --   --  2.2   Liver Function Tests: Recent Labs  Lab 05/30/17 0415  AST 25  ALT 19  ALKPHOS 53  BILITOT 1.0  PROT 6.7  ALBUMIN 3.1*   No results for input(s): LIPASE, AMYLASE in the last 168 hours. No results for input(s): AMMONIA in the last 168 hours. CBC: Recent Labs  Lab 05/26/17 2234 05/28/17 0415 05/30/17 0415  WBC 7.7 5.9 4.2  NEUTROABS  --   --  2.5  HGB 11.9* 10.6* 10.0*  HCT 34.4* 30.7* 29.0*   MCV 90.5 89.5 89.8  PLT 142* 131* 163   Cardiac Enzymes: Recent Labs  Lab 05/27/17 0509  TROPONINI 0.08*   BNP: Invalid input(s): POCBNP CBG: No results for input(s): GLUCAP in the last 168 hours. D-Dimer No results for input(s): DDIMER in the last 72 hours.  Hgb A1c No results for input(s): HGBA1C in the last 72 hours. Lipid Profile No results for input(s): CHOL, HDL, LDLCALC, TRIG, CHOLHDL, LDLDIRECT in the last 72 hours. Thyroid function studies No results for input(s): TSH, T4TOTAL, T3FREE, THYROIDAB in the last 72 hours.  Invalid input(s): FREET3 Anemia work up No results for input(s): VITAMINB12, FOLATE, FERRITIN, TIBC, IRON, RETICCTPCT in the last 72 hours. Urinalysis    Component Value Date/Time   COLORURINE YELLOW 05/02/2017 1350   APPEARANCEUR CLEAR 05/02/2017 1350   LABSPEC 1.021 05/02/2017 1350   PHURINE 6.0 05/02/2017 1350   GLUCOSEU NEGATIVE 05/02/2017 1350   HGBUR NEGATIVE 05/02/2017 1350   BILIRUBINUR NEGATIVE 05/02/2017 1350   KETONESUR NEGATIVE 05/02/2017 1350   PROTEINUR 30 (A) 05/02/2017 1350   NITRITE NEGATIVE 05/02/2017 1350   LEUKOCYTESUR NEGATIVE 05/02/2017 1350   Sepsis Labs Invalid input(s): PROCALCITONIN,  WBC,  LACTICIDVEN Microbiology No results found for this or any previous visit (from the past 240 hour(s)).   Time coordinating discharge: 35 minutes  SIGNED:   Aline August, MD  [facility] 05/30/2017, 10:05 AM Pager: [phone]  If 7PM-7AM, please contact night-coverage [email or web address] Password [email or web address]

## 2017-05-30 NOTE — Social Work (Signed)
CSW was contacted by Floor RN for transportation assistance. Pt resides in independent living at [facility]. CSW provided RN nurse with taxi cab voucher and placed on chart. Pt cleared by PT for home needs and ambulated min 200 feet.  CSW will sign off for now as social work intervention is no longer needed. Please consult [place] again if new need arises.  Elissa Hefty, [place] Social Worker-Weekend [phone]

## 2017-05-30 NOTE — Care Management Important Message (Signed)
Important Message  Patient Details  Name: Ralph Mendoza MRN: [id number] Date of Birth: 1921/03/08   Medicare Important Message Given:  Yes    Erenest Rasher, RN 05/30/2017, 12:41 PM

## 2017-05-30 NOTE — Progress Notes (Signed)
Patient unsafe to discharge home in taxi d/t age and necessary equipment is inside his house (rolling walker).  Patient's daughter en route from [place] to pick up patient and take him safely to his home.  She states she will arrive around 6pm.  Charge nurse aware.  Will continue to monitor patient.

## 2017-05-30 NOTE — Evaluation (Signed)
Clinical/Bedside Swallow Evaluation Patient Details  Name: Ralph Mendoza MRN: [id number] Date of Birth: [birth year]  Today's Date: 05/30/2017 Time: SLP Start Time (ACUTE ONLY): 0930 SLP Stop Time (ACUTE ONLY): 0942 SLP Time Calculation (min) (ACUTE ONLY): 12 min  Past Medical History:  Past Medical History:  Diagnosis Date  . Atrial fibrillation ([place])   . BPH (benign prostatic hyperplasia)   . Dyspnea on exertion    a. 09/2008 NL EF on myoview.  [name] Hearing difficulty    R sided hole in eardrum, only wears L hearing aid  . History of tobacco abuse   . Hypertension   . Lumbar stenosis with neurogenic claudication    a. 02/2011 s/p decompressive laminectomy.  . Midsternal chest pain    a. 09/2008 Myoview: EF 56%, mild fixed thinning of inf wall particularly @ base -? attenuation.  No ischemia  . PUD (peptic ulcer disease)    Remote, history of  . Urinary hesitancy    Past Surgical History:  Past Surgical History:  Procedure Laterality Date  . HERNIA REPAIR     Left  . LUMBAR LAMINECTOMY/DECOMPRESSION MICRODISCECTOMY  03/04/2011   Procedure: LUMBAR LAMINECTOMY/DECOMPRESSION MICRODISCECTOMY;  Surgeon: Ophelia Charter;  Location: [place] [facility];  Service: Neurosurgery;  Laterality: N/A;  LUMBAR THREE-FOUR ,FOUR-FIVE LAMINECTOMIES  . TYMPANOMASTOIDECTOMY     Left modified radical with type 3 tympanoplasty   HPI:  84 year old male with history of chronic kidney disease stage III, chronic systolic CHF, atrial fibrillation on Eliquis and hypertension presented with cough. He was admitted with left lower lobe pneumonia and started on antibiotics. Pt has history of hiatal hernia, Schatzki's ring (1999), esophageal stricture s/p savary dilation 01/29/12.   Assessment / Plan / Recommendation Clinical Impression  Patient presents with oropharyngeal swallow which appears at bedside to be within functional limits with adequate airway protection. No overt signs of aspiration observed despite  challenging with consecutive straw sips of thin liquids in excess of 3oz. Patient does have a history of esophageal issues including stricture requiring dilation, most recently in 2013. He denies symptoms of food sticking, coughing after eating or when lying down. He does remain at mildly increased risk for aspiration due to esophageal history. Recommend regular diet with thin liquids with esophageal precautions, meds whole with liquid. No further skilled ST needs identified. Will s/o.   SLP Visit Diagnosis: Dysphagia, unspecified (R13.10)    Aspiration Risk  Mild aspiration risk    Diet Recommendation Regular;Thin liquid   Liquid Administration via: Cup;Straw Medication Administration: Whole meds with liquid Supervision: Patient able to self feed Compensations: Slow rate;Follow solids with liquid Postural Changes: Seated upright at 90 degrees;Remain upright for at least 30 minutes after po intake    Other  Recommendations Oral Care Recommendations: Oral care BID   Follow up Recommendations None      Frequency and Duration Other (Comment)(evaluation only)          Prognosis Prognosis for Safe Diet Advancement: Good      Swallow Study   General Date of Onset: 05/26/17 HPI: 84 year old male with history of chronic kidney disease stage III, chronic systolic CHF, atrial fibrillation on Eliquis and hypertension presented with cough. He was admitted with left lower lobe pneumonia and started on antibiotics. Pt has history of hiatal hernia, Schatzki's ring (1999), esophageal stricture s/p savary dilation 01/29/12. Type of Study: Bedside Swallow Evaluation Previous Swallow Assessment: see HPI; history of esophageal issues Diet Prior to this Study: Regular;Thin liquids Temperature Spikes Noted: No Respiratory  Status: Room air History of Recent Intubation: No Behavior/Cognition: Alert;Cooperative;Pleasant mood Oral Cavity Assessment: Within Functional Limits Oral Care Completed by SLP:  No Oral Cavity - Dentition: Dentures, top;Dentures, bottom Vision: Functional for self-feeding Self-Feeding Abilities: Able to feed self Patient Positioning: Upright in chair Baseline Vocal Quality: Normal Volitional Cough: Strong Volitional Swallow: Able to elicit    Oral/Motor/Sensory Function Overall Oral Motor/Sensory Function: Within functional limits   Ice Chips Ice chips: Not tested   Thin Liquid Thin Liquid: Within functional limits Presentation: Cup;Straw;Self Fed    Nectar Thick Nectar Thick Liquid: Not tested   Honey Thick Honey Thick Liquid: Not tested   Puree Puree: Within functional limits Presentation: Self Fed;Spoon   Solid   GO   Solid: Within functional limits Presentation: [place], [place], [place] Speech-Language Pathologist  Aliene Altes 05/30/2017,9:52 AM

## 2017-05-30 NOTE — Progress Notes (Signed)
Patient's daughter and son-in-law here to pick up patient.  Discharge instructions reviewed with patient's daughter.  Patient's daughter verbalized understanding of discharge paperwork via teach back method.  Patient's belongings given to patient including hearing aids and glasses.  Will proceed with discharge.

## 2017-06-21 ENCOUNTER — Encounter: Payer: Self-pay | Admitting: Physician Assistant

## 2017-06-21 ENCOUNTER — Ambulatory Visit: Payer: Medicare Other | Admitting: Physician Assistant

## 2017-06-21 VITALS — BP 120/62 | HR 67 | Ht 70.0 in | Wt 177.2 lb

## 2017-06-21 DIAGNOSIS — I4821 Permanent atrial fibrillation: Secondary | ICD-10-CM

## 2017-06-21 DIAGNOSIS — J90 Pleural effusion, not elsewhere classified: Secondary | ICD-10-CM

## 2017-06-21 DIAGNOSIS — I482 Chronic atrial fibrillation: Secondary | ICD-10-CM

## 2017-06-21 DIAGNOSIS — I5022 Chronic systolic (congestive) heart failure: Secondary | ICD-10-CM | POA: Diagnosis not present

## 2017-06-21 DIAGNOSIS — I10 Essential (primary) hypertension: Secondary | ICD-10-CM

## 2017-06-21 DIAGNOSIS — I428 Other cardiomyopathies: Secondary | ICD-10-CM

## 2017-06-21 MED ORDER — LOSARTAN POTASSIUM 25 MG PO TABS: 25.0000 mg | ORAL_TABLET | Freq: Every day | ORAL | 3 refills | Status: DC

## 2017-06-21 NOTE — Patient Instructions (Signed)
Medication Instructions: Your physician recommends that you continue on your current medications as directed. Please refer to the Current Medication list given to you today.  If you need a refill on your cardiac medications before your next appointment, please call your pharmacy.    Follow-Up: Your physician wants you to follow-up in 4-6 months with Dr. Stanford Breed. You will receive a reminder letter in the mail two months in advance. If you don't receive a letter, please call our office at [phone] to schedule this follow-up appointment.   Thank you for choosing Heartcare at [facility]!!

## 2017-06-21 NOTE — Progress Notes (Signed)
Cardiology Office Note    Date:  06/21/2017   ID:  [name], DOB [birth year], MRN [id number]  PCP:  [name], MD  Cardiologist:  Dr. Stanford Breed   Chief Complaint  Patient presents with  . Follow-up    seen for Dr. Stanford Breed    History of Present Illness:  Ralph Mendoza is a 84 y.o. male with PMH of chronic atrial fibrillation on rate control, HTN, and h/o PUD. Heart monitor in February 2014 showed sinus rhythm, mobitz 1 block. Toprol was discontinued. Echocardiogram in August 2017 showed EF 25-30%, moderate mitral regurgitation, biatrial enlargement, mild right ventricular enlargement. Pulmonary pressure moderately increased. Ejection fraction has decreased compared to the previous study which showed EF 40-45%. Nuclear study obtained in September 2017 showed EF 28%, no ischemia fixed apical defect. It was decided to treat his nonischemic cardiomyopathy with medical therapy given his advanced age. CT scanning in January 2018 showed large right and a small left pleural effusion, he eventually had thoracentesis on 05/07/2016. He had repeat thoracentesis in June 2018.  He was admitted in Feb 2019 for cough and diagnosed with LLL PNA. Trop was mildly elevated by flat. Echo obtained 05/30/2017 showed EF similar to previous EF in 11/2015, diffuse hypokinesis worse in inferior wall, PA peak pressure 52 mmHg.  Patient presents today for cardiology office visit.  He has been compliant with his medication.  Despite his advanced age, he still ambulates okay without any imbalance issue or a falling episode recently.  He is on 2.5 mg twice daily of Eliquis I think mainly for his age and renal function.  Although sometimes his creatinine is about 1.5, a lot of the time is lower than 1.5.  His weight is above 60 kg.  Theoretically, he is right on the cusp between 2.5mg  versus 5 mg Eliquis.  For now I will continue him on 2.5 mg Eliquis.  Otherwise he has no lower extremity edema, orthopnea or PND.  He has  very sporadic crackles in the left base, however suspicion for significant fluid in the left base is very low.  I do not see any significant JVD.  His heart rate is still irregular on physical exam, however very well controlled on no AV nodal blocking agent.  I will defer to his PCP to monitor cholesterol.   Past Medical History:  Diagnosis Date  . Atrial fibrillation ([name])   . BPH (benign prostatic hyperplasia)   . Dyspnea on exertion    a. 09/2008 NL EF on myoview.  [name] Hearing difficulty    R sided hole in eardrum, only wears L hearing aid  . History of tobacco abuse   . Hypertension   . Lumbar stenosis with neurogenic claudication    a. 02/2011 s/p decompressive laminectomy.  . Midsternal chest pain    a. 09/2008 Myoview: EF 56%, mild fixed thinning of inf wall particularly @ base -? attenuation.  No ischemia  . PUD (peptic ulcer disease)    Remote, history of  . Urinary hesitancy     Past Surgical History:  Procedure Laterality Date  . HERNIA REPAIR     Left  . LUMBAR LAMINECTOMY/DECOMPRESSION MICRODISCECTOMY  03/04/2011   Procedure: LUMBAR LAMINECTOMY/DECOMPRESSION MICRODISCECTOMY;  Surgeon: Ophelia Charter;  Location: [place];  Service: Neurosurgery;  Laterality: N/A;  LUMBAR THREE-FOUR ,FOUR-FIVE LAMINECTOMIES  . TYMPANOMASTOIDECTOMY     Left modified radical with type 3 tympanoplasty    Current Medications: Outpatient Medications Prior to Visit  Medication Sig  Dispense Refill  . acetaminophen (TYLENOL) 500 MG tablet Take 1,000 mg by mouth every 6 (six) hours as needed for mild pain or moderate pain.    [name] apixaban (ELIQUIS) 2.5 MG TABS tablet Take 1 tablet (2.5 mg total) by mouth 2 (two) times daily. 180 tablet 1  . Dextromethorphan-Guaifenesin (DELSYM COUGH/CHEST CONGEST DM PO) Take 10 mLs by mouth daily as needed (for cough).     . diazepam (VALIUM) 5 MG tablet Take 0.5 tablets (2.5 mg total) by mouth every 8 (eight) hours as needed for anxiety. 10 tablet 0  .  furosemide (LASIX) 20 MG tablet Take 1 tablet (20 mg total) 2 (two) times daily by mouth. 90 tablet 2  . isosorbide dinitrate (ISORDIL) 30 MG tablet Take 1 tablet (30 mg total) daily by mouth. 90 tablet 3  . Multiple Vitamins-Minerals (PRESERVISION AREDS 2+MULTI VIT) CAPS Take 1 capsule by mouth daily.    [name] losartan (COZAAR) 25 MG tablet Take 1 tablet (25 mg total) by mouth daily. 90 tablet 3   No facility-administered medications prior to visit.      Allergies:   Patient has no known allergies.   Social History   Socioeconomic History  . Marital status: Widowed    Spouse name: None  . Number of children: 2  . Years of education: None  . Highest education level: None  Social Needs  . Financial resource strain: None  . Food insecurity - worry: None  . Food insecurity - inability: None  . Transportation needs - medical: None  . Transportation needs - non-medical: None  Occupational History  . Occupation: [employer]    Comment: Retired  Tobacco Use  . Smoking status: Former Research scientist (life sciences)  . Smokeless tobacco: Never Used  . Tobacco comment: Remote history of smoking  Substance and Sexual Activity  . Alcohol use: No  . Drug use: No  . Sexual activity: None  Other Topics Concern  . None  Social History Narrative   Lives with his wife in [place].   He swims daily.   He has 2 grown daughters.   1 Caffeine drink daily      Family History:  The patient's family history includes Appendicitis in his father; Diabetes in his sister; Lung cancer in his mother.   ROS:   Please see the history of present illness.    ROS All other systems reviewed and are negative.   PHYSICAL EXAM:   VS:  BP 120/62   Pulse 67   Ht 5\' 10"  (1.778 m)   Wt 177 lb 3.2 oz (80.4 kg)   SpO2 96%   BMI 25.43 kg/m    GEN: Well nourished, well developed, in no acute distress  HEENT: normal  Neck: no JVD, carotid bruits, or masses Cardiac: RRR; no murmurs, rubs, or gallops,no edema  Respiratory:   clear to auscultation bilaterally, normal work of breathing GI: soft, nontender, nondistended, + BS MS: no deformity or atrophy  Skin: warm and dry, no rash Neuro:  Alert and Oriented x 3, Strength and sensation are intact Psych: euthymic mood, full affect  Wt Readings from Last 3 Encounters:  06/21/17 177 lb 3.2 oz (80.4 kg)  05/30/17 179 lb 10.8 oz (81.5 kg)  05/02/17 182 lb (82.6 kg)      Studies/Labs Reviewed:   EKG:  EKG is not ordered today.    Recent Labs: 05/26/2017: B Natriuretic Peptide 402.3 05/30/2017: ALT 19; BUN 20; Creatinine, Ser 1.37; Hemoglobin 10.0; Magnesium 2.2; Platelets  163; Potassium 4.0; Sodium 135   Lipid Panel No results found for: CHOL, TRIG, HDL, CHOLHDL, VLDL, LDLCALC, LDLDIRECT  Additional studies/ records that were reviewed today include:   Echo 05/30/2017 Study Conclusions  - Left ventricle: EF similar to August 2017 diffuse hypokinesis   worse in inferior wall. The cavity size was moderately dilated.   Wall thickness was normal. The study is not technically   sufficient to allow evaluation of LV diastolic function. - Aortic valve: There was trivial regurgitation. - Left atrium: The atrium was mildly dilated. - Right ventricle: The cavity size was moderately dilated. - Right atrium: The atrium was mildly dilated. - Atrial septum: No defect or patent foramen ovale was identified. - Tricuspid valve: There was moderate regurgitation. - Pulmonary arteries: PA peak pressure: 52 mm Hg (S).    ASSESSMENT:    1. Chronic systolic heart failure ([place])   2. NICM (nonischemic cardiomyopathy) ([place])   3. Permanent atrial fibrillation ([place])   4. Essential hypertension   5. Pleural effusion      PLAN:  In order of problems listed above:  1. Chronic systolic heart failure: On Lasix 20 mg twice daily which has been his home dose since last year.  Appears to be euvolemic on physical exam.  Recently went to the hospital for left lower lobe pneumonia,  this has resolved, he is ambulating with rolling walker at home.  Otherwise he denies any significant chest pain, shortness of breath, orthopnea or PND.  2. Nonischemic cardiomyopathy: On losartan and isosorbide dinitrate.  I did not add hydralazine at this time given well-controlled blood pressure and his advanced age  16. Permanent atrial fibrillation: On Eliquis 2.5 mg twice daily.  The dosage of Eliquis is right on the cusp of 2.5 versus 5 mg.  However given his advanced age, I would be more inclined to keep him on 2.5 mg twice daily dosage.  Otherwise he had a history of Mobitz 1 of beta-blocker, since his Toprol-XL was discontinued before, he is actually quite well self rate controlled on no AV nodal blocking agent  4. Hypertension: Blood pressure very well controlled  5. Pleural effusion: He underwent 2 right-sided thoracentesis in 2018.  We will continue on the current dose of diuretic.    Medication Adjustments/Labs and Tests Ordered: Current medicines are reviewed at length with the patient today.  Concerns regarding medicines are outlined above.  Medication changes, Labs and Tests ordered today are listed in the Patient Instructions below. Patient Instructions  Medication Instructions: Your physician recommends that you continue on your current medications as directed. Please refer to the Current Medication list given to you today.  If you need a refill on your cardiac medications before your next appointment, please call your pharmacy.    Follow-Up: Your physician wants you to follow-up in 4-6 months with Dr. Stanford Breed. You will receive a reminder letter in the mail two months in advance. If you don't receive a letter, please call our office at [phone] to schedule this follow-up appointment.   Thank you for choosing Heartcare at [facility], [place]  06/21/2017 10:45 AM    [address] [place], [place], [place]   [address] Phone: [phone]; Fax: [phone]

## 2017-08-03 ENCOUNTER — Other Ambulatory Visit: Payer: Self-pay | Admitting: Cardiology

## 2017-10-02 ENCOUNTER — Other Ambulatory Visit: Payer: Self-pay | Admitting: Cardiology

## 2017-11-30 IMAGING — CR DG CHEST 1V
2 series · 2 of 2 positions shown · non-contrast
Comparison: 04/28/2016; 12/08/2015

CLINICAL DATA: Post right-sided thoracentesis.

EXAM:
CHEST 1 VIEW

[w chest pa]
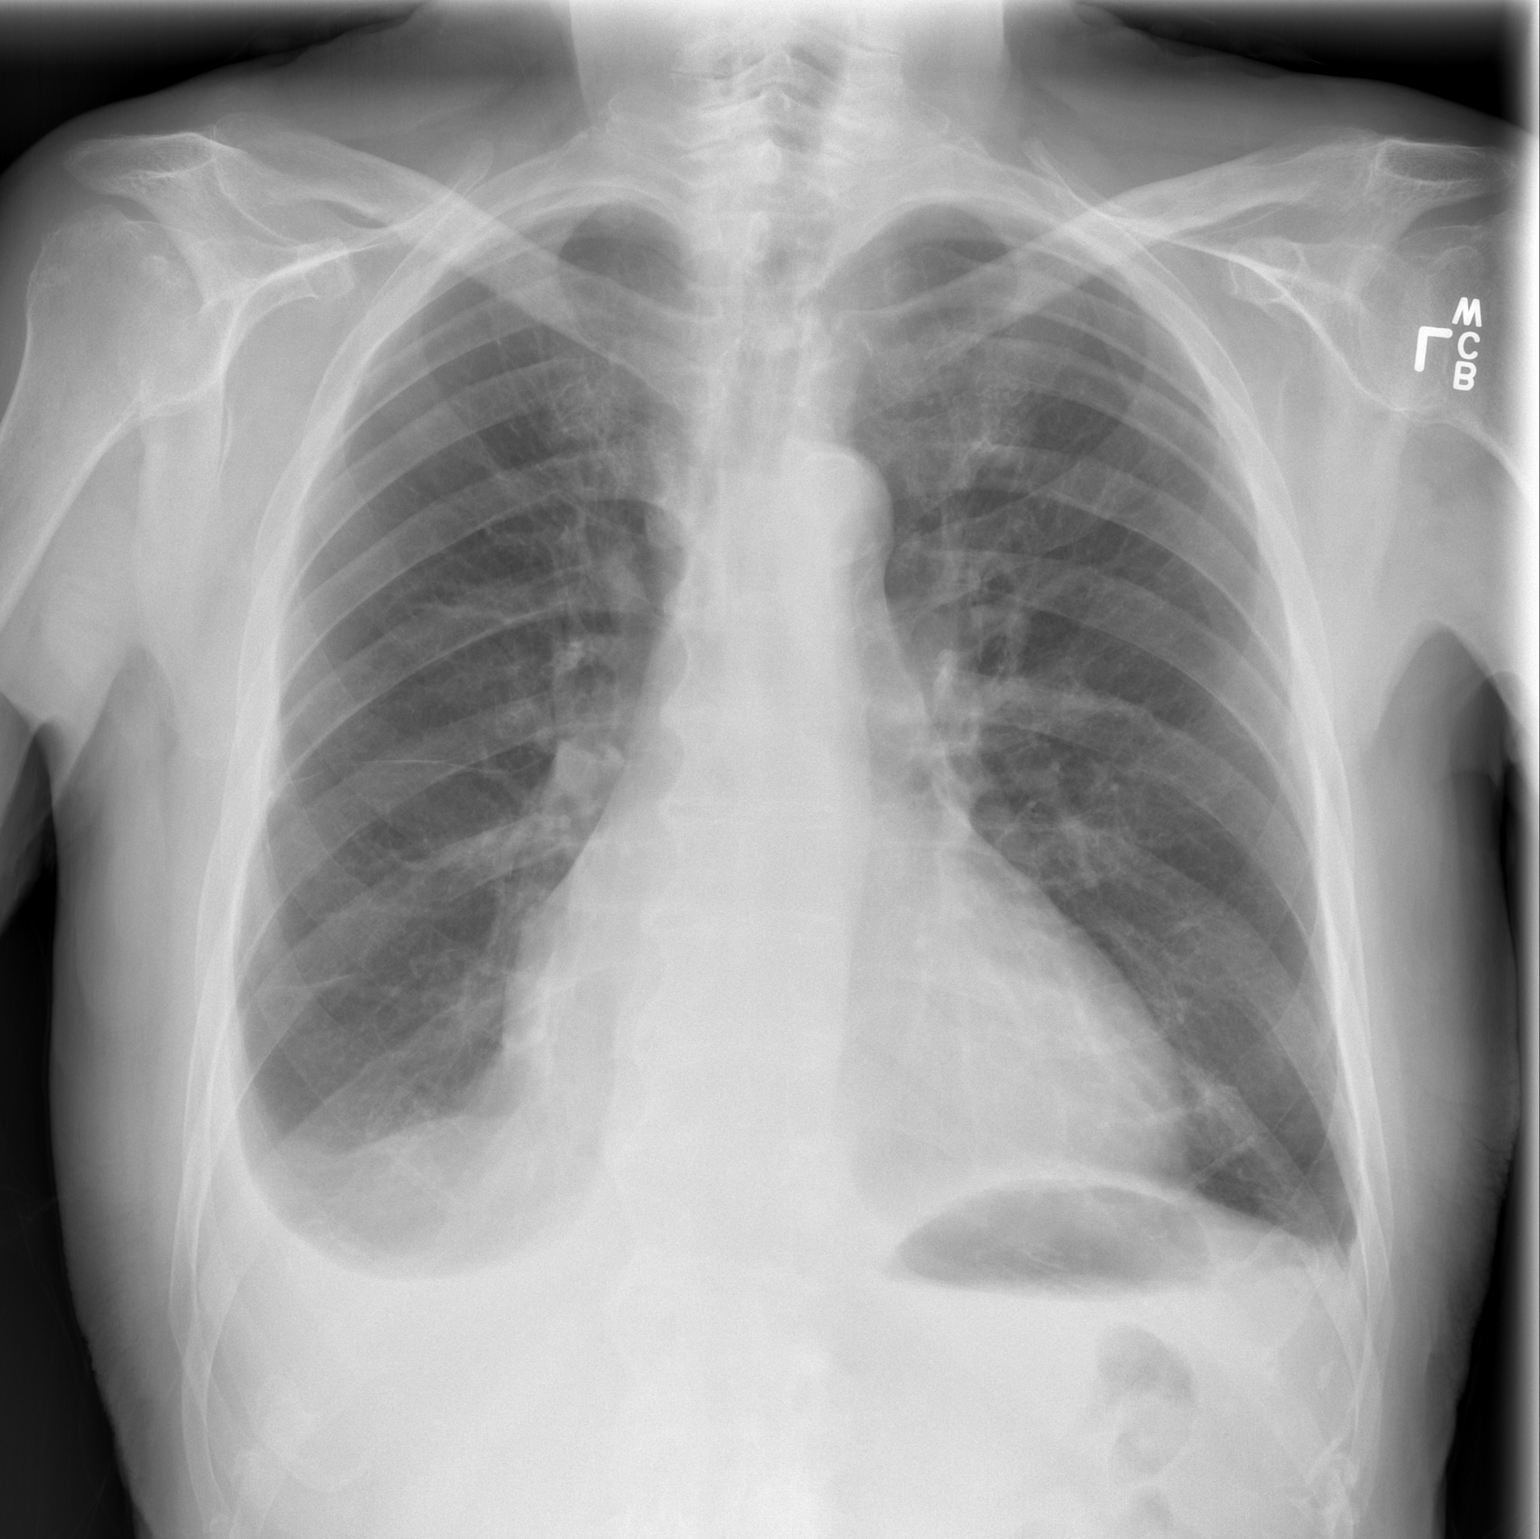

[w chest lat]
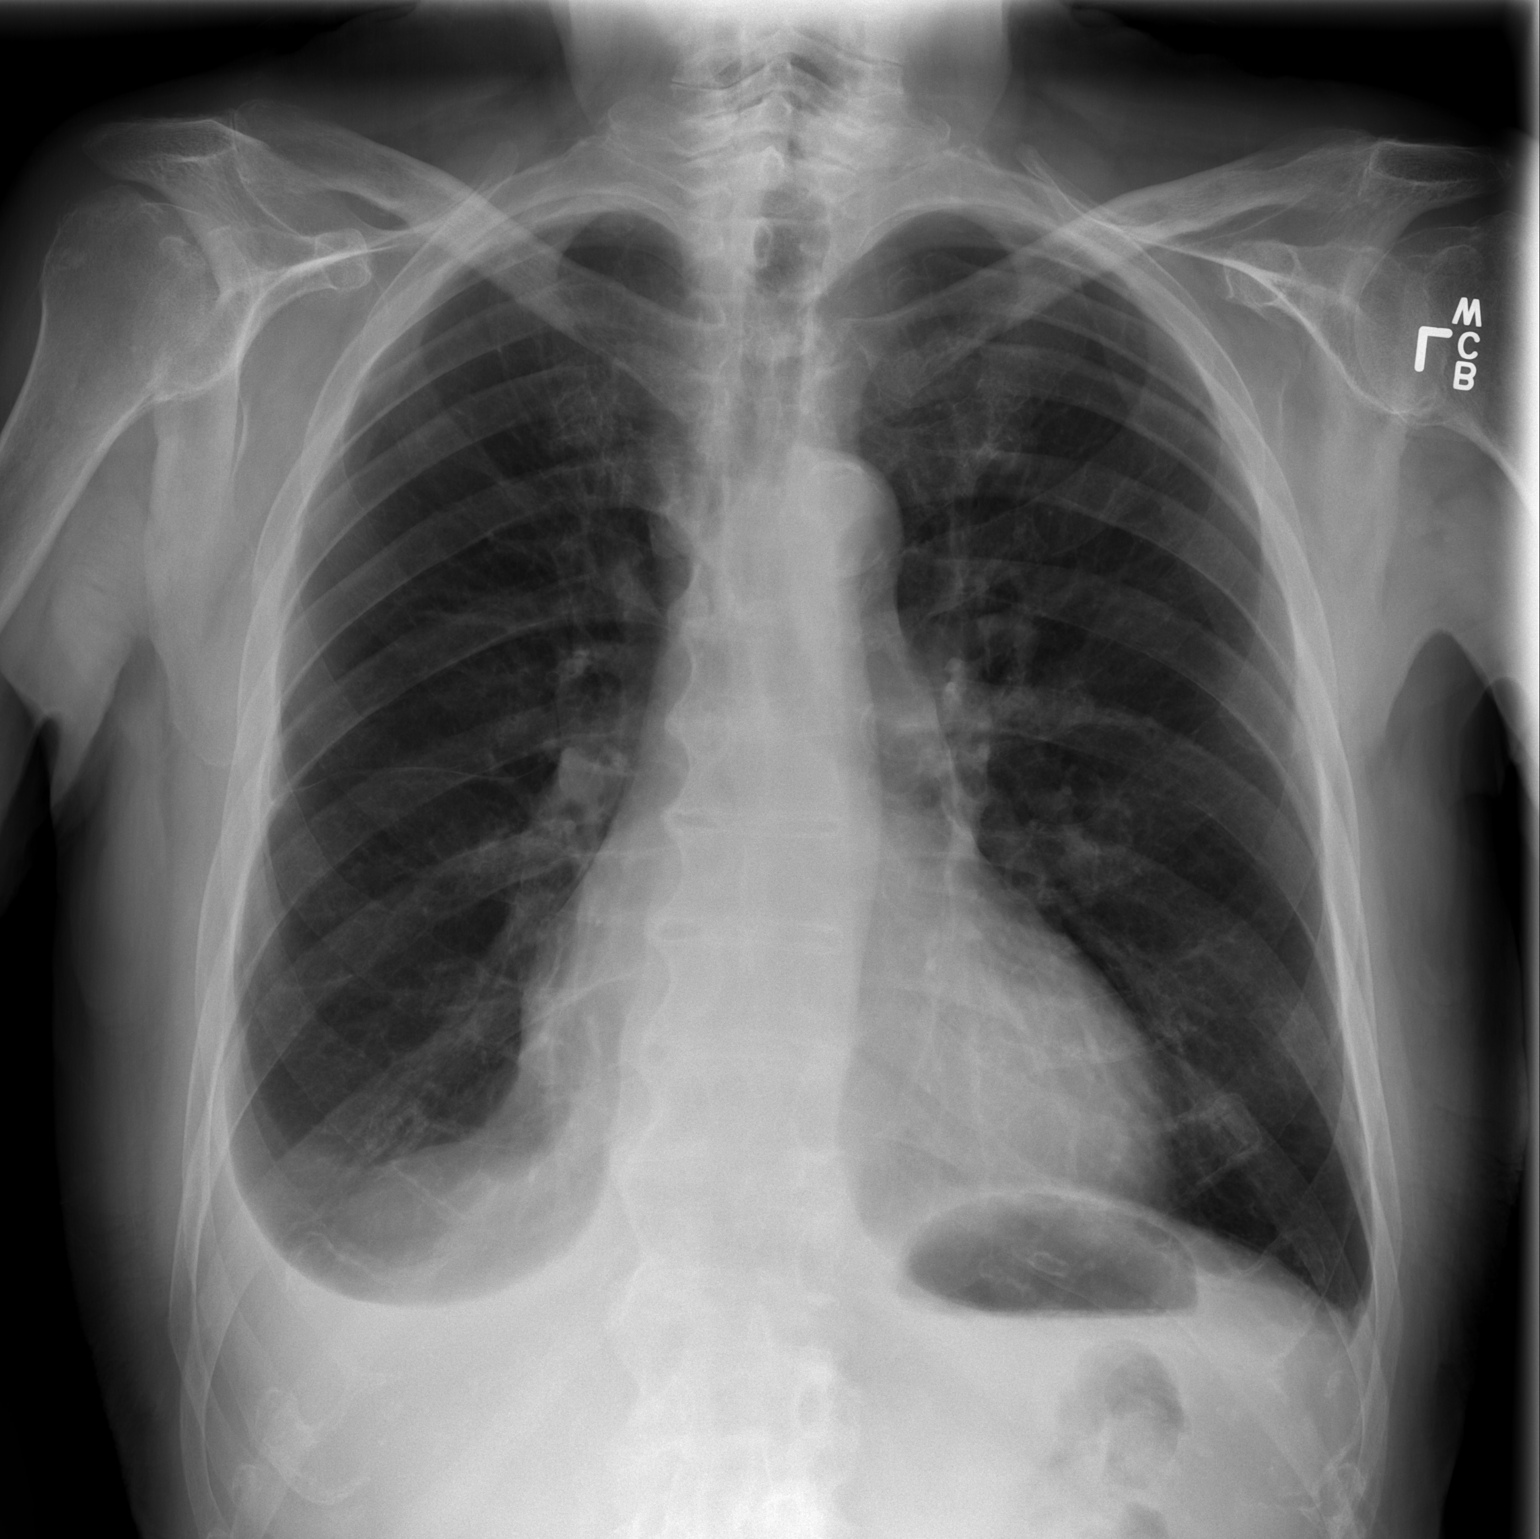

[2 of 2 positions shown; findings below may reference images not displayed]

FINDINGS: Grossly unchanged cardiac silhouette and mediastinal contours.
Interval reduction and persistent small right-sided effusion post
thoracentesis. No pneumothorax. Improved aeration of the right lower
lung with minimal residual right basilar opacities, likely
atelectasis. Unchanged trace left-sided effusion. No evidence of
edema. No acute osseus abnormalities.
IMPRESSION: Interval reduction in persistent small right-sided effusion post
thoracentesis. No pneumothorax.

## 2017-12-31 ENCOUNTER — Other Ambulatory Visit: Payer: Self-pay | Admitting: Cardiology

## 2018-01-22 ENCOUNTER — Other Ambulatory Visit: Payer: Self-pay | Admitting: Cardiology

## 2018-01-31 ENCOUNTER — Other Ambulatory Visit: Payer: Self-pay | Admitting: Cardiology

## 2018-01-31 NOTE — Telephone Encounter (Signed)
Rx has been sent to the pharmacy electronically. ° °

## 2018-03-08 ENCOUNTER — Other Ambulatory Visit: Payer: Self-pay | Admitting: Cardiology

## 2018-03-21 NOTE — Progress Notes (Signed)
HPI: FU atrial fibrillation. We have elected rate control and anticoagulation for afib. Holter monitor in February of 2014 showed sinus rhythm, mobitz 1; toprol DCed. Nuclear study September 2017 showed ejection fraction 28%. No ischemia. Fixed apical defect. Patient had CT scan January 2018 that showed large right and small left pleural effusion. Probable area of atelectasis in anterior right upper lobe but follow-up recommended in 2-3 months. BNP 318. Patient had thoracentesis January 18.Cytology negative.Patient had repeat thoracentesis June 2018.Fluidnot sent for analysis. Echocardiogram February 2019 described as diffuse hypokinesis with ejection fraction similar to study in August 2017 which was 25 to 30%.  There was biatrial enlargement and moderate tricuspid regurgitation.  Since last seen,he denies increased dyspnea, chest pain, palpitations, syncope or pedal edema.  Current Outpatient Medications  Medication Sig Dispense Refill  . acetaminophen (TYLENOL) 500 MG tablet Take 1,000 mg by mouth every 6 (six) hours as needed for mild pain or moderate pain.    [name] Dextromethorphan-Guaifenesin (DELSYM COUGH/CHEST CONGEST DM PO) Take 10 mLs by mouth daily as needed (for cough).     . diazepam (VALIUM) 5 MG tablet Take 0.5 tablets (2.5 mg total) by mouth every 8 (eight) hours as needed for anxiety. 10 tablet 0  . ELIQUIS 2.5 MG TABS tablet TAKE 1 TABLET TWICE A DAY 180 tablet 1  . furosemide (LASIX) 20 MG tablet TAKE 1 TABLET TWICE A DAY 90 tablet 1  . isosorbide dinitrate (ISORDIL) 30 MG tablet TAKE 1 TABLET DAILY 90 tablet 3  . losartan (COZAAR) 25 MG tablet TAKE 1 TABLET DAILY 90 tablet 0  . Multiple Vitamins-Minerals (PRESERVISION AREDS 2+MULTI VIT) CAPS Take 1 capsule by mouth daily.     No current facility-administered medications for this visit.      Past Medical History:  Diagnosis Date  . Atrial fibrillation ([place])   . BPH (benign prostatic hyperplasia)   . Dyspnea on  exertion    a. 09/2008 NL EF on myoview.  [name] Hearing difficulty    R sided hole in eardrum, only wears L hearing aid  . History of tobacco abuse   . Hypertension   . Lumbar stenosis with neurogenic claudication    a. 02/2011 s/p decompressive laminectomy.  . Midsternal chest pain    a. 09/2008 Myoview: EF 56%, mild fixed thinning of inf wall particularly @ base -? attenuation.  No ischemia  . PUD (peptic ulcer disease)    Remote, history of  . Urinary hesitancy     Past Surgical History:  Procedure Laterality Date  . HERNIA REPAIR     Left  . LUMBAR LAMINECTOMY/DECOMPRESSION MICRODISCECTOMY  03/04/2011   Procedure: LUMBAR LAMINECTOMY/DECOMPRESSION MICRODISCECTOMY;  Surgeon: Ophelia Charter;  Location: [place] [facility];  Service: Neurosurgery;  Laterality: N/A;  LUMBAR THREE-FOUR ,FOUR-FIVE LAMINECTOMIES  . TYMPANOMASTOIDECTOMY     Left modified radical with type 3 tympanoplasty    Social History   Socioeconomic History  . Marital status: Widowed    Spouse name: Not on file  . Number of children: 2  . Years of education: Not on file  . Highest education level: Not on file  Occupational History  . Occupation: [employer]    Comment: Retired  Scientific laboratory technician  . Financial resource strain: Not on file  . Food insecurity:    Worry: Not on file    Inability: Not on file  . Transportation needs:    Medical: Not on file    Non-medical: Not on file  Tobacco Use  . Smoking status: Former Research scientist (life sciences)  . Smokeless tobacco: Never Used  . Tobacco comment: Remote history of smoking  Substance and Sexual Activity  . Alcohol use: No  . Drug use: No  . Sexual activity: Not on file  Lifestyle  . Physical activity:    Days per week: Not on file    Minutes per session: Not on file  . Stress: Not on file  Relationships  . Social connections:    Talks on phone: Not on file    Gets together: Not on file    Attends religious service: Not on file    Active member of club or  organization: Not on file    Attends meetings of clubs or organizations: Not on file    Relationship status: Not on file  . Intimate partner violence:    Fear of current or ex partner: Not on file    Emotionally abused: Not on file    Physically abused: Not on file    Forced sexual activity: Not on file  Other Topics Concern  . Not on file  Social History Narrative   Lives with his wife in [place].   He swims daily.   He has 2 grown daughters.   1 Caffeine drink daily     Family History  Problem Relation Age of Onset  . Lung cancer Mother   . Appendicitis Father   . Diabetes Sister        DM  . Coronary artery disease Neg Hx        No premature CAD  . Colon cancer Neg Hx     ROS: no fevers or chills, productive cough, hemoptysis, dysphasia, odynophagia, melena, hematochezia, dysuria, hematuria, rash, seizure activity, orthopnea, PND, pedal edema, claudication. Remaining systems are negative.  Physical Exam: Well-developed well-nourished in no acute distress.  Skin is warm and dry.  HEENT is normal.  Neck is supple.  Chest with mildly diminished BS RLL Cardiovascular exam is irregular Abdominal exam nontender or distended. No masses palpated. Extremities show no edema. neuro grossly intact  ECG-atrial fibrillation at a rate of 81.  Left axis deviation.  No ST changes.  Personally reviewed  A/P  1 chronic systolic congestive heart failure-patient appears to be doing reasonably well from a volume standpoint.  Continue present dose of diuretic.  We discussed the importance of fluid restriction and low-sodium diet.  Check potassium and renal function.  2 permanent atrial fibrillation-patient's heart rate remains controlled on no medications.  We will continue apixaban.  Check hemoglobin and renal function.  3 cardiomyopathy-this has been felt to be likely nonischemic as previous nuclear study did not show ischemia.  We have managed conservatively given his age.   Continue ARB.  No beta-blocker given history of bradycardia.  4 hypertension-patient's blood pressure is controlled.  Continue present medications and follow.  5 history of right pleural effusion-felt possibly secondary to CHF.  Continue Lasix at present dose.  Kirk Ruths, MD

## 2018-04-01 ENCOUNTER — Other Ambulatory Visit: Payer: Self-pay | Admitting: Cardiology

## 2018-04-04 ENCOUNTER — Ambulatory Visit: Payer: Medicare Other | Admitting: Cardiology

## 2018-04-04 ENCOUNTER — Encounter: Payer: Self-pay | Admitting: Cardiology

## 2018-04-04 VITALS — BP 140/80 | HR 80 | Ht 70.0 in | Wt 178.0 lb

## 2018-04-04 DIAGNOSIS — I5022 Chronic systolic (congestive) heart failure: Secondary | ICD-10-CM | POA: Diagnosis not present

## 2018-04-04 DIAGNOSIS — I4821 Permanent atrial fibrillation: Secondary | ICD-10-CM

## 2018-04-04 DIAGNOSIS — I10 Essential (primary) hypertension: Secondary | ICD-10-CM | POA: Diagnosis not present

## 2018-04-04 DIAGNOSIS — I428 Other cardiomyopathies: Secondary | ICD-10-CM | POA: Diagnosis not present

## 2018-04-04 NOTE — Patient Instructions (Signed)
Medication Instructions:  NO CHANGE If you need a refill on your cardiac medications before your next appointment, please call your pharmacy.   Lab work: Your physician recommends that you HAVE LAB WORK TODAY If you have labs (blood work) drawn today and your tests are completely normal, you will receive your results only by: . MyChart Message (if you have MyChart) OR . A paper copy in the mail If you have any lab test that is abnormal or we need to change your treatment, we will call you to review the results.  Follow-Up: At [facility], you and your health needs are our priority.  As part of our continuing mission to provide you with exceptional heart care, we have created designated Provider Care Teams.  These Care Teams include your primary Cardiologist (physician) and Advanced Practice Providers (APPs -  Physician Assistants and Nurse Practitioners) who all work together to provide you with the care you need, when you need it. You will need a follow up appointment in 6 months.  Please call our office 2 months in advance to schedule this appointment.  You may see BRIAN CRENSHAW MD or one of the following Advanced Practice Providers on your designated Care Team:   Luke Kilroy, PA-C Krista Kroeger, PA-C . Callie Goodrich, PA-C     

## 2018-04-05 ENCOUNTER — Encounter: Payer: Self-pay | Admitting: *Deleted

## 2018-04-05 LAB — CBC
Hematocrit: 35.4 % — ABNORMAL LOW (ref 37.5–51.0)
Hemoglobin: 11.8 g/dL — ABNORMAL LOW (ref 13.0–17.7)
MCH: 30.5 pg (ref 26.6–33.0)
MCHC: 33.3 g/dL (ref 31.5–35.7)
MCV: 92 fL (ref 79–97)
Platelets: 154 10*3/uL (ref 150–450)
RBC: 3.87 x10E6/uL — AB (ref 4.14–5.80)
RDW: 13.4 % (ref 12.3–15.4)
WBC: 4.5 10*3/uL (ref 3.4–10.8)

## 2018-04-05 LAB — BASIC METABOLIC PANEL
BUN/Creatinine Ratio: 13 (ref 10–24)
BUN: 21 mg/dL (ref 10–36)
CHLORIDE: 99 mmol/L (ref 96–106)
CO2: 21 mmol/L (ref 20–29)
CREATININE: 1.57 mg/dL — AB (ref 0.76–1.27)
Calcium: 9 mg/dL (ref 8.6–10.2)
GFR calc Af Amer: 43 mL/min/{1.73_m2} — ABNORMAL LOW (ref >59)
GFR calc non Af Amer: 37 mL/min/{1.73_m2} — ABNORMAL LOW (ref >59)
GLUCOSE: 120 mg/dL — AB (ref 65–99)
Potassium: 4.1 mmol/L (ref 3.5–5.2)
SODIUM: 138 mmol/L (ref 134–144)

## 2018-04-23 IMAGING — DX DG CHEST 1V
1 series · 1 of 1 positions shown · non-contrast
Comparison: 11/27/2016

CLINICAL DATA: Status post right thoracentesis

EXAM:
CHEST 1 VIEW

[chest pa]
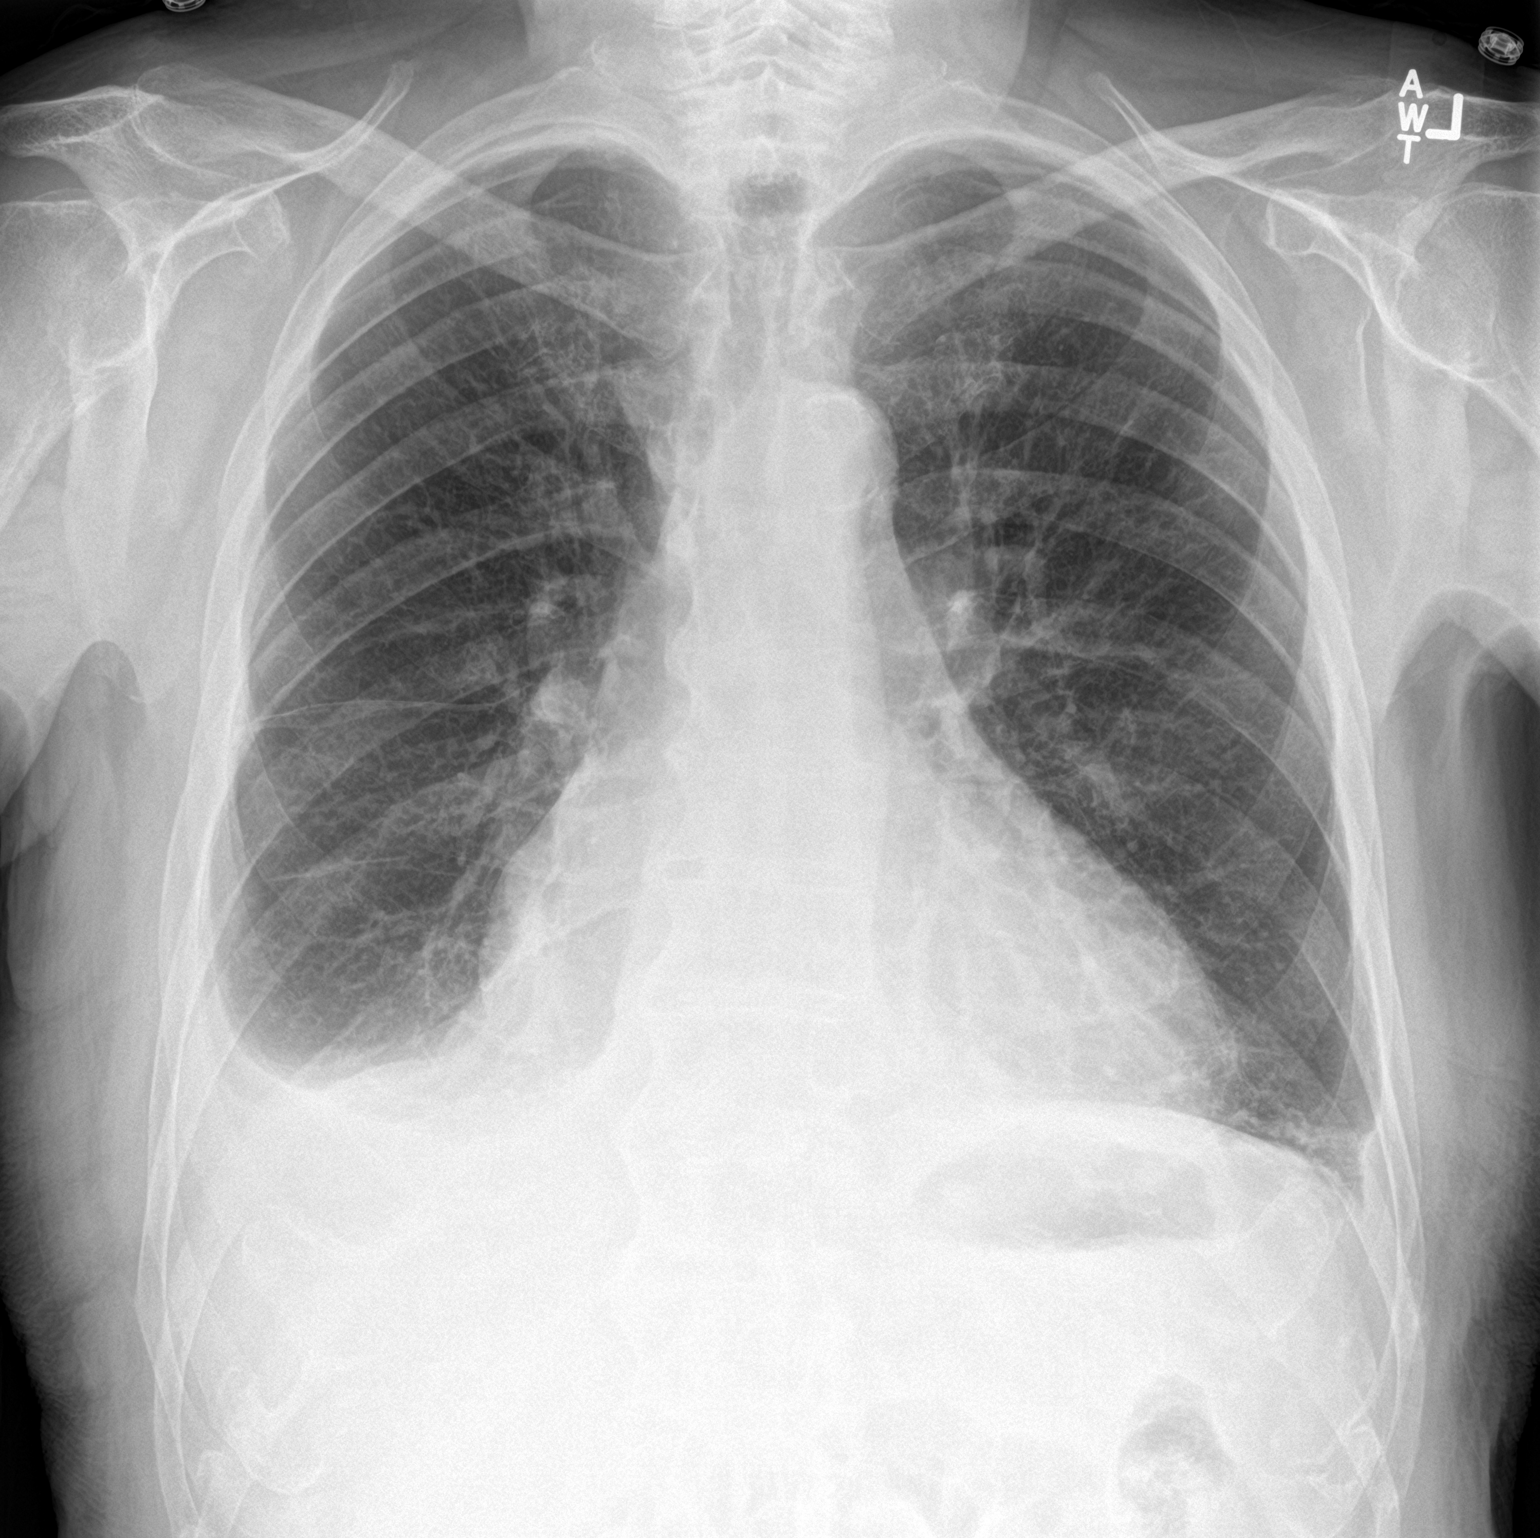

[1 of 1 positions shown; findings below may reference images not displayed]

FINDINGS: Moderate cardiac enlargement. Aortic atherosclerosis. There is been
interval decrease in volume of the right pleural effusion status
post thoracentesis. No pneumothorax identified.
IMPRESSION: 1. No complications after right-sided thoracentesis.

## 2018-05-22 ENCOUNTER — Other Ambulatory Visit: Payer: Self-pay | Admitting: Cardiology

## 2018-05-23 NOTE — Telephone Encounter (Signed)
Rx request sent to pharmacy.  

## 2018-06-19 ENCOUNTER — Other Ambulatory Visit: Payer: Self-pay | Admitting: Cardiology

## 2018-07-29 ENCOUNTER — Other Ambulatory Visit: Payer: Self-pay | Admitting: Cardiology

## 2018-07-29 NOTE — Telephone Encounter (Signed)
85yo, 80.7kg, Scr 1.57 from 04/04/18 Last OV 04/04/18 Indication afib

## 2018-08-20 ENCOUNTER — Other Ambulatory Visit: Payer: Self-pay | Admitting: Cardiology

## 2018-08-20 NOTE — Telephone Encounter (Signed)
Furosemide 20 mg refilled. 

## 2018-09-08 ENCOUNTER — Other Ambulatory Visit: Payer: Self-pay | Admitting: Cardiology

## 2018-11-04 ENCOUNTER — Other Ambulatory Visit: Payer: Self-pay | Admitting: Cardiology

## 2018-11-29 ENCOUNTER — Telehealth: Payer: Self-pay | Admitting: *Deleted

## 2018-11-29 NOTE — Telephone Encounter (Signed)
A message was left, re: follow up visit. 

## 2018-12-13 ENCOUNTER — Encounter: Payer: Self-pay | Admitting: Cardiology

## 2018-12-13 ENCOUNTER — Ambulatory Visit (INDEPENDENT_AMBULATORY_CARE_PROVIDER_SITE_OTHER): Payer: Medicare Other | Admitting: Cardiology

## 2018-12-13 ENCOUNTER — Other Ambulatory Visit: Payer: Self-pay

## 2018-12-13 VITALS — BP 146/83 | HR 73 | Temp 97.4°F | Ht 70.5 in | Wt 187.4 lb

## 2018-12-13 DIAGNOSIS — I5022 Chronic systolic (congestive) heart failure: Secondary | ICD-10-CM

## 2018-12-13 DIAGNOSIS — J90 Pleural effusion, not elsewhere classified: Secondary | ICD-10-CM

## 2018-12-13 DIAGNOSIS — I4821 Permanent atrial fibrillation: Secondary | ICD-10-CM | POA: Diagnosis not present

## 2018-12-13 DIAGNOSIS — I428 Other cardiomyopathies: Secondary | ICD-10-CM

## 2018-12-13 MED ORDER — FUROSEMIDE 20 MG PO TABS: 40.0000 mg | ORAL_TABLET | Freq: Two times a day (BID) | ORAL | 3 refills | Status: DC

## 2018-12-13 NOTE — Progress Notes (Signed)
HPI: FU atrial fibrillation. We have elected rate control and anticoagulation for afib. Holter monitor in February of 2014 showed sinus rhythm, mobitz 1; toprol DCed. Nuclear study September 2017 showed ejection fraction 28%. No ischemia. Fixed apical defect. Patient had CT scan January 2018 that showed large right and small left pleural effusion. Patient had thoracentesis January 18.Cytology negative.Patient had repeat thoracentesis June 2018.Fluidnot sent for analysis. Echocardiogram February 2019 described as diffuse hypokinesis with ejection fraction similar to study in August 2017 which was 25 to 30%.  There was biatrial enlargement and moderate tricuspid regurgitation.  Since last seen,patient notes increased dyspnea on exertion for several weeks.  He decreased his Lasix to 20 mg daily by himself 3 weeks ago.  He notes increased abdominal girth and ankle edema.  No exertional chest pain or syncope.  Current Outpatient Medications  Medication Sig Dispense Refill  . acetaminophen (TYLENOL) 500 MG tablet Take 1,000 mg by mouth every 6 (six) hours as needed for mild pain or moderate pain.    [name] Dextromethorphan-Guaifenesin (DELSYM COUGH/CHEST CONGEST DM PO) Take 10 mLs by mouth daily as needed (for cough).     . diazepam (VALIUM) 5 MG tablet Take 0.5 tablets (2.5 mg total) by mouth every 8 (eight) hours as needed for anxiety. 10 tablet 0  . ELIQUIS 2.5 MG TABS tablet TAKE 1 TABLET TWICE A DAY 180 tablet 1  . furosemide (LASIX) 20 MG tablet TAKE 1 TABLET TWICE A DAY 180 tablet 1  . isosorbide dinitrate (ISORDIL) 30 MG tablet TAKE 1 TABLET DAILY 90 tablet 3  . losartan (COZAAR) 25 MG tablet TAKE 1 TABLET DAILY 90 tablet 2  . Multiple Vitamins-Minerals (PRESERVISION AREDS 2+MULTI VIT) CAPS Take 1 capsule by mouth daily.     No current facility-administered medications for this visit.      Past Medical History:  Diagnosis Date  . Atrial fibrillation ([place])   . BPH (benign prostatic  hyperplasia)   . Dyspnea on exertion    a. 09/2008 NL EF on myoview.  [name] Hearing difficulty    R sided hole in eardrum, only wears L hearing aid  . History of tobacco abuse   . Hypertension   . Lumbar stenosis with neurogenic claudication    a. 02/2011 s/p decompressive laminectomy.  . Midsternal chest pain    a. 09/2008 Myoview: EF 56%, mild fixed thinning of inf wall particularly @ base -? attenuation.  No ischemia  . PUD (peptic ulcer disease)    Remote, history of  . Urinary hesitancy     Past Surgical History:  Procedure Laterality Date  . HERNIA REPAIR     Left  . LUMBAR LAMINECTOMY/DECOMPRESSION MICRODISCECTOMY  03/04/2011   Procedure: LUMBAR LAMINECTOMY/DECOMPRESSION MICRODISCECTOMY;  Surgeon: Ophelia Charter;  Location: [place] [facility];  Service: Neurosurgery;  Laterality: N/A;  LUMBAR THREE-FOUR ,FOUR-FIVE LAMINECTOMIES  . TYMPANOMASTOIDECTOMY     Left modified radical with type 3 tympanoplasty    Social History   Socioeconomic History  . Marital status: Widowed    Spouse name: Not on file  . Number of children: 2  . Years of education: Not on file  . Highest education level: Not on file  Occupational History  . Occupation: [employer]    Comment: Retired  Scientific laboratory technician  . Financial resource strain: Not on file  . Food insecurity    Worry: Not on file    Inability: Not on file  . Transportation needs  Medical: Not on file    Non-medical: Not on file  Tobacco Use  . Smoking status: Former Research scientist (life sciences)  . Smokeless tobacco: Never Used  . Tobacco comment: Remote history of smoking  Substance and Sexual Activity  . Alcohol use: No  . Drug use: No  . Sexual activity: Not on file  Lifestyle  . Physical activity    Days per week: Not on file    Minutes per session: Not on file  . Stress: Not on file  Relationships  . Social Herbalist on phone: Not on file    Gets together: Not on file    Attends religious service: Not on file    Active  member of club or organization: Not on file    Attends meetings of clubs or organizations: Not on file    Relationship status: Not on file  . Intimate partner violence    Fear of current or ex partner: Not on file    Emotionally abused: Not on file    Physically abused: Not on file    Forced sexual activity: Not on file  Other Topics Concern  . Not on file  Social History Narrative   Lives with his wife in [place].   He swims daily.   He has 2 grown daughters.   1 Caffeine drink daily     Family History  Problem Relation Age of Onset  . Lung cancer Mother   . Appendicitis Father   . Diabetes Sister        DM  . Coronary artery disease Neg Hx        No premature CAD  . Colon cancer Neg Hx     ROS: Weakness and fatigue but no fevers or chills, productive cough, hemoptysis, dysphasia, odynophagia, melena, hematochezia, dysuria, hematuria, rash, seizure activity, orthopnea, PND, claudication. Remaining systems are negative.  Physical Exam: Well-developed frail in no acute distress.  Skin is warm and dry.  HEENT is normal.  Neck is supple.  Chest with diminished BS RLL Cardiovascular exam is irregular Abdominal exam nontender or distended. No masses palpated. Extremities show 1+ ankle edema. neuro grossly intact  ECG-atrial fibrillation at a rate of 73, nonspecific ST changes.  Personally reviewed  A/P  1 chronic systolic congestive heart failure-patient is volume overloaded on examination.  Increase Lasix to 60 mg twice daily for 2 days then 40 mg twice daily.  Check potassium and renal function in 1 week.  I will have him seen by APP in 1 week to reassess.  I will see him back in 3 months.  2 permanent atrial fibrillation-continue apixaban.  Heart rate is controlled on no medications.  Check hemoglobin and renal function.  3 nonischemic cardiomyopathy-we have managed conservatively given patient's age.  Continue ARB.  He is not on a beta-blocker due to history of  bradycardia.  4 hypertension-blood pressure is controlled.  Continue present medications and follow.  5 right pleural effusion-felt possibly secondary to CHF in the past.  Repeat chest x-ray.  Lasix as outlined above.  Kirk Ruths, MD

## 2018-12-13 NOTE — Patient Instructions (Signed)
Medication Instructions:  INCREASE FUROSEMIDE TO 60 MG TWICE DAILY FOR 2 DAYS AND THEN TAKE 40 MG TWICE DAILY= 3 OF THE 20 MG TABLETS TWICE DAILY FOR 2 DAYS AND THEN TAKE 2 OF THE 40 MG TABLETS TWICE DAILY  If you need a refill on your cardiac medications before your next appointment, please call your pharmacy.   Lab work: Your physician recommends that you return for lab work in: [place]  If you have labs (blood work) drawn today and your tests are completely normal, you will receive your results only by: [name] MyChart Message (if you have MyChart) OR . A paper copy in the mail If you have any lab test that is abnormal or we need to change your treatment, we will call you to review the results.  Testing/Procedures: A chest x-ray takes a picture of the organs and structures inside the chest, including the heart, lungs, and blood vessels. This test can show several things, including, whether the heart is enlarges; whether fluid is building up in the lungs; and whether pacemaker / defibrillator leads are still in place. [place]  Follow-Up: At [facility], you and your health needs are our priority.  As part of our continuing mission to provide you with exceptional heart care, we have created designated Provider Care Teams.  These Care Teams include your primary Cardiologist (physician) and Advanced Practice Providers (APPs -  Physician Assistants and Nurse Practitioners) who all work together to provide you with the care you need, when you need it. Your physician recommends that you schedule a follow-up appointment in: [place] physician recommends that you schedule a follow-up appointment in: [place]

## 2018-12-14 MED ORDER — POTASSIUM CHLORIDE CRYS ER 20 MEQ PO TBCR: 20.0000 meq | EXTENDED_RELEASE_TABLET | Freq: Every day | ORAL | 3 refills | Status: DC

## 2018-12-20 NOTE — Progress Notes (Signed)
Cardiology Office Note   Date:  12/21/2018   ID:  Ralph Mendoza, DOB 1920-06-13, MRN [id number]  PCP:  [name], MD  Cardiologist:  Stanford Breed  CC: Follow Up    History of Present Illness: Ralph Mendoza is a 85 y.o. male who presents for ongoing assessment and management of atrial fibrillation on apixaban,, patient had a CT scan January 2018 that showed a large right and small left pleural effusion, subsequently having thoracentesis on May 07, 2016.  Cytology was negative.    The patient had a repeat thoracentesis in June 2018.  Repeat echocardiogram in February 2019 revealed diffuse hypokinesis with ejection fraction similar to study in August 2017 which revealed an EF of 25% to 30%.  He was last seen by Dr. Stanford Breed on 12/13/2018 with complaints of increased dyspnea on exertion for several weeks.  At the time of that office visit Lasix was increased to 60 mg twice daily for 2 days and then 40 mg twice daily.  Follow-up BMET for renal function was to be completed in 1 week.  He is here for follow-up to discuss his symptoms and response to medications.  The patient comes today having lost 13 pounds.  He complains of constipation.  He also states that he is not urinating as much as he used to.  He remains frail.  He is not adhering to a low-sodium diet.  He continues to eat a lot of cold cuts.  He currently lives at [facility], but is able to use a power wheelchair to go up to [employer] and get some fast food or cold cuts to eat during the day.  He denies chest pain or dyspnea.  Past Medical History:  Diagnosis Date  . Atrial fibrillation ([place])   . BPH (benign prostatic hyperplasia)   . Dyspnea on exertion    a. 09/2008 NL EF on myoview.  [name] Hearing difficulty    R sided hole in eardrum, only wears L hearing aid  . History of tobacco abuse   . Hypertension   . Lumbar stenosis with neurogenic claudication    a. 02/2011 s/p decompressive laminectomy.  . Midsternal  chest pain    a. 09/2008 Myoview: EF 56%, mild fixed thinning of inf wall particularly @ base -? attenuation.  No ischemia  . PUD (peptic ulcer disease)    Remote, history of  . Urinary hesitancy     Past Surgical History:  Procedure Laterality Date  . HERNIA REPAIR     Left  . LUMBAR LAMINECTOMY/DECOMPRESSION MICRODISCECTOMY  03/04/2011   Procedure: LUMBAR LAMINECTOMY/DECOMPRESSION MICRODISCECTOMY;  Surgeon: Ophelia Charter;  Location: [place] [facility];  Service: Neurosurgery;  Laterality: N/A;  LUMBAR THREE-FOUR ,FOUR-FIVE LAMINECTOMIES  . TYMPANOMASTOIDECTOMY     Left modified radical with type 3 tympanoplasty     Current Outpatient Medications  Medication Sig Dispense Refill  . acetaminophen (TYLENOL) 500 MG tablet Take 1,000 mg by mouth every 6 (six) hours as needed for mild pain or moderate pain.    [name] Dextromethorphan-Guaifenesin (DELSYM COUGH/CHEST CONGEST DM PO) Take 10 mLs by mouth daily as needed (for cough).     . diazepam (VALIUM) 5 MG tablet Take 0.5 tablets (2.5 mg total) by mouth every 8 (eight) hours as needed for anxiety. 10 tablet 0  . ELIQUIS 2.5 MG TABS tablet TAKE 1 TABLET TWICE A DAY 180 tablet 1  . furosemide (LASIX) 20 MG tablet Take 40 mg by mouth daily.    . isosorbide  dinitrate (ISORDIL) 30 MG tablet TAKE 1 TABLET DAILY 90 tablet 3  . losartan (COZAAR) 25 MG tablet TAKE 1 TABLET DAILY 90 tablet 2  . Multiple Vitamins-Minerals (PRESERVISION AREDS 2+MULTI VIT) CAPS Take 1 capsule by mouth daily.    . potassium chloride SA (K-DUR) 20 MEQ tablet Take 1 tablet (20 mEq total) by mouth daily. 90 tablet 3   No current facility-administered medications for this visit.     Allergies:   Patient has no known allergies.    Social History:  The patient  reports that he has quit smoking. He has never used smokeless tobacco. He reports that he does not drink alcohol or use drugs.   Family History:  The patient's family history includes Appendicitis in his father;  Diabetes in his sister; Lung cancer in his mother.    ROS: All other systems are reviewed and negative. Unless otherwise mentioned in H&P    PHYSICAL EXAM: VS:  BP 114/70 (BP Location: Left Arm, Patient Position: Sitting, Cuff Size: Normal)   Pulse 79   Temp (!) 95.2 F (35.1 C)   Ht 5\' 10"  (1.778 m)   Wt 173 lb (78.5 kg)   BMI 24.82 kg/m  , BMI Body mass index is 24.82 kg/m. GEN: Well nourished, well developed, in no acute distress HEENT: normal Neck: no JVD, carotid bruits, or masses Cardiac: RRR; no murmurs, rubs, or gallops,no edema  Respiratory:  Clear to auscultation bilaterally, normal work of breathing GI: soft, nontender, nondistended, + BS MS: no deformity or atrophy Skin: warm and dry, no rash Neuro:  Strength and sensation are intact Psych: euthymic mood, full affect   EKG: Not completed this office visit  Recent Labs: 04/04/2018: BUN 21; Creatinine, Ser 1.57; Hemoglobin 11.8; Platelets 154; Potassium 4.1; Sodium 138    Lipid Panel No results found for: CHOL, TRIG, HDL, CHOLHDL, VLDL, LDLCALC, LDLDIRECT    Wt Readings from Last 3 Encounters:  12/21/18 173 lb (78.5 kg)  12/13/18 187 lb 6.4 oz (85 kg)  04/04/18 178 lb (80.7 kg)      Other studies Reviewed:  Echocardiogram 05/30/2017  Left ventricle: EF similar to August 2017 diffuse hypokinesis   worse in inferior wall. The cavity size was moderately dilated.   Wall thickness was normal. The study is not technically   sufficient to allow evaluation of LV diastolic function. - Aortic valve: There was trivial regurgitation. - Left atrium: The atrium was mildly dilated. - Right ventricle: The cavity size was moderately dilated. - Right atrium: The atrium was mildly dilated. - Atrial septum: No defect or patent foramen ovale was identified. - Tricuspid valve: There was moderate regurgitation. - Pulmonary arteries: PA peak pressure: 52 mm Hg (S).   ASSESSMENT AND PLAN:  1.  Acute on chronic systolic  heart failure: The patient has diuresed 13 pounds since being seen last in the office with increased dose of Lasix 40 mg twice daily.  Patient EF is 25% to 30%.  The patient feels tired and weak and very constipated.  I am going to decrease his Lasix to 40 mg daily.  He is to get a scale if he begins to gain 3 to 5 pounds within the next couple days we will increase his Lasix to 60 mg in the morning.    His son states that he was not taking his afternoon doses of Lasix in the past which led to fluid retention.  He was on 20 mg twice daily.  My plan is  to keep him on at least 40 mg a day but if he begins to gain weight we will go up to 60 mg a day.  The patient is given instructions to take Colace as needed at at bedtime for constipation.  He is given low-sodium diet instructions.  I will check a BMET and a CBC today for ongoing surveillance of kidney function and hemoglobin hematocrit status on anticoagulation.  2. Atrial Fibrillation: Heart rate is very well controlled. He will remain on Eliquis. He will have a CBC completed.   3. Hypertension: Blood pressures well controlled currently.  No changes in his regimen at this time.   Current medicines are reviewed at length with the patient today.    Labs/ tests ordered today include: BMET, CBC.  Phill Myron. West Pugh, ANP, AACC   12/21/2018 3:21 PM    [place] [address] Office [phone] Fax [phone]

## 2018-12-21 ENCOUNTER — Other Ambulatory Visit: Payer: Self-pay

## 2018-12-21 ENCOUNTER — Ambulatory Visit
Admission: RE | Admit: 2018-12-21 | Discharge: 2018-12-21 | Disposition: A | Discharge status: Home / Self Care | Payer: Medicare Other | Source: Ambulatory Visit | Attending: Cardiology | Admitting: Cardiology

## 2018-12-21 ENCOUNTER — Encounter: Payer: Self-pay | Admitting: Adult Health

## 2018-12-21 ENCOUNTER — Ambulatory Visit (INDEPENDENT_AMBULATORY_CARE_PROVIDER_SITE_OTHER): Payer: Medicare Other | Admitting: Adult Health

## 2018-12-21 VITALS — BP 114/70 | HR 79 | Temp 95.2°F | Ht 70.0 in | Wt 173.0 lb

## 2018-12-21 DIAGNOSIS — J90 Pleural effusion, not elsewhere classified: Secondary | ICD-10-CM

## 2018-12-21 DIAGNOSIS — Z79899 Other long term (current) drug therapy: Secondary | ICD-10-CM

## 2018-12-21 NOTE — Patient Instructions (Addendum)
Medication Instructions:  DECREASE- Furosemide 20 mg take 2 tablets daily  If you need a refill on your cardiac medications before your next appointment, please call your pharmacy.  Labwork: BMP and CBC HERE IN OUR OFFICE AT [facility]  You will NOT need to fast   Take the provided lab slips with you to the lab for your blood draw.   When you have your labs (blood work) drawn today and your tests are completely normal, you will receive your results only by MyChart Message (if you have MyChart) -OR-  A paper copy in the mail.  If you have any lab test that is abnormal or we need to change your treatment, we will call you to review these results.  Testing/Procedures: None Ordered  Special Instructions: Take an over the counter Colace at bedtime as needed  Follow-Up: .   Keep appointment with Dr Stanford Breed December 1st @ 4:00 pm   At [facility], you and your health needs are our priority.  As part of our continuing mission to provide you with exceptional heart care, we have created designated Provider Care Teams.  These Care Teams include your primary Cardiologist (physician) and Advanced Practice Providers (APPs -  Physician Assistants and Nurse Practitioners) who all work together to provide you with the care you need, when you need it.  Thank you for choosing [facility] at [facility]!!

## 2018-12-22 LAB — BASIC METABOLIC PANEL
BUN/Creatinine Ratio: 13 (ref 10–24)
BUN: 20 mg/dL (ref 10–36)
CO2: 25 mmol/L (ref 20–29)
Calcium: 9 mg/dL (ref 8.6–10.2)
Chloride: 92 mmol/L — ABNORMAL LOW (ref 96–106)
Creatinine, Ser: 1.55 mg/dL — ABNORMAL HIGH (ref 0.76–1.27)
GFR calc Af Amer: 43 mL/min/{1.73_m2} — ABNORMAL LOW (ref >59)
GFR calc non Af Amer: 37 mL/min/{1.73_m2} — ABNORMAL LOW (ref >59)
Glucose: 86 mg/dL (ref 65–99)
Potassium: 4.1 mmol/L (ref 3.5–5.2)
Sodium: 135 mmol/L (ref 134–144)

## 2018-12-22 LAB — CBC
Hematocrit: 42.2 % (ref 37.5–51.0)
Hemoglobin: 13.7 g/dL (ref 13.0–17.7)
MCH: 29.7 pg (ref 26.6–33.0)
MCHC: 32.5 g/dL (ref 31.5–35.7)
MCV: 92 fL (ref 79–97)
Platelets: 176 10*3/uL (ref 150–450)
RBC: 4.61 x10E6/uL (ref 4.14–5.80)
RDW: 13.4 % (ref 11.6–15.4)
WBC: 6.3 10*3/uL (ref 3.4–10.8)

## 2018-12-23 IMAGING — DX DG CHEST 2V
2 series · 2 of 2 positions shown · non-contrast
Comparison: 05/28/2017

CLINICAL DATA: Cough

EXAM:
CHEST  2 VIEW

[chest pa]
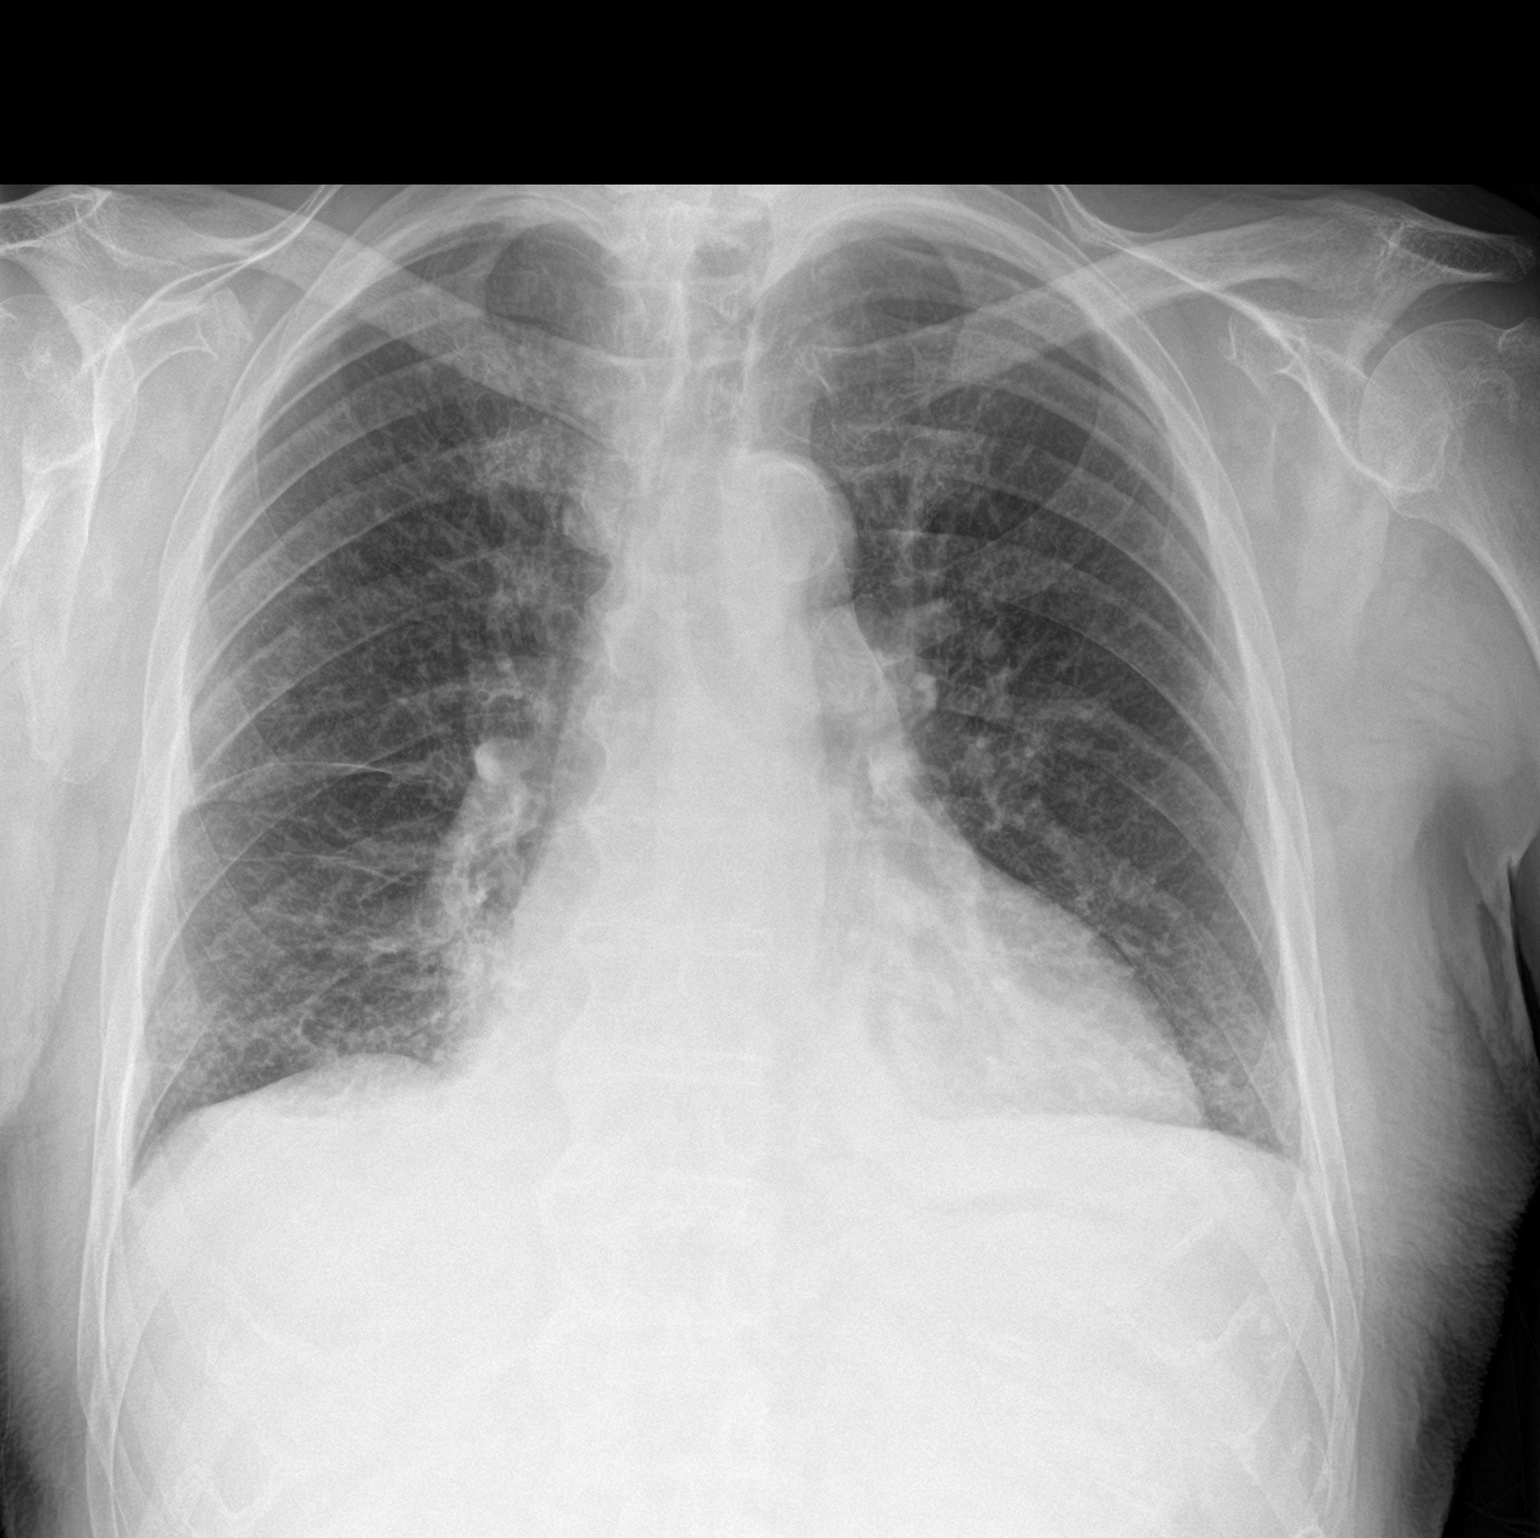

[chest lat]
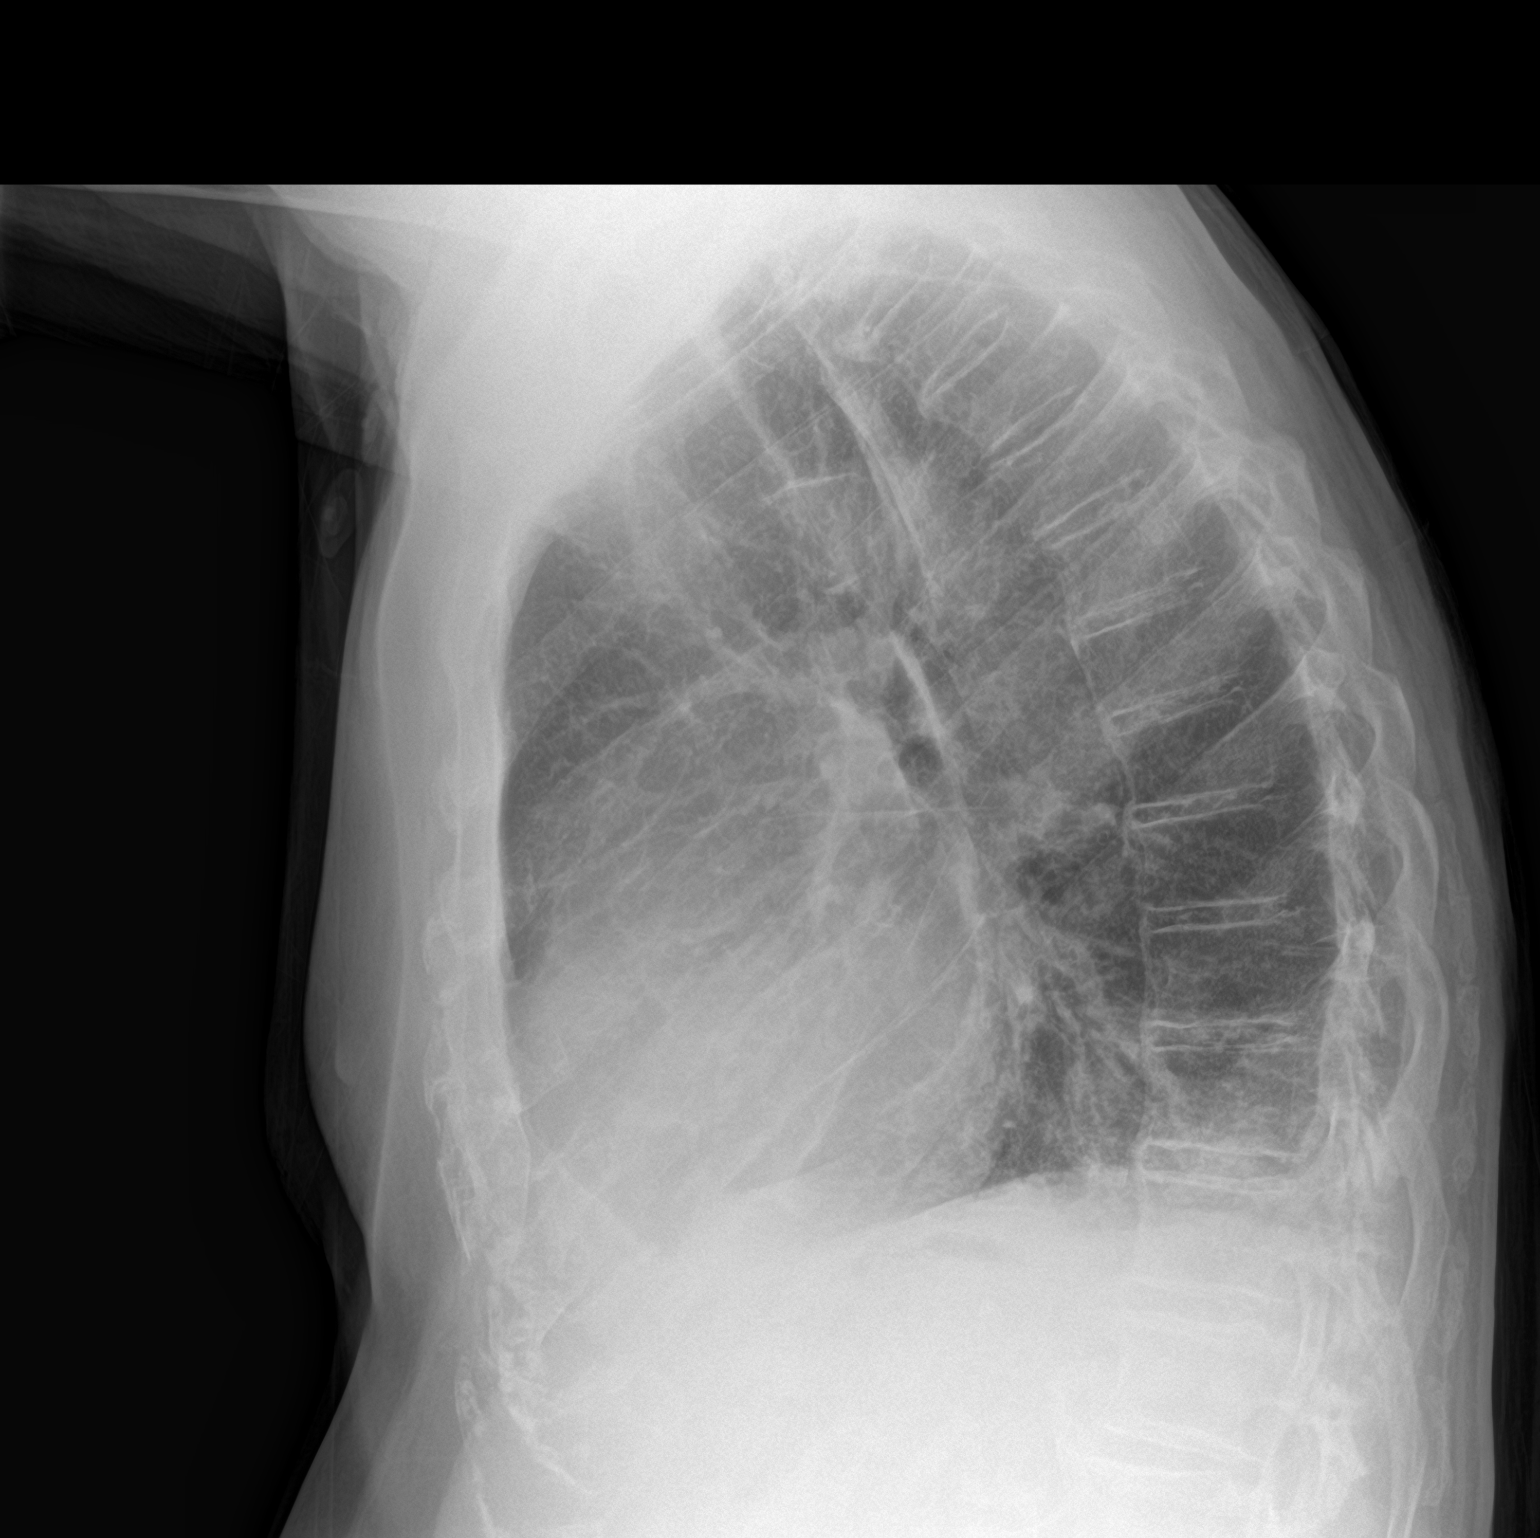

[2 of 2 positions shown; findings below may reference images not displayed]

FINDINGS: Cardiomegaly. Bibasilar atelectasis. No significant effusions or
acute bony abnormality. No overt edema.
IMPRESSION: Cardiomegaly.  Bibasilar atelectasis.

## 2018-12-24 ENCOUNTER — Other Ambulatory Visit: Payer: Self-pay | Admitting: Cardiology

## 2018-12-27 ENCOUNTER — Telehealth: Payer: Self-pay | Admitting: *Deleted

## 2018-12-27 DIAGNOSIS — Z79899 Other long term (current) drug therapy: Secondary | ICD-10-CM

## 2018-12-27 NOTE — Telephone Encounter (Signed)
-----   Message from Lendon Colonel, NP sent at 12/22/2018  8:03 AM EDT ----- Labs are reviewed. Improvement in anemia, creatinine is about the same despite higher doses of lasix recently. Will need to repeat the BMET in one month. If he gains 3-5 lbs he is to take additional 20 mg of lasix. He is currently on 40 mg of lasix daily.

## 2018-12-27 NOTE — Telephone Encounter (Signed)
BMP ordered and lab slip mailed to pt. 

## 2019-01-16 ENCOUNTER — Other Ambulatory Visit: Payer: Self-pay | Admitting: Otolaryngology

## 2019-01-16 DIAGNOSIS — J3489 Other specified disorders of nose and nasal sinuses: Secondary | ICD-10-CM

## 2019-01-16 DIAGNOSIS — D385 Neoplasm of uncertain behavior of other respiratory organs: Secondary | ICD-10-CM

## 2019-01-17 ENCOUNTER — Other Ambulatory Visit: Payer: Self-pay | Admitting: Otolaryngology

## 2019-01-17 DIAGNOSIS — D385 Neoplasm of uncertain behavior of other respiratory organs: Secondary | ICD-10-CM

## 2019-01-18 ENCOUNTER — Other Ambulatory Visit: Payer: Medicare Other

## 2019-01-20 ENCOUNTER — Ambulatory Visit
Admission: RE | Admit: 2019-01-20 | Discharge: 2019-01-20 | Disposition: A | Discharge status: Home / Self Care | Payer: Medicare Other | Source: Ambulatory Visit | Attending: Otolaryngology | Admitting: Otolaryngology

## 2019-01-20 DIAGNOSIS — D385 Neoplasm of uncertain behavior of other respiratory organs: Secondary | ICD-10-CM

## 2019-01-26 ENCOUNTER — Other Ambulatory Visit (HOSPITAL_COMMUNITY)
Admission: RE | Admit: 2019-01-26 | Discharge: 2019-01-26 | Disposition: A | Discharge status: Home / Self Care | Payer: Medicare Other | Source: Ambulatory Visit | Attending: Otolaryngology | Admitting: Otolaryngology

## 2019-01-26 ENCOUNTER — Other Ambulatory Visit: Payer: Self-pay | Admitting: Otolaryngology

## 2019-01-26 DIAGNOSIS — D385 Neoplasm of uncertain behavior of other respiratory organs: Secondary | ICD-10-CM | POA: Diagnosis present

## 2019-01-31 ENCOUNTER — Other Ambulatory Visit (HOSPITAL_COMMUNITY): Payer: Self-pay | Admitting: Otolaryngology

## 2019-01-31 DIAGNOSIS — J3489 Other specified disorders of nose and nasal sinuses: Secondary | ICD-10-CM

## 2019-02-02 ENCOUNTER — Telehealth: Payer: Self-pay | Admitting: Cardiology

## 2019-02-02 ENCOUNTER — Encounter (HOSPITAL_COMMUNITY): Payer: Self-pay

## 2019-02-02 NOTE — Telephone Encounter (Signed)
Pt takes Eliquis 2.5mg  BID for afib with CHADS2VASc score of 4 (age x2, CHF, HTN). SCr 1.55, CrCl 73mL/min. Ok to hold Eliquis for 2 days prior to biopsy.

## 2019-02-02 NOTE — Telephone Encounter (Signed)
° °  [facility] Pre-operative Risk Assessment    Request for surgical clearance:  1. What type of surgery is being performed? Lymph nodes biopsy  2. When is this surgery scheduled? 02/10/19  3. What type of clearance is required (medical clearance vs. Pharmacy clearance to hold med vs. Both)? meds  4. Are there any medications that need to be held prior to surgery and how long? ELIQUIS How long   5. Practice name and name of physician performing surgery? [facility] / Dr. Pascal Lux    6. What is your office phone number Marianna Fuss, [phone]    7.   What is your office fax number [phone] or [phone]  8.   Anesthesia type (None, local, MAC, general) ? twilight sedation   Ermelinda Das 02/02/2019, 11:31 AM  _________________________________________________________________   (provider comments below)

## 2019-02-02 NOTE — Progress Notes (Signed)
Ralph Mendoza. Ralph Mendoza Male, 85 y.o., April 15, 1921 MRN:  [id number] Phone:  [phone] (H) PCP:  [name], MD Primary Cvg:  [place] [facility] Next Appt With Radiology (MC-US 2) 02/10/2019 at 2:00 PM  RE: Blood thinners/Biopsy Received: Today Message Contents  Crenshaw, Denice Bors, MD  [facility]  Hold apixaban 2 days prior to procedure and resume after when ok with surgery  Kirk Ruths   Previous Messages  ----- Message -----  From: Lenore Cordia  Sent: 02/02/2019 11:16 AM EDT  To: Lelon Perla, MD  Subject: Blood thinners/Biopsy               Hello Dr Stanford Breed,   I have scheduled you patient Ralph Mendoza for a Biopsy  He is on Eliquis and need to hold it for two days prior to Biopsy  His biopsy is scheduled for 02/10/2019.   Your permission is needed.   Thank You  [place] Amon

## 2019-02-02 NOTE — Progress Notes (Signed)
Ralph Mendoza [name] Male, 85 y.o., Feb 09, 1921 MRN:  [id number] Phone:  [phone] (H) PCP:  [name], MD Primary Cvg:  [place] With Radiology (MC-US 2) 02/10/2019 at 2:00 PM  RE: Biopsy Received: Yesterday Message Contents  Aletta Edouard, MD  [facility]  Discussed with Dr. Lucia Gaskins. Palpable mass breaking through wall of right max antrum. FNA in office susp for lymphoma but not diagnostic. Need core bx in saline for lymphoma workup. Set up for conscious sedation. Since unusual case, try to set up w/ myself, [place] or [facility] since we all looked at case. Book in Ultrasound at [facility] or [facility].   GY   Previous Messages  ----- Message -----  From: Lenore Cordia  Sent: 01/31/2019  4:59 PM EDT  To: Ir Procedure Requests  Subject: Biopsy                      Procedure Requested: Core Needle Biopsy    Reason for Procedure: evaluate rt. Max sinus mass for lymphoma    Provider Requesting: Myrtie Neither. Lucia Gaskins, MD  Provider Telephone: [phone]    Other Info: Rad exam in Epic

## 2019-02-02 NOTE — Telephone Encounter (Signed)
   Primary Cardiologist: Kirk Ruths, MD  Clinical pharmacist reviewed the chart.  OK to hold Eliquis x 2 days prior to biopsy.  I will route this recommendation to the requesting party via Epic fax function and remove from pre-op pool.  Please let patient know.   Please call with questions.  Eriyana Sweeten, PA-C 02/02/2019, 4:55 PM   5. [facility] / Dr. Pascal Lux    6. What is your office phone number Marianna Fuss, [phone]    7.   What is your office fax number [phone] or [phone]

## 2019-02-03 LAB — CYTOLOGY - NON PAP

## 2019-02-09 ENCOUNTER — Other Ambulatory Visit: Payer: Self-pay | Admitting: Radiology

## 2019-02-09 ENCOUNTER — Other Ambulatory Visit: Payer: Self-pay | Admitting: Student

## 2019-02-10 ENCOUNTER — Ambulatory Visit (HOSPITAL_COMMUNITY)
Admission: RE | Admit: 2019-02-10 | Discharge: 2019-02-10 | Disposition: A | Discharge status: Home / Self Care | Payer: Medicare Other | Source: Ambulatory Visit | Attending: Otolaryngology | Admitting: Otolaryngology

## 2019-02-10 ENCOUNTER — Other Ambulatory Visit: Payer: Self-pay

## 2019-02-10 DIAGNOSIS — I509 Heart failure, unspecified: Secondary | ICD-10-CM | POA: Diagnosis not present

## 2019-02-10 DIAGNOSIS — N189 Chronic kidney disease, unspecified: Secondary | ICD-10-CM | POA: Insufficient documentation

## 2019-02-10 DIAGNOSIS — J3489 Other specified disorders of nose and nasal sinuses: Secondary | ICD-10-CM | POA: Diagnosis present

## 2019-02-10 DIAGNOSIS — Z7901 Long term (current) use of anticoagulants: Secondary | ICD-10-CM | POA: Diagnosis not present

## 2019-02-10 DIAGNOSIS — Z79899 Other long term (current) drug therapy: Secondary | ICD-10-CM | POA: Diagnosis not present

## 2019-02-10 DIAGNOSIS — I4891 Unspecified atrial fibrillation: Secondary | ICD-10-CM | POA: Diagnosis not present

## 2019-02-10 DIAGNOSIS — I130 Hypertensive heart and chronic kidney disease with heart failure and stage 1 through stage 4 chronic kidney disease, or unspecified chronic kidney disease: Secondary | ICD-10-CM | POA: Diagnosis not present

## 2019-02-10 DIAGNOSIS — Z87891 Personal history of nicotine dependence: Secondary | ICD-10-CM | POA: Insufficient documentation

## 2019-02-10 LAB — CBC
HCT: 40.1 % (ref 39.0–52.0)
Hemoglobin: 13.2 g/dL (ref 13.0–17.0)
MCH: 30.6 pg (ref 26.0–34.0)
MCHC: 32.9 g/dL (ref 30.0–36.0)
MCV: 93 fL (ref 80.0–100.0)
Platelets: 178 10*3/uL (ref 150–400)
RBC: 4.31 MIL/uL (ref 4.22–5.81)
RDW: 15.6 % — ABNORMAL HIGH (ref 11.5–15.5)
WBC: 4.9 10*3/uL (ref 4.0–10.5)
nRBC: 0 % (ref 0.0–0.2)

## 2019-02-10 LAB — PROTIME-INR
INR: 1 (ref 0.8–1.2)
Prothrombin Time: 13.1 seconds (ref 11.4–15.2)

## 2019-02-10 MED ORDER — FENTANYL CITRATE (PF) 100 MCG/2ML IJ SOLN
INTRAMUSCULAR | Status: AC | PRN
  Administered 2019-02-10 (×2): 12.5 ug via INTRAVENOUS

## 2019-02-10 MED ORDER — SODIUM CHLORIDE 0.9 % IV SOLN: INTRAVENOUS | Status: DC

## 2019-02-10 MED ORDER — HYDROMORPHONE HCL 1 MG/ML IJ SOLN: INTRAMUSCULAR | Status: AC | PRN

## 2019-02-10 MED ORDER — MIDAZOLAM HCL 2 MG/2ML IJ SOLN
INTRAMUSCULAR | Status: AC | PRN
  Administered 2019-02-10: 0.5 mg via INTRAVENOUS

## 2019-02-10 MED ORDER — LIDOCAINE-EPINEPHRINE 1 %-1:100000 IJ SOLN
INTRAMUSCULAR | Status: AC
  Filled 2019-02-10: qty 1

## 2019-02-10 MED ORDER — MIDAZOLAM HCL 2 MG/2ML IJ SOLN
INTRAMUSCULAR | Status: AC
  Filled 2019-02-10: qty 2

## 2019-02-10 MED ORDER — LIDOCAINE HCL (PF) 1 % IJ SOLN
INTRAMUSCULAR | Status: AC
  Filled 2019-02-10: qty 30

## 2019-02-10 MED ORDER — FENTANYL CITRATE (PF) 100 MCG/2ML IJ SOLN
INTRAMUSCULAR | Status: AC
  Filled 2019-02-10: qty 2

## 2019-02-10 NOTE — Procedures (Signed)
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   Ronny Bacon, MD Pager #: [phone]

## 2019-02-10 NOTE — H&P (Signed)
Chief Complaint: Patient was seen in consultation today for right maxillary sinus biopsy.  Referring Physician(s): Newman,Christopher E  Supervising Physician: Sandi Mariscal  Patient Status: [facility] - Out-pt  History of Present Illness: Ralph Mendoza is a 85 y.o. 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 Ralph Mendoza was seen by Dr. Lucia Mendoza (ENT) earlier this month due a hard lump on the right side of his face at the maxillary sinus that had been enlarging the past 1-2 months. He underwent a CT soft tissue neck and maxillofacial w/o contrast on 01/22/19 which showed a 5.6 cm mass completely opacifying the right maxillary sinus with multifocal osseous erosion and extension beyond the right maxillary sinus, no cervical lymphadenopathy. He also underwent FNA of this area in the ENT office, pathology of which was suspicious for lymphoma but not diagnostic. A request was made to IR for a core biopsy of this area to further guide management.  Ralph Mendoza reports he has been feeling fine, he denies any complaints except that his eyesight and hearing are not very good - stating, "when you get old the wheels just fall off." He states understanding of the requested procedure and wishes to proceed.  Past Medical History:  Diagnosis Date   Atrial fibrillation (HCC)    BPH (benign prostatic hyperplasia)    Dyspnea on exertion    a. 09/2008 NL EF on myoview.   Hearing difficulty    R sided hole in eardrum, only wears L hearing aid   History of tobacco abuse    Hypertension    Lumbar stenosis with neurogenic claudication    a. 02/2011 s/p decompressive laminectomy.   Midsternal chest pain    a. 09/2008 Myoview: EF 56%, mild fixed thinning of inf wall particularly @ base -? attenuation.  No ischemia   PUD (peptic ulcer disease)    Remote, history of   Urinary hesitancy     Past  Surgical History:  Procedure Laterality Date   HERNIA REPAIR     Left   LUMBAR LAMINECTOMY/DECOMPRESSION MICRODISCECTOMY  03/04/2011   Procedure: LUMBAR LAMINECTOMY/DECOMPRESSION MICRODISCECTOMY;  Surgeon: Ralph Mendoza;  Location: [facility];  Service: Neurosurgery;  Laterality: N/A;  LUMBAR THREE-FOUR ,FOUR-FIVE LAMINECTOMIES   TYMPANOMASTOIDECTOMY     Left modified radical with type 3 tympanoplasty    Allergies: Patient has no known allergies.  Medications: Prior to Admission medications   Medication Sig Start Date End Date Taking? Authorizing Provider  acetaminophen (TYLENOL) 500 MG tablet Take 1,000 mg by mouth every 6 (six) hours as needed for mild pain or moderate pain.    [provider]  Dextromethorphan-Guaifenesin (DELSYM COUGH/CHEST CONGEST DM PO) Take 10 mLs by mouth daily as needed (for cough).     [provider]  diazepam (VALIUM) 5 MG tablet Take 0.5 tablets (2.5 mg total) by mouth every 8 (eight) hours as needed for anxiety. 05/02/17   Ralph Manifold, MD  ELIQUIS 2.5 MG TABS tablet TAKE 1 TABLET TWICE A DAY 12/27/18   Ralph Perla, MD  furosemide (LASIX) 20 MG tablet Take 40 mg by mouth daily.    [provider]  isosorbide dinitrate (ISORDIL) 30 MG tablet TAKE 1 TABLET DAILY 03/09/18   Ralph Perla, MD  losartan (COZAAR) 25 MG tablet TAKE 1 TABLET DAILY 09/08/18   Ralph Perla, MD  Multiple Vitamins-Minerals (PRESERVISION  AREDS 2+MULTI VIT) CAPS Take 1 capsule by mouth daily.    [provider]  potassium chloride SA (K-DUR) 20 MEQ tablet Take 1 tablet (20 mEq total) by mouth daily. 12/14/18   Ralph Perla, MD     Family History  Problem Relation Age of Onset   Lung cancer Mother    Appendicitis Father    Diabetes Sister        DM   Coronary artery disease Neg Hx        No premature CAD   Colon cancer Neg Hx     Social History   Socioeconomic History   Marital status: Widowed    Spouse name:  Not on file   Number of children: 2   Years of education: Not on file   Highest education level: Not on file  Occupational History   Occupation: Runner, broadcasting/film/video    Comment: Retired  Scientist, product/process development strain: Not on file   Food insecurity    Worry: Not on file    Inability: Not on Lexicographer needs    Medical: Not on file    Non-medical: Not on file  Tobacco Use   Smoking status: Former Smoker   Smokeless tobacco: Never Used   Tobacco comment: Remote history of smoking  Substance and Sexual Activity   Alcohol use: No   Drug use: No   Sexual activity: Not on file  Lifestyle   Physical activity    Days per week: Not on file    Minutes per session: Not on file   Stress: Not on file  Relationships   Social connections    Talks on phone: Not on file    Gets together: Not on file    Attends religious service: Not on file    Active member of club or organization: Not on file    Attends meetings of clubs or organizations: Not on file    Relationship status: Not on file  Other Topics Concern   Not on file  Social History Narrative   Lives with his wife in [place].   He swims daily.   He has 2 grown daughters.   1 Caffeine drink daily      Review of Systems: A 12 point ROS discussed and pertinent positives are indicated in the HPI above.  All other systems are negative.  Review of Systems  Constitutional: Negative for chills and fever.  HENT: Negative for nosebleeds.   Eyes: Negative for photophobia and pain.  Respiratory: Negative for cough and shortness of breath.   Cardiovascular: Negative for chest pain.  Gastrointestinal: Negative for abdominal pain, diarrhea, nausea and vomiting.  Musculoskeletal: Negative for back pain.  Skin: Negative for rash and wound.  Neurological: Negative for dizziness and headaches.    Vital Signs: BP (!) 158/65    Pulse 64    Temp (!) 97.5 F (36.4 C)    Ht 5' 10.5" (1.791 m)    Wt  175 lb (79.4 kg)    SpO2 98%    BMI 24.75 kg/m   Physical Exam Vitals signs reviewed.  Constitutional:      General: He is not in acute distress.    Comments: Very pleasant, talkative, good historian.  HENT:     Head: Normocephalic.  Cardiovascular:     Rate and Rhythm: Normal rate and regular rhythm.  Pulmonary:     Effort: Pulmonary effort is normal.     Breath sounds:  Normal breath sounds.  Abdominal:     General: There is no distension.     Palpations: Abdomen is soft.     Tenderness: There is no abdominal tenderness.  Skin:    General: Skin is warm and dry.  Neurological:     Mental Status: He is alert and oriented to person, place, and time.  Psychiatric:        Mood and Affect: Mood normal.        Behavior: Behavior normal.        Thought Content: Thought content normal.        Judgment: Judgment normal.      MD Evaluation Airway: WNL Heart: WNL Abdomen: WNL Chest/ Lungs: WNL ASA  Classification: 3 Mallampati/Airway Score: Two   Imaging: Ct Soft Tissue Neck Wo Contrast  Result Date: 01/22/2019 CLINICAL DATA:  Fusion protocol. Hard lump on right side of face at maxillary sinus 5-7 weeks EXAM: CT MAXILLOFACIAL WITHOUT CONTRAST; CT NECK WITHOUT CONTRAST TECHNIQUE: Multidetector CT imaging of the maxillofacial structures was performed. Multiplanar CT image reconstructions were also generated. Multi detector CT imaging of the neck soft tissues was also performed. Multiplanar CT image reconstructions were also generated COMPARISON:  Head CT 04/28/2015 FINDINGS: Pharynx and larynx: Streak artifact from dental restoration limits evaluation of the oral cavity. No appreciable mass or swelling within the nasopharynx, oral cavity/oropharynx, hypopharynx or larynx. Salivary glands: The parotid glands are unremarkable. Nonspecific asymmetric atrophy of the left submandibular gland. Thyroid: Unremarkable Lymph nodes: No pathologically enlarged cervical chain lymph nodes identified  Vascular: Poorly assessed on this noncontrast examination. Calcified atherosclerotic plaque within the visualized aortic arch, proximal major branch vessels of the neck and at the carotid bifurcations bilaterally. Limited intracranial: Generalized parenchymal atrophy. Visualized orbits: The right maxillary sinus mass encroaches upon in extraconal inferior right orbit, abutting the right inferior rectus muscle. Mastoids and visualized paranasal sinuses: There is a relatively homogeneous large soft tissue mass centered within the right maxillary sinus measuring 5.6 x 4.2 x 5.1 cm (AP x TV x CC). Resultant complete opacification of the right maxillary sinus. Associated osseous erosion of the anterior wall of the right maxillary sinus with the mass extending into the right premaxillary soft tissues by approximately 2.3 cm. This includes extension of the mass inferiorly along the alveolar right maxilla. Likely subtle erosion of the alveolar right maxilla superiorly. Erosion of the posterolateral wall of the right maxillary sinus with the mass extending into the right retromaxillary soft tissues and into the right pterygomaxillary fissure, and toward the right pterygopalatine fossa. The mass may involve the muscles of mastication on the right (series 8, image 35). Erosion through the right orbital floor with encroachment upon the extraconal inferior right orbit, with the mass abutting the inferior rectus muscle. Subtle erosion of the right lamina papyracea inferiorly also present. The mass also extends superomedially partially eroding the medial wall the left maxillary sinus and uncinate process, expanding the left ostiomeatal unit and extending into portions of the left nasal passage. Mild mucosal thickening within the inferior right frontal sinus and scattered within bilateral ethmoid air cells. Sequela of previous left mastoidectomy. No right mastoid effusion. Debris within the right external auditory canal. Skeleton:  Cervical spondylosis. Moderate/severe C3-C4 disc height loss. No acute bony abnormality. Upper chest: No consolidation within the imaged lung apices. These results will be called to the ordering clinician or representative by the Radiologist Assistant, and communication documented in the PACS or zVision Dashboard. IMPRESSION: 1. 5.6 cm mass completely  opacifying the right maxillary sinus with multifocal osseous erosion and extension beyond the right maxillary sinus as detailed. This includes, but is not limited to, extension into the right premaxillary soft tissues, right retromaxillary soft tissues/masticator space, pterygomaxillary fissure/pterygopalatine fossa, and extraconal inferior right orbit (abutting the right inferior rectus muscle). Favored differential considerations include squamous cell carcinoma, sinonasal undifferentiated carcinoma (SNUC), and chronic invasive fungal sinusitis. Direct tissue sampling recommended as clinically warranted. 2. No cervical lymphadenopathy. Electronically Signed   By: Kellie Simmering   On: 01/22/2019 00:12   Ct Maxillofacial Wo Contrast  Result Date: 01/22/2019 CLINICAL DATA:  Fusion protocol. Hard lump on right side of face at maxillary sinus 5-7 weeks EXAM: CT MAXILLOFACIAL WITHOUT CONTRAST; CT NECK WITHOUT CONTRAST TECHNIQUE: Multidetector CT imaging of the maxillofacial structures was performed. Multiplanar CT image reconstructions were also generated. Multi detector CT imaging of the neck soft tissues was also performed. Multiplanar CT image reconstructions were also generated COMPARISON:  Head CT 04/28/2015 FINDINGS: Pharynx and larynx: Streak artifact from dental restoration limits evaluation of the oral cavity. No appreciable mass or swelling within the nasopharynx, oral cavity/oropharynx, hypopharynx or larynx. Salivary glands: The parotid glands are unremarkable. Nonspecific asymmetric atrophy of the left submandibular gland. Thyroid: Unremarkable Lymph nodes:  No pathologically enlarged cervical chain lymph nodes identified Vascular: Poorly assessed on this noncontrast examination. Calcified atherosclerotic plaque within the visualized aortic arch, proximal major branch vessels of the neck and at the carotid bifurcations bilaterally. Limited intracranial: Generalized parenchymal atrophy. Visualized orbits: The right maxillary sinus mass encroaches upon in extraconal inferior right orbit, abutting the right inferior rectus muscle. Mastoids and visualized paranasal sinuses: There is a relatively homogeneous large soft tissue mass centered within the right maxillary sinus measuring 5.6 x 4.2 x 5.1 cm (AP x TV x CC). Resultant complete opacification of the right maxillary sinus. Associated osseous erosion of the anterior wall of the right maxillary sinus with the mass extending into the right premaxillary soft tissues by approximately 2.3 cm. This includes extension of the mass inferiorly along the alveolar right maxilla. Likely subtle erosion of the alveolar right maxilla superiorly. Erosion of the posterolateral wall of the right maxillary sinus with the mass extending into the right retromaxillary soft tissues and into the right pterygomaxillary fissure, and toward the right pterygopalatine fossa. The mass may involve the muscles of mastication on the right (series 8, image 35). Erosion through the right orbital floor with encroachment upon the extraconal inferior right orbit, with the mass abutting the inferior rectus muscle. Subtle erosion of the right lamina papyracea inferiorly also present. The mass also extends superomedially partially eroding the medial wall the left maxillary sinus and uncinate process, expanding the left ostiomeatal unit and extending into portions of the left nasal passage. Mild mucosal thickening within the inferior right frontal sinus and scattered within bilateral ethmoid air cells. Sequela of previous left mastoidectomy. No right mastoid  effusion. Debris within the right external auditory canal. Skeleton: Cervical spondylosis. Moderate/severe C3-C4 disc height loss. No acute bony abnormality. Upper chest: No consolidation within the imaged lung apices. These results will be called to the ordering clinician or representative by the Radiologist Assistant, and communication documented in the PACS or zVision Dashboard. IMPRESSION: 1. 5.6 cm mass completely opacifying the right maxillary sinus with multifocal osseous erosion and extension beyond the right maxillary sinus as detailed. This includes, but is not limited to, extension into the right premaxillary soft tissues, right retromaxillary soft tissues/masticator space, pterygomaxillary fissure/pterygopalatine fossa, and extraconal  inferior right orbit (abutting the right inferior rectus muscle). Favored differential considerations include squamous cell carcinoma, sinonasal undifferentiated carcinoma (SNUC), and chronic invasive fungal sinusitis. Direct tissue sampling recommended as clinically warranted. 2. No cervical lymphadenopathy. Electronically Signed   By: Kellie Simmering   On: 01/22/2019 00:12    Labs:  CBC: Recent Labs    04/04/18 1426 12/21/18 1512  WBC 4.5 6.3  HGB 11.8* 13.7  HCT 35.4* 42.2  PLT 154 176    COAGS: No results for input(s): INR, APTT in the last 8760 hours.  BMP: Recent Labs    04/04/18 1426 12/21/18 1512  NA 138 135  K 4.1 4.1  CL 99 92*  CO2 21 25  GLUCOSE 120* 86  BUN 21 20  CALCIUM 9.0 9.0  CREATININE 1.57* 1.55*  GFRNONAA 37* 37*  GFRAA 43* 43*    LIVER FUNCTION TESTS: No results for input(s): BILITOT, AST, ALT, ALKPHOS, PROT, ALBUMIN in the last 8760 hours.  TUMOR MARKERS: No results for input(s): AFPTM, CEA, CA199, CHROMGRNA in the last 8760 hours.  Assessment and Plan:  85 y/o M with enlarging right maxillary sinus mass as seen on CT dated 01/22/19, previous FNA in ENT office was suspicious for lymphoma but not diagnostic. A  request was made to IR for core biopsy of this area - patient history and imaging was reviewed by Dr. Kathlene Cote who approves procedure which is to be performed by Dr. Pascal Lux today.  Patient has been NPO since 7 pm last night, last dose of Eliquis 10/20. Afebrile, WBC 4.9, hgb 13.2, plt 178, INR 1.0.  Risks and benefits of right maxillary mass biopsy was discussed with the patient and/or patient's family including, but not limited to bleeding, infection, damage to adjacent structures or low yield requiring additional tests.  All of the questions were answered and there is agreement to proceed.  Consent signed and in chart.   Thank you for this interesting consult.  I greatly enjoyed meeting LAMARK MACHEN and look forward to participating in their care.  A copy of this report was sent to the requesting provider on this date.  Electronically Signed: Joaquim Nam, PA-C 02/10/2019, 12:31 PM   I spent a total of  15 Minutes  in face to face in clinical consultation, greater than 50% of which was counseling/coordinating care for right maxillary sinus biopsy.

## 2019-02-10 NOTE — Discharge Instructions (Signed)
Needle Biopsy, Care After °These instructions tell you how to care for yourself after your procedure. Your doctor may also give you more specific instructions. Call your doctor if you have any problems or questions. °What can I expect after the procedure? °After the procedure, it is common to have: °· Soreness. °· Bruising. °· Mild pain. °Follow these instructions at home: ° °· Return to your normal activities as told by your doctor. Ask your doctor what activities are safe for you. °· Take over-the-counter and prescription medicines only as told by your doctor. °· Wash your hands with soap and water before you change your bandage (dressing). If you cannot use soap and water, use hand sanitizer. °· Follow instructions from your doctor about: °? How to take care of your puncture site. °? When and how to change your bandage. °? When to remove your bandage. °· Check your puncture site every day for signs of infection. Watch for: °? Redness, swelling, or pain. °? Fluid or blood.  °? Pus or a bad smell. °? Warmth. °· Do not take baths, swim, or use a hot tub until your doctor approves. Ask your doctor if you may take showers. You may only be allowed to take sponge baths. °· Keep all follow-up visits as told by your doctor. This is important. °Contact a doctor if you have: °· A fever. °· Redness, swelling, or pain at the puncture site, and it lasts longer than a few days. °· Fluid, blood, or pus coming from the puncture site. °· Warmth coming from the puncture site. °Get help right away if: °· You have a lot of bleeding from the puncture site. °Summary °· After the procedure, it is common to have soreness, bruising, or mild pain at the puncture site. °· Check your puncture site every day for signs of infection, such as redness, swelling, or pain. °· Get help right away if you have severe bleeding from your puncture site. °This information is not intended to replace advice given to you by your health care provider. Make  sure you discuss any questions you have with your health care provider. °Document Released: 03/19/2008 Document Revised: 04/19/2017 Document Reviewed: 04/19/2017 °Elsevier Patient Education © 2020 [employer] ° °

## 2019-02-22 ENCOUNTER — Telehealth: Payer: Self-pay | Admitting: Hematology

## 2019-02-22 NOTE — Telephone Encounter (Signed)
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 I cld and spoke to Mr. Schuman's daughter, Juliann Pulse, and scheduled him to see Dr. Maylon Peppers on 11/11 at 9amw/labs at 830am. She's aware to arrive 15 minutes early.

## 2019-02-27 ENCOUNTER — Other Ambulatory Visit: Payer: Self-pay | Admitting: Hematology

## 2019-02-27 ENCOUNTER — Encounter (HOSPITAL_COMMUNITY): Payer: Self-pay | Admitting: Hematology

## 2019-02-27 ENCOUNTER — Telehealth: Payer: Self-pay | Admitting: *Deleted

## 2019-02-27 DIAGNOSIS — C8331 Diffuse large B-cell lymphoma, lymph nodes of head, face, and neck: Secondary | ICD-10-CM

## 2019-02-27 LAB — SURGICAL PATHOLOGY

## 2019-02-27 NOTE — Progress Notes (Signed)
[place] CONSULT NOTE  Patient Care Team: [name], MD as PCP - General (Family Medicine) Stanford Breed Denice Bors, MD as PCP - Cardiology (Cardiology) [name], MD as Consulting Physician (Hematology) Leota Sauers, RN as Oncology Nurse Navigator  HEME/ONC OVERVIEW: 1. 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 Chronic, severe HFrEF (LVEF 25-30%) with periodic HF exacerbation  2. Permanent A-fib on Eliquis 3. 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 Lucia Gaskins of ENT for evaluation of right-sided sinus pain, and CT neck/maxillofacial in early 01/2019 showed of large mass occupying the right maxillary sinus with multifocal bony erosion and extension beyond the left maxillary sinus, including the right masticator space, pterygopalatine fossa, and inferior orbit.  FNA of the mass showed atypical cells suspicious for lymphoma, but was nondiagnostic.  He underwent ultrasound-guided core needle biopsy in late 01/2019, which showed an aggressive B-cell lymphoma with Ki-67 ~90%, suggesting diffuse large B-cell lymphoma vs double-hit lymphoma.  FISH panel is currently pending. -I reviewed the labs, imaging  and pathology results in detail with the patient and his family -I also reviewed NCCN guidelines in detail with the patient -In the setting of aggressive B-cell lymphoma, the standard-of-care treatment includes R-CHOP, R-EPOCH, and other combination chemotherapy regimen, with or without consolidative radiation -However, the patient is very advanced in age and has extensive medical comorbidities, including chronic severe heart failure with reduced EF (LVEF 26-37%), complicated by episodic acute heart failure exacerbation, permanent A-fib on Eliquis, and Stage III CKD, making him a very poor candidate for any systemic therapy due to the very high risk of side effects  -Due to the cytopenias and hyperbilirubinemia, I have ordered PET to assess for any evidence of systemic disease.  While this does not necessarily change the management, it will have some prognostic value, depending on the extent of the disease.   -As his primary symptoms are localized to the right maxillary sinus, I recommend palliative RT to the soft tissue mass.  I emphasized with the patient and his family that the goal of treatment would be for palliative intent only, NOT curative.  -Pending the PET scan results and the patient's symptom burden, we can determine if he will require referral to palliative care/hospice right away or in the future   Normocytic anemia -Hgb 12.1 today, new -Possibly due to anemia of chronic disease vs bone marrow involvement by lymphoma -Clinically, patient denies any symptoms of bleeding -See work-up for diffuse large B-cell lymphoma as above -We will monitor for now  Thrombocytopenia -Plts 146k today, new -Possibly due to bone marrow involvement by lymphoma -Patient denies any symptoms of bleeding -We will monitor for now  Hyperbilirubinemia -Tbili 2.3 today, new since 2019 -I have ordered fractionated bilirubin at the next visit -PET scan to assess for any liver  involvement by lymphoma -We will  monitor closely  Goals of care discussion -As discussed above, due to the patient's poor candidacy for any systemic therapy, I recommended proceeding with palliative RT to the right maxillary sinus due to the extensive local bony invasion -Due to the aggressive nature of the B-cell lymphoma in the maxillary sinus, it will likely progress in the next a few weeks to months, with symptoms varying based on the sites of disease involvement -I emphasized with the patient and his family that aggressive B-cell lymphoma (Ki-67 ~90%) is generally associated with poor prognosis, even in younger patients who are able to tolerate aggressive chemotherapy.  Due to his advanced age and multiple other comorbidities, the prognosis is poor.  Patient and his family expressed understanding.  -Pending his symptom burden after completing palliative RT, we can consider referral to palliative care/hospice for further symptom management as needed   Orders Placed This Encounter  Procedures  . PET, initial (skull base to thigh)    Standing Status:   Future    Standing Expiration Date:   02/29/2020    Order Specific Question:   If indicated for the ordered procedure, I authorize the administration of a radiopharmaceutical per Radiology protocol    Answer:   Yes    Order Specific Question:   Preferred imaging location?    Answer:   [facility]    Order Specific Question:   Radiology Contrast Protocol - do NOT remove file path    Answer:   [email or web address]  . Ambulatory referral to Radiation Oncology    Referral Priority:   Urgent    Referral Type:   Consultation    Referral Reason:   Specialty Services Required    Requested Specialty:   Radiation Oncology    Number of Visits Requested:   1   A total of more than 60 minutes were spent face-to-face with the patient during this encounter and over half of that time was spent on counseling and coordination of care as outlined above.     All questions were answered. The patient knows to call the clinic with any problems, questions or concerns.  Return in 2 weeks for labs, imaging results, and clinic appointment.  [name], MD 03/01/2019 10:14 AM   CHIEF COMPLAINTS/PURPOSE OF CONSULTATION:  "I have a hard time hearing"  HISTORY OF PRESENTING ILLNESS:  Ralph Mendoza 85 y.o. male is here because of newly diagnosed diffuse large B-cell lymphoma of the right maxillary sinus.  Patient has severe baseline hearing difficulty, and therefore the history was somewhat limited.  He was accompanied by his son-in-law.  According to the patient and his son-in-law, he first noticed right-sided facial swelling approximately 2 months ago after waking up from sleep.  He was referred to Dr. Lucia Gaskins of ENT for further evaluation, who ordered CT neck/maxillofacial in 01/2019 that showed a large soft tissue mass in the right maxillary sinus.  He underwent FNA of the right maxillary mass, which was nondiagnostic, followed by core biopsy, which showed diffuse large B-cell lymphoma.  He reports that since the recent biopsy, the right-sided facial swelling has improved significantly.  He has chronic visual impairment due to macular degeneration and cataract, but denies any recent acute vision change.  His appetite is fair, but he has lost about 10 pounds over the past 6 months.  He lives in the independent living facility.  He denies any other constitutional symptoms.  REVIEW OF SYSTEMS:   Constitutional: ( - )  fevers, ( - )  chills , ( - ) night sweats Eyes: ( + ) blurriness of vision, ( - ) double vision, ( - ) watery eyes Ears, nose, mouth, throat, and face: ( - ) mucositis, ( - ) sore throat Respiratory: ( - ) cough, ( - ) dyspnea, ( - ) wheezes Cardiovascular: ( - ) palpitation, ( - ) chest discomfort, ( - ) lower extremity swelling Gastrointestinal:  ( - ) nausea, ( - ) heartburn, ( - ) change in bowel habits Skin: ( - ) abnormal skin  rashes Lymphatics: ( - ) new lymphadenopathy, ( - ) easy bruising Neurological: ( - ) numbness, ( - ) tingling, ( - ) new weaknesses Behavioral/Psych: ( - ) mood change, ( - ) new changes  All other systems were reviewed with the patient and are negative.  I have reviewed his chart and materials related to his cancer extensively and collaborated history with the patient. Summary of oncologic history is as follows: Oncology History  B-cell lymphoma ([place])  01/20/2019 Imaging   CT neck: IMPRESSION: 1. 5.6 cm mass completely opacifying the right maxillary sinus with multifocal osseous erosion and extension beyond the right maxillary sinus as detailed. This includes, but is not limited to, extension into the right premaxillary soft tissues, right retromaxillary soft tissues/masticator space, pterygomaxillary fissure/pterygopalatine fossa, and extraconal inferior right orbit (abutting the right inferior rectus muscle). Favored differential considerations include squamous cell carcinoma, sinonasal undifferentiated carcinoma (SNUC), and chronic invasive fungal sinusitis. Direct tissue sampling recommended as clinically warranted. 2. No cervical lymphadenopathy.   01/26/2019 Pathology Results   DIAGNOSIS:  - Atypical lymphocytes suspicious for a lymphoproliferative process   02/10/2019 Pathology Results   FINAL MICROSCOPIC DIAGNOSIS:   A. SOFT TISSUE MASS, RIGHT MAXILLARY SINUS, NEEDLE CORE BIOPSY:  - Aggressive large B-cell lymphoma, see comment.   COMMENT:   Core biopsies reveal sheets of large abnormal lymphocytes. The  lymphocytes have irregular nuclear contours, occasional prominent  nuclei, and frequent apoptotic figures and mitotic figures.  Immunohistochemistry is positive for CD20, bcl-6, and bcl-2. There is  very faint possible staining with CD10. The cells are negative for CD30,  CD56, and EBV in situ hybridization. Ki-67 is elevated at 90%. CD3, CD4,  CD8 and CD5 highlight  T-cells. The findings are consistent with an  aggressive large B-cell lymphoma with differential including a diffuse  large B-cell lymphoma or high grade B-cell lymphoma (double hit  lymphoma). FISH for MYC, BCL2, and BCL6 will be ordered. Dr. Lucia Gaskins was  paged on 02/14/2019.    02/27/2019 Initial Diagnosis   B-cell lymphoma [facility])     MEDICAL HISTORY:  Past Medical History:  Diagnosis Date  . Atrial fibrillation ([place])   . BPH (benign prostatic hyperplasia)   . Dyspnea on exertion    a. 09/2008 NL EF on myoview.  [name] Hearing difficulty    R sided hole in eardrum, only wears L hearing aid  . History of tobacco abuse   . Hypertension   . Lumbar stenosis with neurogenic claudication    a. 02/2011 s/p decompressive laminectomy.  . Midsternal chest pain    a. 09/2008 Myoview: EF 56%, mild fixed thinning of inf wall particularly @ base -? attenuation.  No ischemia  . PUD (peptic ulcer disease)    Remote, history of  . Urinary hesitancy     SURGICAL HISTORY: Past Surgical History:  Procedure Laterality Date  . HERNIA REPAIR     Left  . LUMBAR LAMINECTOMY/DECOMPRESSION  MICRODISCECTOMY  03/04/2011   Procedure: LUMBAR LAMINECTOMY/DECOMPRESSION MICRODISCECTOMY;  Surgeon: Ophelia Charter;  Location: [facility];  Service: Neurosurgery;  Laterality: N/A;  LUMBAR THREE-FOUR ,FOUR-FIVE LAMINECTOMIES  . TYMPANOMASTOIDECTOMY     Left modified radical with type 3 tympanoplasty    SOCIAL HISTORY: Social History   Socioeconomic History  . Marital status: Widowed    Spouse name: Not on file  . Number of children: 2  . Years of education: Not on file  . Highest education level: Not on file  Occupational History  . Occupation: [employer]    Comment: Retired  Scientific laboratory technician  . Financial resource strain: Not on file  . Food insecurity    Worry: Not on file    Inability: Not on file  . Transportation needs    Medical: Not on file    Non-medical: Not on file  Tobacco Use  .  Smoking status: Former Research scientist (life sciences)  . Smokeless tobacco: Never Used  . Tobacco comment: Remote history of smoking  Substance and Sexual Activity  . Alcohol use: No  . Drug use: No  . Sexual activity: Not on file  Lifestyle  . Physical activity    Days per week: Not on file    Minutes per session: Not on file  . Stress: Not on file  Relationships  . Social Herbalist on phone: Not on file    Gets together: Not on file    Attends religious service: Not on file    Active member of club or organization: Not on file    Attends meetings of clubs or organizations: Not on file    Relationship status: Not on file  . Intimate partner violence    Fear of current or ex partner: Not on file    Emotionally abused: Not on file    Physically abused: Not on file    Forced sexual activity: Not on file  Other Topics Concern  . Not on file  Social History Narrative   Lives with his wife in [place].   He swims daily.   He has 2 grown daughters.   1 Caffeine drink daily     FAMILY HISTORY: Family History  Problem Relation Age of Onset  . Lung cancer Mother   . Appendicitis Father   . Diabetes Sister        DM  . Coronary artery disease Neg Hx        No premature CAD  . Colon cancer Neg Hx     ALLERGIES:  has No Known Allergies.  MEDICATIONS:  Current Outpatient Medications  Medication Sig Dispense Refill  . acetaminophen (TYLENOL) 500 MG tablet Take 1,000 mg by mouth every 6 (six) hours as needed for mild pain or moderate pain.    [name] Dextromethorphan-Guaifenesin (DELSYM COUGH/CHEST CONGEST DM PO) Take 10 mLs by mouth daily as needed (for cough).     . diazepam (VALIUM) 5 MG tablet Take 0.5 tablets (2.5 mg total) by mouth every 8 (eight) hours as needed for anxiety. 10 tablet 0  . ELIQUIS 2.5 MG TABS tablet TAKE 1 TABLET TWICE A DAY 180 tablet 1  . furosemide (LASIX) 20 MG tablet Take 40 mg by mouth daily.    . isosorbide dinitrate (ISORDIL) 30 MG tablet TAKE 1 TABLET DAILY 90  tablet 3  . losartan (COZAAR) 25 MG tablet TAKE 1 TABLET DAILY 90 tablet 2  . Multiple Vitamins-Minerals (PRESERVISION AREDS 2+MULTI VIT) CAPS Take 1 capsule by  mouth daily.    . potassium chloride SA (K-DUR) 20 MEQ tablet Take 1 tablet (20 mEq total) by mouth daily. 90 tablet 3   No current facility-administered medications for this visit.     PHYSICAL EXAMINATION: ECOG PERFORMANCE STATUS: 3 - Symptomatic, >50% confined to bed  Vitals:   03/01/19 0921  BP: (!) 146/75  Pulse: 62  Resp: 17  Temp: 98 F (36.7 C)  SpO2: 99%   Filed Weights   03/01/19 0921  Weight: 166 lb 4.8 oz (75.4 kg)    GENERAL: alert, no distress, elderly and frail appearing  SKIN: skin color, texture, turgor are normal, no rashes or significant lesions EYES: some dried eye drainage bilaterally OROPHARYNX: no exudate, no erythema; lips, buccal mucosa, and tongue normal  NECK: supple, non-tender LYMPH:  no palpable lymphadenopathy in the cervical LUNGS: clear to auscultation with normal breathing effort HEART: regular rate & rhythm, no murmurs, no lower extremity edema ABDOMEN: soft, non-tender, non-distended, normal bowel sounds Musculoskeletal: no cyanosis of digits and no clubbing  PSYCH: alert, slowed speech   LABORATORY DATA:  I have reviewed the data as listed Lab Results  Component Value Date   WBC 4.2 03/01/2019   HGB 12.1 (L) 03/01/2019   HCT 35.9 (L) 03/01/2019   MCV 91.3 03/01/2019   PLT 146 (L) 03/01/2019   Lab Results  Component Value Date   NA 138 03/01/2019   K 3.7 03/01/2019   CL 100 03/01/2019   CO2 28 03/01/2019    RADIOGRAPHIC STUDIES: I have personally reviewed the radiological images as listed and agreed with the findings in the report. [place] Core Biopsy (lymph Nodes)  Result Date: 02/10/2019 INDICATION: Infiltrative mass affecting the right maxillary sinus. Patient is undergone fine-needle aspiration of the biopsy in referring 54 office with incomplete diagnosis  though findings worrisome for lymphoma. As such, request made for ultrasound-guided biopsy for tissue diagnostic purposes/confirmation. EXAM: ULTRASOUND-GUIDED RIGHT FACIAL/MAXILLA BIOPSY COMPARISON:  Maxillofacial CT-01/20/2019 MEDICATIONS: None ANESTHESIA/SEDATION: Moderate (conscious) sedation was employed during this procedure. A total of Versed 25 mg and Fentanyl 0.5 mcg was administered intravenously. Moderate Sedation Time: 14 minutes. The patient's level of consciousness and vital signs were monitored continuously by radiology nursing throughout the procedure under my direct supervision. COMPLICATIONS: None immediate. TECHNIQUE: Informed written consent was obtained from the patient after a discussion of the risks, benefits and alternatives to treatment. Questions regarding the procedure were encouraged and answered. Initial ultrasound scanning demonstrated a large infiltrative hypoechoic mass affecting the right maxilla/maxillary sinus measuring at least 5.7 x 3.7 cm (image 4). An ultrasound image was saved for documentation purposes. The procedure was planned. A timeout was performed prior to the initiation of the procedure. The operative was prepped and draped in the usual sterile fashion, and a sterile drape was applied covering the operative field. A timeout was performed prior to the initiation of the procedure. Local anesthesia was provided with 1% lidocaine with epinephrine. Under direct ultrasound guidance, an 18 gauge core needle device was utilized to obtain to obtain 4 core needle biopsies of the large hypoechoic mass affecting the right maxilla/maxillary sinus. Note, the biopsy was somewhat challenging given overlying osseous structures. The samples were placed in saline and submitted to pathology. The needle was removed and hemostasis was achieved with manual compression. Post procedure scan was negative for significant hematoma. A dressing was placed. The patient tolerated the procedure well  without immediate postprocedural complication. IMPRESSION: Technically successful ultrasound guided biopsy of infiltrative mass affecting the  right maxilla/maxillary sinus. Electronically Signed   By: Sandi Mariscal M.D.   On: 02/10/2019 16:38    PATHOLOGY: I have reviewed the pathology reports as documented in the oncologist history.

## 2019-02-27 NOTE — Telephone Encounter (Addendum)
Oncology Nurse Navigator Documentation  Placed introductory call to new referral patient [name], spoke with his s-in-law Ralph Mendoza.  Introduced myself as the H&N oncology nurse navigator that works with Dr. Maylon Peppers who received referral from Dr. Lucia Gaskins.  He confirmed understanding of referral.  Briefly explained my role as navigator, provided my contact information.   Confirmed understanding of [place] location, Wed's appts, encouraged 8:00 arrival for registration, explained registration process.  He explained Ralph Mendoza is HoH and eyesight is failing.  I assured him he would be able to accompany Ralph Mendoza during appt.  He verbalized understanding of information provided, expressed appreciation for my call.  Navigator Initial Assessment . Employment Status:  Retired/not working . Currently on FMLA / STD:  N/A . Living Situation: lives alone in apt at [place] . Support System:  Family in [place] . PCP: yes . PCD: Dr. Arita Miss . Financial Concerns: no . Transportation Needs: no . Sensory Deficits:  HoH, failing eyesight . Language Barriers/Interpreter Needed:  no . Ambulation Needs: yes.  Uses motorized WC when out . DME Used in Home: walker . Psychosocial Needs:  no . Concerns/Needs Understanding Cancer:  addressed/answered by navigator to best of ability  Self-Expressed Needs: no  [name], RN, BSN Head & Neck Oncology Nurse [place] [facility] [place] [phone]

## 2019-03-01 ENCOUNTER — Inpatient Hospital Stay: Payer: Medicare Other

## 2019-03-01 ENCOUNTER — Other Ambulatory Visit: Payer: Self-pay

## 2019-03-01 ENCOUNTER — Encounter: Payer: Self-pay | Admitting: Hematology

## 2019-03-01 ENCOUNTER — Inpatient Hospital Stay: Payer: Medicare Other | Attending: Hematology | Admitting: Hematology

## 2019-03-01 VITALS — BP 146/75 | HR 62 | Temp 98.0°F | Resp 17 | Ht 70.5 in | Wt 166.3 lb

## 2019-03-01 DIAGNOSIS — Z7901 Long term (current) use of anticoagulants: Secondary | ICD-10-CM | POA: Insufficient documentation

## 2019-03-01 DIAGNOSIS — Z7189 Other specified counseling: Secondary | ICD-10-CM | POA: Diagnosis not present

## 2019-03-01 DIAGNOSIS — I4821 Permanent atrial fibrillation: Secondary | ICD-10-CM | POA: Insufficient documentation

## 2019-03-01 DIAGNOSIS — D696 Thrombocytopenia, unspecified: Secondary | ICD-10-CM | POA: Diagnosis not present

## 2019-03-01 DIAGNOSIS — N183 Chronic kidney disease, stage 3 unspecified: Secondary | ICD-10-CM | POA: Insufficient documentation

## 2019-03-01 DIAGNOSIS — I130 Hypertensive heart and chronic kidney disease with heart failure and stage 1 through stage 4 chronic kidney disease, or unspecified chronic kidney disease: Secondary | ICD-10-CM | POA: Diagnosis not present

## 2019-03-01 DIAGNOSIS — D649 Anemia, unspecified: Secondary | ICD-10-CM

## 2019-03-01 DIAGNOSIS — C8331 Diffuse large B-cell lymphoma, lymph nodes of head, face, and neck: Secondary | ICD-10-CM

## 2019-03-01 DIAGNOSIS — Z515 Encounter for palliative care: Secondary | ICD-10-CM | POA: Insufficient documentation

## 2019-03-01 DIAGNOSIS — Z87891 Personal history of nicotine dependence: Secondary | ICD-10-CM | POA: Diagnosis not present

## 2019-03-01 DIAGNOSIS — H3530 Unspecified macular degeneration: Secondary | ICD-10-CM | POA: Insufficient documentation

## 2019-03-01 DIAGNOSIS — Z79899 Other long term (current) drug therapy: Secondary | ICD-10-CM | POA: Insufficient documentation

## 2019-03-01 DIAGNOSIS — N400 Enlarged prostate without lower urinary tract symptoms: Secondary | ICD-10-CM | POA: Diagnosis not present

## 2019-03-01 DIAGNOSIS — Z8711 Personal history of peptic ulcer disease: Secondary | ICD-10-CM | POA: Insufficient documentation

## 2019-03-01 DIAGNOSIS — H538 Other visual disturbances: Secondary | ICD-10-CM | POA: Insufficient documentation

## 2019-03-01 LAB — CBC WITH DIFFERENTIAL (CANCER CENTER ONLY)
Abs Immature Granulocytes: 0.01 10*3/uL (ref 0.00–0.07)
Basophils Absolute: 0.1 10*3/uL (ref 0.0–0.1)
Basophils Relative: 1 %
Eosinophils Absolute: 0.1 10*3/uL (ref 0.0–0.5)
Eosinophils Relative: 3 %
HCT: 35.9 % — ABNORMAL LOW (ref 39.0–52.0)
Hemoglobin: 12.1 g/dL — ABNORMAL LOW (ref 13.0–17.0)
Immature Granulocytes: 0 %
Lymphocytes Relative: 31 %
Lymphs Abs: 1.3 10*3/uL (ref 0.7–4.0)
MCH: 30.8 pg (ref 26.0–34.0)
MCHC: 33.7 g/dL (ref 30.0–36.0)
MCV: 91.3 fL (ref 80.0–100.0)
Monocytes Absolute: 0.6 10*3/uL (ref 0.1–1.0)
Monocytes Relative: 14 %
Neutro Abs: 2.1 10*3/uL (ref 1.7–7.7)
Neutrophils Relative %: 51 %
Platelet Count: 146 10*3/uL — ABNORMAL LOW (ref 150–400)
RBC: 3.93 MIL/uL — ABNORMAL LOW (ref 4.22–5.81)
RDW: 16.6 % — ABNORMAL HIGH (ref 11.5–15.5)
WBC Count: 4.2 10*3/uL (ref 4.0–10.5)
nRBC: 0 % (ref 0.0–0.2)

## 2019-03-01 LAB — CMP (CANCER CENTER ONLY)
ALT: 13 U/L (ref 0–44)
AST: 21 U/L (ref 15–41)
Albumin: 3.8 g/dL (ref 3.5–5.0)
Alkaline Phosphatase: 104 U/L (ref 38–126)
Anion gap: 10 (ref 5–15)
BUN: 24 mg/dL — ABNORMAL HIGH (ref 8–23)
CO2: 28 mmol/L (ref 22–32)
Calcium: 9 mg/dL (ref 8.9–10.3)
Chloride: 100 mmol/L (ref 98–111)
Creatinine: 1.49 mg/dL — ABNORMAL HIGH (ref 0.61–1.24)
GFR, Est AFR Am: 45 mL/min — ABNORMAL LOW (ref >60)
GFR, Estimated: 39 mL/min — ABNORMAL LOW (ref >60)
Glucose, Bld: 101 mg/dL — ABNORMAL HIGH (ref 70–99)
Potassium: 3.7 mmol/L (ref 3.5–5.1)
Sodium: 138 mmol/L (ref 135–145)
Total Bilirubin: 2.3 mg/dL — ABNORMAL HIGH (ref 0.3–1.2)
Total Protein: 7.7 g/dL (ref 6.5–8.1)

## 2019-03-01 LAB — LACTATE DEHYDROGENASE: LDH: 162 U/L (ref 98–192)

## 2019-03-02 ENCOUNTER — Telehealth: Payer: Self-pay | Admitting: Hematology

## 2019-03-02 NOTE — Telephone Encounter (Signed)
I talk with patient regarding schedule  

## 2019-03-06 ENCOUNTER — Encounter: Payer: Self-pay | Admitting: Hematology

## 2019-03-06 NOTE — Progress Notes (Signed)
Lymphoma Location(s) / Histology:  02/10/19 FINAL MICROSCOPIC DIAGNOSIS: A. SOFT TISSUE MASS, RIGHT MAXILLARY SINUS, NEEDLE CORE BIOPSY: - Aggressive large B-cell lymphoma,  Ralph Mendoza presented with symptoms of: He presented to Dr. Lucia Gaskins (ENT) for evaluation of sinus pain and had a CT scan early 01/2019 that demonstrated a large mass to his maxillary sinus.   Biopsies of a soft tissue mass to his right maxillary sinus revealed: Aggressive large B-cell lymphoma.  Past/Anticipated interventions by medical oncology, if any:  Dr. Maylon Peppers 03/01/19 Goals of care discussion -As discussed above, due to the patient's poor candidacy for any systemic therapy, I recommended proceeding with palliative RT to the right maxillary sinus due to the extensive local bony invasion -Due to the aggressive nature of the B-cell lymphoma in the maxillary sinus, it will likely progress in the next a few weeks to months, with symptoms varying based on the sites of disease involvement -I emphasized with the patient and his family that aggressive B-cell lymphoma (Ki-67 ~90%) is generally associated with poor prognosis, even in younger patients who are able to tolerate aggressive chemotherapy.  Due to his advanced age and multiple other comorbidities, the prognosis is poor.  Patient and his family expressed understanding.  -Pending his symptom burden after completing palliative RT, we can consider referral to palliative care/hospice for further symptom management as needed   Weight changes, if any, over the past 6 months: He has weight changes with fluid losses and gains.   Recurrent fevers, or drenching night sweats, if any: His daughter is not aware.   SAFETY ISSUES:  Prior radiation? No  Pacemaker/ICD? No  Possible current pregnancy? No  Is the patient on methotrexate? No  Current Complaints / other details:   His daughter reports that he is eating and drinking.

## 2019-03-07 NOTE — Telephone Encounter (Signed)
If I understand the message correctly, the patient is requesting telemedicine visit? If so, okay to change it to telemedicine visit.   Thanks.   Dr. Maylon Peppers

## 2019-03-08 ENCOUNTER — Other Ambulatory Visit: Payer: Self-pay

## 2019-03-08 ENCOUNTER — Ambulatory Visit (HOSPITAL_COMMUNITY)
Admission: RE | Admit: 2019-03-08 | Discharge: 2019-03-08 | Disposition: A | Discharge status: Home / Self Care | Payer: Medicare Other | Source: Ambulatory Visit | Attending: Hematology | Admitting: Hematology

## 2019-03-08 DIAGNOSIS — K5730 Diverticulosis of large intestine without perforation or abscess without bleeding: Secondary | ICD-10-CM | POA: Diagnosis not present

## 2019-03-08 DIAGNOSIS — C8331 Diffuse large B-cell lymphoma, lymph nodes of head, face, and neck: Secondary | ICD-10-CM

## 2019-03-08 DIAGNOSIS — N400 Enlarged prostate without lower urinary tract symptoms: Secondary | ICD-10-CM | POA: Insufficient documentation

## 2019-03-08 DIAGNOSIS — I700 Atherosclerosis of aorta: Secondary | ICD-10-CM | POA: Insufficient documentation

## 2019-03-08 DIAGNOSIS — J90 Pleural effusion, not elsewhere classified: Secondary | ICD-10-CM | POA: Insufficient documentation

## 2019-03-08 DIAGNOSIS — I2510 Atherosclerotic heart disease of native coronary artery without angina pectoris: Secondary | ICD-10-CM | POA: Diagnosis not present

## 2019-03-08 LAB — GLUCOSE, CAPILLARY: Glucose-Capillary: 88 mg/dL (ref 70–99)

## 2019-03-08 MED ORDER — SODIUM FLUORIDE F-18: Freq: Once | INTRAVENOUS | Status: DC

## 2019-03-08 MED ORDER — FLUDEOXYGLUCOSE F - 18 (FDG) INJECTION
8.3200 | Freq: Once | INTRAVENOUS | Status: AC | PRN
  Administered 2019-03-08: 8.32 via INTRAVENOUS

## 2019-03-09 NOTE — Progress Notes (Signed)
Radiation Oncology         [phone] ________________________________  Initial outpatient MyChart Consultation   Name: Ralph Mendoza MRN: [id number]  Date: 03/10/2019  DOB: July 01, 1920  [id number], Nathen May, MD  [name], MD   REFERRING PHYSICIAN: [name], MD  DIAGNOSIS:    ICD-10-CM   1. 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 Lymphoma of paranasal sinus (HCC)  C85.99     Cancer Staging Diffuse large b-cell lymphoma, lymph nodes of head, face, and neck (HCC) Staging form: Hodgkin and Non-Hodgkin Lymphoma, AJCC 6th Edition - Clinical stage from 03/10/2019: Stage IE - Signed by Eppie Gibson, MD on 03/13/2019   CHIEF COMPLAINT: Here to discuss management of lymphoma  HISTORY OF PRESENT ILLNESS::Ralph Mendoza is a 85 y.o. male who presented with right-sided facial swelling that came on quickly.  Subsequently, the patient saw Dr. Lucia Gaskins who ordered CT scan and FNA of the mass. CT neck/maxillofacial performed on 01/20/2019 showed: 6.5 cm mass completely opacifying the right maxillary sinus with multifocal osseous erosion and extension beyond the right maxillary sinus, including but not limited to right premaxillary soft tissues, right retromaxillar soft tissues/masticator space, pterygomaxillary fissure/pterygopalatine fossa, and extraconal inferior right orbit (abutting the right inferior rectus muscle); no cervical lymphadenopathy.  FNA of right maxillary sinus on 01/26/2019 revealed: atypical lymphocytes. This was followed by biopsy on 02/10/2019, which showed: aggressive large B-cell lymphoma   Pertinent imaging thus far includes PET scan performed on 03/08/2019 revealing low-level FDG uptake associated with the right maxillary sinus mass, which has substantially decreased in size since recent maxillofacial CT; no hypermetabolic lymphadenopathy or other findings of metabolically active lymphoma.  I have discussed the imaging with Dr Maylon Peppers and  reviewed it myself.  The patient has not undergone any chemotherapy or steroids.  Interestingly the PET scan shows significant improvement in the right maxillary sinus.  It is possible that a lot of the opacification from his CT scan was not actually lymphoma but was congestion that since drained after his biopsy.   The patient has been discussed at tumor board and due to his medical issues and age she is not a candidate for chemotherapy.  Weight Changes: he has lost about 5-10 lb  Wt Readings from Last 3 Encounters:  03/01/19 166 lb 4.8 oz (75.4 kg)  02/10/19 175 lb (79.4 kg)  12/21/18 173 lb (78.5 kg)   Tobacco history, if any: former smoker  ETOH abuse, if any: none  Prior cancers, if any: skin cancer, type unknown  No known fevers or night sweats  The patient is eating and drinking.  The entire telemedicine consultation is with the patient's daughter.  The patient himself is in a senior community and was not present for the discussion.  PREVIOUS RADIATION THERAPY: No  PAST MEDICAL HISTORY:  has a past medical history of Atrial fibrillation ([place]), BPH (benign prostatic hyperplasia), Dyspnea on exertion, Hearing difficulty, History of tobacco abuse, Hypertension, Lumbar stenosis with neurogenic claudication, Midsternal chest pain, PUD (peptic ulcer disease), and Urinary hesitancy.    PAST SURGICAL HISTORY: Past Surgical History:  Procedure Laterality Date   HERNIA REPAIR     Left   LUMBAR LAMINECTOMY/DECOMPRESSION MICRODISCECTOMY  03/04/2011   Procedure: LUMBAR LAMINECTOMY/DECOMPRESSION MICRODISCECTOMY;  Surgeon: Ophelia Charter;  Location: [place] [facility];  Service: Neurosurgery;  Laterality: N/A;  LUMBAR THREE-FOUR ,FOUR-FIVE LAMINECTOMIES   TYMPANOMASTOIDECTOMY     Left modified radical with type 3 tympanoplasty  FAMILY HISTORY: family history includes Appendicitis in his father; Diabetes in his sister; Lung cancer in his mother.  SOCIAL HISTORY:  reports that he has  quit smoking. He has never used smokeless tobacco. He reports that he does not drink alcohol or use drugs.  ALLERGIES: Patient has no known allergies.  MEDICATIONS:  Current Outpatient Medications  Medication Sig Dispense Refill   acetaminophen (TYLENOL) 500 MG tablet Take 1,000 mg by mouth every 6 (six) hours as needed for mild pain or moderate pain.     Dextromethorphan-Guaifenesin (DELSYM COUGH/CHEST CONGEST DM PO) Take 10 mLs by mouth daily as needed (for cough).      ELIQUIS 2.5 MG TABS tablet TAKE 1 TABLET TWICE A DAY 180 tablet 1   furosemide (LASIX) 20 MG tablet Take 40 mg by mouth daily.     isosorbide dinitrate (ISORDIL) 30 MG tablet TAKE 1 TABLET DAILY 90 tablet 3   losartan (COZAAR) 25 MG tablet TAKE 1 TABLET DAILY 90 tablet 2   Multiple Vitamins-Minerals (PRESERVISION AREDS 2+MULTI VIT) CAPS Take 1 capsule by mouth daily.     potassium chloride SA (K-DUR) 20 MEQ tablet Take 1 tablet (20 mEq total) by mouth daily. 90 tablet 3   diazepam (VALIUM) 5 MG tablet Take 0.5 tablets (2.5 mg total) by mouth every 8 (eight) hours as needed for anxiety. (Patient not taking: Reported on 03/10/2019) 10 tablet 0   No current facility-administered medications for this encounter.     REVIEW OF SYSTEMS:  Notable for that above.    Physical exam: N/A  LABORATORY DATA:  Lab Results  Component Value Date   WBC 4.2 03/01/2019   HGB 12.1 (L) 03/01/2019   HCT 35.9 (L) 03/01/2019   MCV 91.3 03/01/2019   PLT 146 (L) 03/01/2019   CMP     Component Value Date/Time   NA 138 03/01/2019 0905   NA 135 12/21/2018 1512   K 3.7 03/01/2019 0905   CL 100 03/01/2019 0905   CO2 28 03/01/2019 0905   GLUCOSE 101 (H) 03/01/2019 0905   BUN 24 (H) 03/01/2019 0905   BUN 20 12/21/2018 1512   CREATININE 1.49 (H) 03/01/2019 0905   CREATININE 1.25 (H) 01/31/2016 1341   CALCIUM 9.0 03/01/2019 0905   PROT 7.7 03/01/2019 0905   ALBUMIN 3.8 03/01/2019 0905   AST 21 03/01/2019 0905   ALT 13  03/01/2019 0905   ALKPHOS 104 03/01/2019 0905   BILITOT 2.3 (H) 03/01/2019 0905   GFRNONAA 39 (L) 03/01/2019 0905   GFRNONAA 40 (L) 12/17/2015 1457   GFRAA 45 (L) 03/01/2019 0905   GFRAA 46 (L) 12/17/2015 1457      Lab Results  Component Value Date   TSH 0.52 05/17/2012     RADIOGRAPHY: Pet, Initial (skull Base To Thigh)  Result Date: 03/08/2019 CLINICAL DATA:  Initial treatment strategy for diffuse large B-cell lymphoma of the right maxillofacial structures. EXAM: NUCLEAR MEDICINE PET SKULL BASE TO THIGH TECHNIQUE: 8.3 mCi F-18 FDG was injected intravenously. Full-ring PET imaging was performed from the skull base to thigh after the radiotracer. CT data was obtained and used for attenuation correction and anatomic localization. Fasting blood glucose: 88 mg/dl COMPARISON:  01/20/2019 maxillofacial and neck CT. FINDINGS: Mediastinal blood pool activity: SUV max 2.8 Liver activity: SUV max 3.4 NECK: The right maxillary sinus mass has substantially decreased in size, measuring 2.7 x 1.0 cm at the level of the eroded anterior right maxillary sinus wall and demonstrating low level uptake with max  SUV 2.8 (series 4/image 21), previously 5.6 x 4.2 cm on 01/20/2019 CT using similar measurement technique. No hypermetabolic lymph nodes in the neck. Incidental CT findings: Right maxillary sinus is now largely unopacified. CHEST: No enlarged or hypermetabolic axillary, mediastinal or hilar lymph nodes. No hypermetabolic pulmonary findings. Incidental CT findings: Small dependent bilateral pleural effusions, left greater than right. Cardiomegaly. Trace pericardial effusion/thickening. Coronary atherosclerosis. Atherosclerotic nonaneurysmal thoracic aorta. No acute consolidative airspace disease, lung masses or significant pulmonary nodules. ABDOMEN/PELVIS: No abnormal hypermetabolic activity within the liver, pancreas, adrenal glands, or spleen. No hypermetabolic lymph nodes in the abdomen or pelvis. Incidental  CT findings: Marked diffuse colonic diverticulosis. Mild prostatomegaly. Atherosclerotic nonaneurysmal thoracic aorta. SKELETON: No focal hypermetabolic activity to suggest skeletal metastasis. Incidental CT findings: none IMPRESSION: 1. Low level FDG uptake (max SUV 2.8, Deauville score 2) associated with the right maxillary sinus mass, which has substantially decreased in size since 01/20/2019 maxillofacial CT. 2. No hypermetabolic lymphadenopathy or other findings of metabolically active lymphoma in the neck, chest, abdomen or pelvis. 3. Cardiomegaly.  Small dependent bilateral pleural effusions. 4. Chronic findings include: Aortic Atherosclerosis (ICD10-I70.0). Coronary atherosclerosis. Marked diffuse colonic diverticulosis. Mild prostatomegaly. Electronically Signed   By: Ilona Sorrel M.D.   On: 03/08/2019 20:51      IMPRESSION/PLAN:  This is a delightful patient with head and neck cancer. I recommend radiotherapy for this patient with the goal of local control.  The patient's daughter understands that treatment will not necessarily cure him (though it could render clinical remission) and he will continue to be at risk for distant progression or relapse.  Treatment would be focused at the right sinus and face.  We discussed the potential risks, benefits, and side effects of radiotherapy. We talked in detail about acute and late effects. We discussed that some of the most bothersome acute effects may be mucositis, dysgeusia, salivary changes, skin irritation, hair loss, irritation to the eye , fatigue. We talked about late effects which include but are not necessarily limited to permanent injury to the irradiated tissues in the vicinity of the lymphoma. No guarantees of treatment were given.  The patient's daughter is enthusiastic about proceeding with treatment. I look forward to participating in the patient's care.    Simulation (treatment planning) will take place as soon as possible, anticipating 8  fractions of radiotherapy.  IMRT might be needed to spare his right eye adequately.  We also discussed that the treatment of head and neck cancer is a multidisciplinary process to maximize treatment outcomes and quality of life. For this reason the following referrals have been or will be made:   Nutritionist for nutrition support during and after treatment.  This encounter was provided by telemedicine platform MyChart video, with Ethel Rana - daughter.  Patient was unable to attend.  He lives in a senior community and attendance was not feasible for him.  The patient's daughter has given verbal consent for this type of encounter and has been advised to only accept a meeting of this type in a secure network environment. The time spent during this encounter was 25 minutes. The attendants for this meeting include Eppie Gibson  and [employer]. Also, [name], RN, our Head and Neck Oncology Navigator was present. During the encounter, Eppie Gibson was located at [facility] Radiation Oncology Department.  Ethel Rana was located at home.    __________________________________________   Eppie Gibson, MD   This document serves as a record of services personally performed  by Eppie Gibson, MD. It was created on her behalf by Wilburn Mylar, a trained medical scribe. The creation of this record is based on the scribe's personal observations and the provider's statements to them. This document has been checked and approved by the attending provider.

## 2019-03-10 ENCOUNTER — Telehealth: Payer: Self-pay | Admitting: *Deleted

## 2019-03-10 ENCOUNTER — Ambulatory Visit
Admission: RE | Admit: 2019-03-10 | Discharge: 2019-03-10 | Disposition: A | Discharge status: Home / Self Care | Payer: Medicare Other | Source: Ambulatory Visit | Attending: Radiation Oncology | Admitting: Radiation Oncology

## 2019-03-10 ENCOUNTER — Encounter: Payer: Self-pay | Admitting: Radiation Oncology

## 2019-03-10 ENCOUNTER — Other Ambulatory Visit: Payer: Self-pay

## 2019-03-10 ENCOUNTER — Encounter: Payer: Self-pay | Admitting: *Deleted

## 2019-03-10 ENCOUNTER — Telehealth: Payer: Self-pay | Admitting: Hematology

## 2019-03-10 DIAGNOSIS — C8599 Non-Hodgkin lymphoma, unspecified, extranodal and solid organ sites: Secondary | ICD-10-CM

## 2019-03-10 DIAGNOSIS — C8331 Diffuse large B-cell lymphoma, lymph nodes of head, face, and neck: Secondary | ICD-10-CM

## 2019-03-10 NOTE — Telephone Encounter (Signed)
Received call from patient's daughter regarding an upcoming appt with Dr. Maylon Peppers on 03/15/19.  She had a virtual appt with Dr. Lanell Persons today and does not feel her father needs another appt at this time. Checked with Dr. Maylon Peppers. He is ok not seeing pt next week. Will re-schedule for 3 months with labs. Dr. Maylon Peppers to order CT scan prior to that visit.  Scheduling message sent. TCT daughter and made her aware of the above.   She voiced understanding.

## 2019-03-10 NOTE — Telephone Encounter (Signed)
Scheduled appt per 11/20 sch message - called no answer and no vmail . Mailed letter with appt date and time

## 2019-03-11 ENCOUNTER — Other Ambulatory Visit: Payer: Self-pay | Admitting: Cardiology

## 2019-03-13 ENCOUNTER — Encounter: Payer: Self-pay | Admitting: Radiation Oncology

## 2019-03-13 ENCOUNTER — Other Ambulatory Visit: Payer: Self-pay

## 2019-03-13 ENCOUNTER — Encounter: Payer: Self-pay | Admitting: *Deleted

## 2019-03-13 ENCOUNTER — Ambulatory Visit
Admission: RE | Admit: 2019-03-13 | Discharge: 2019-03-13 | Disposition: A | Discharge status: Home / Self Care | Payer: Medicare Other | Source: Ambulatory Visit | Attending: Radiation Oncology | Admitting: Radiation Oncology

## 2019-03-13 ENCOUNTER — Other Ambulatory Visit: Payer: Self-pay | Admitting: Radiation Oncology

## 2019-03-13 DIAGNOSIS — Z510 Encounter for antineoplastic radiation therapy: Secondary | ICD-10-CM | POA: Insufficient documentation

## 2019-03-13 DIAGNOSIS — C8331 Diffuse large B-cell lymphoma, lymph nodes of head, face, and neck: Secondary | ICD-10-CM | POA: Diagnosis present

## 2019-03-13 DIAGNOSIS — C8599 Non-Hodgkin lymphoma, unspecified, extranodal and solid organ sites: Secondary | ICD-10-CM | POA: Insufficient documentation

## 2019-03-13 NOTE — Progress Notes (Signed)
Oncology Nurse Navigator Documentation  To provide support, encouragement and care continuity, met with Ralph Mendoza during his CT SIM. He was accompanied by his son-in-law d/t his [facility].   He tolerated procedure without difficulty, denied questions/concerns.   Provided Epic appt calendar to pt and son-in-law. I showed them LINAC 1 treatment area, explained procedures for lobby registration, arrival to Radiation Waiting, arrival to tmt area and preparation for tmt.  They voiced understanding.   I encouraged them to call me prior to 11/30 [place] if any questions.  [name], RN, BSN Head & Neck Oncology Nurse [place] at [place] [phone]

## 2019-03-13 NOTE — Progress Notes (Signed)
Oncology Nurse Navigator Documentation  Met with patient's daughter Ralph Mendoza during MyChart Video consult with Dr. Isidore Moos.  He was unavailable d/t residing at retirement facility.    . Further introduced myself as their Navigator, explained my role as a member of the Care Team.   . Again provided contact information. . Provided introductory explanation of radiation treatment including SIM planning and purpose of Aquaplast head and shoulder mask, showed her example.   . She stated she or her husband will provide transportation to appts. . She voiced understanding of plan for 8 tmts. . I encouraged her to contact me with questions/concerns as treatments/procedures begin.   [name], RN, BSN Head & Neck Oncology Nurse [place] at [place] [phone]

## 2019-03-15 ENCOUNTER — Other Ambulatory Visit: Payer: Medicare Other

## 2019-03-15 ENCOUNTER — Ambulatory Visit: Payer: Medicare Other | Admitting: Hematology

## 2019-03-15 ENCOUNTER — Encounter: Payer: Self-pay | Admitting: Radiation Oncology

## 2019-03-15 DIAGNOSIS — Z510 Encounter for antineoplastic radiation therapy: Secondary | ICD-10-CM | POA: Diagnosis not present

## 2019-03-15 NOTE — Progress Notes (Signed)
Head and Neck Cancer Simulation, IMRT treatment planning note   Outpatient  Diagnosis:    ICD-10-CM   1. Diffuse large b-cell lymphoma, lymph nodes of head, face, and neck (HCC)  C83.31     The patient was taken to the CT simulator and identity was confirmed.  All relevant records and images related to the planned course of therapy were reviewed.  The patient freely provided informed written consent to proceed with treatment after reviewing the details related to the planned course of therapy. The consent form was witnessed and verified by the simulation staff.    The patient was laid in the supine position on the table. An Aquaplast head and shoulder mask was custom fitted to the patient's anatomy. High-resolution CT axial imaging was obtained of the head and neck with contrast. I verified that the quality of the imaging is good for treatment planning. 1 Medically Necessary Treatment Device was fabricated and supervised by me: Aquaplast mask.  Treatment planning note I plan to treat the patient with IMRT. I plan to treat the patient's tumor/right maxillary sinus with margins.  I plan to treat to a total dose of 24 Gray in 8  fractions. Dose calculation was ordered from dosimetry.  IMRT planning Note  IMRT is medically necessary and an important modality to deliver adequate dose to the patient's at risk tissues while sparing the patient's normal structures, including the: oral tongue, globes, brain, oral cavity. This justifies the use of IMRT in the patient's treatment.    -----------------------------------  Ralph Gibson, MD

## 2019-03-20 ENCOUNTER — Ambulatory Visit
Admission: RE | Admit: 2019-03-20 | Discharge: 2019-03-20 | Disposition: A | Discharge status: Home / Self Care | Payer: Medicare Other | Source: Ambulatory Visit | Attending: Radiation Oncology | Admitting: Radiation Oncology

## 2019-03-20 ENCOUNTER — Other Ambulatory Visit: Payer: Self-pay

## 2019-03-20 DIAGNOSIS — Z510 Encounter for antineoplastic radiation therapy: Secondary | ICD-10-CM | POA: Diagnosis not present

## 2019-03-21 ENCOUNTER — Ambulatory Visit: Payer: Medicare Other | Admitting: Cardiology

## 2019-03-21 ENCOUNTER — Ambulatory Visit
Admission: RE | Admit: 2019-03-21 | Discharge: 2019-03-21 | Disposition: A | Discharge status: Home / Self Care | Payer: Medicare Other | Source: Ambulatory Visit | Attending: Radiation Oncology | Admitting: Radiation Oncology

## 2019-03-21 ENCOUNTER — Other Ambulatory Visit: Payer: Self-pay

## 2019-03-21 DIAGNOSIS — C8331 Diffuse large B-cell lymphoma, lymph nodes of head, face, and neck: Secondary | ICD-10-CM | POA: Insufficient documentation

## 2019-03-21 DIAGNOSIS — Z510 Encounter for antineoplastic radiation therapy: Secondary | ICD-10-CM | POA: Diagnosis present

## 2019-03-22 ENCOUNTER — Other Ambulatory Visit: Payer: Self-pay

## 2019-03-22 ENCOUNTER — Ambulatory Visit
Admission: RE | Admit: 2019-03-22 | Discharge: 2019-03-22 | Disposition: A | Discharge status: Home / Self Care | Payer: Medicare Other | Source: Ambulatory Visit | Attending: Radiation Oncology | Admitting: Radiation Oncology

## 2019-03-22 DIAGNOSIS — Z510 Encounter for antineoplastic radiation therapy: Secondary | ICD-10-CM | POA: Diagnosis not present

## 2019-03-23 ENCOUNTER — Ambulatory Visit
Admission: RE | Admit: 2019-03-23 | Discharge: 2019-03-23 | Disposition: A | Discharge status: Home / Self Care | Payer: Medicare Other | Source: Ambulatory Visit | Attending: Radiation Oncology | Admitting: Radiation Oncology

## 2019-03-23 DIAGNOSIS — Z510 Encounter for antineoplastic radiation therapy: Secondary | ICD-10-CM | POA: Diagnosis not present

## 2019-03-24 ENCOUNTER — Other Ambulatory Visit: Payer: Self-pay

## 2019-03-24 ENCOUNTER — Ambulatory Visit
Admission: RE | Admit: 2019-03-24 | Discharge: 2019-03-24 | Disposition: A | Discharge status: Home / Self Care | Payer: Medicare Other | Source: Ambulatory Visit | Attending: Radiation Oncology | Admitting: Radiation Oncology

## 2019-03-24 DIAGNOSIS — Z510 Encounter for antineoplastic radiation therapy: Secondary | ICD-10-CM | POA: Diagnosis not present

## 2019-03-27 ENCOUNTER — Ambulatory Visit
Admission: RE | Admit: 2019-03-27 | Discharge: 2019-03-27 | Disposition: A | Discharge status: Home / Self Care | Payer: Medicare Other | Source: Ambulatory Visit | Attending: Radiation Oncology | Admitting: Radiation Oncology

## 2019-03-27 ENCOUNTER — Other Ambulatory Visit: Payer: Self-pay

## 2019-03-27 ENCOUNTER — Inpatient Hospital Stay: Payer: Medicare Other | Attending: Hematology | Admitting: Nutrition

## 2019-03-27 ENCOUNTER — Other Ambulatory Visit: Payer: Self-pay | Admitting: Radiation Oncology

## 2019-03-27 DIAGNOSIS — Z510 Encounter for antineoplastic radiation therapy: Secondary | ICD-10-CM | POA: Diagnosis not present

## 2019-03-27 DIAGNOSIS — C8331 Diffuse large B-cell lymphoma, lymph nodes of head, face, and neck: Secondary | ICD-10-CM

## 2019-03-27 NOTE — Progress Notes (Signed)
85 year old male diagnosed with B-cell lymphoma of the right maxillary sinus.  He is to receive 8 fractions of radiation therapy.  Past medical history includes peptic ulcer disease, hypertension, tobacco, and atrial fib.  Medications include Lasix, multivitamin, and K. Dur.  Labs include glucose 101, BUN 24, and creatinine 1.49 on November 11.  Height: 5 feet 10-1/2 inches. Weight: 166.3 pounds November 11. Patient reports his weight was 170.8 pounds today. Usual body weight: 173 pounds in September 2020. BMI: 23.52.  Patient denies any nutrition impact symptoms. Reports his appetite is good and he is eating 3 meals a day at assisted living.  They prepare his food for him. He will occasionally snack on things like pie. He declines oral nutrition supplements.  Nutrition diagnosis: Food and nutrition related knowledge deficit related to new diagnosis of B-cell lymphoma and associated treatments as evidenced by no prior need for nutrition related information.  Intervention: I encouraged patient to continue strategies for adequate calorie and protein intake for weight maintenance. Educated him on high-protein foods and encouraged him to consider snacks if he noted weight loss. Provided fact sheets.  Contact information was given.  Monitoring, evaluation, goals: Patient will tolerate adequate calories and protein for weight maintenance.  No follow-up is necessary.  Nutrition diagnosis resolved.  **Disclaimer: This note was dictated with voice recognition software. Similar sounding words can inadvertently be transcribed and this note may contain transcription errors which may not have been corrected upon publication of note.**

## 2019-03-28 ENCOUNTER — Ambulatory Visit
Admission: RE | Admit: 2019-03-28 | Discharge: 2019-03-28 | Disposition: A | Discharge status: Home / Self Care | Payer: Medicare Other | Source: Ambulatory Visit | Attending: Radiation Oncology | Admitting: Radiation Oncology

## 2019-03-28 ENCOUNTER — Telehealth: Payer: Self-pay | Admitting: Hematology

## 2019-03-28 ENCOUNTER — Other Ambulatory Visit: Payer: Self-pay

## 2019-03-28 DIAGNOSIS — Z510 Encounter for antineoplastic radiation therapy: Secondary | ICD-10-CM | POA: Diagnosis not present

## 2019-03-28 NOTE — Telephone Encounter (Signed)
R/s appt per 12/08 sch message- pt son in law is aware of appt date and time

## 2019-03-29 ENCOUNTER — Other Ambulatory Visit: Payer: Self-pay

## 2019-03-29 ENCOUNTER — Encounter: Payer: Self-pay | Admitting: Radiation Oncology

## 2019-03-29 ENCOUNTER — Ambulatory Visit
Admission: RE | Admit: 2019-03-29 | Discharge: 2019-03-29 | Disposition: A | Discharge status: Home / Self Care | Payer: Medicare Other | Source: Ambulatory Visit | Attending: Radiation Oncology | Admitting: Radiation Oncology

## 2019-03-29 DIAGNOSIS — Z510 Encounter for antineoplastic radiation therapy: Secondary | ICD-10-CM | POA: Diagnosis not present

## 2019-04-11 ENCOUNTER — Ambulatory Visit
Admission: RE | Admit: 2019-04-11 | Discharge: 2019-04-11 | Disposition: A | Discharge status: Home / Self Care | Payer: Medicare Other | Source: Ambulatory Visit | Attending: Radiation Oncology | Admitting: Radiation Oncology

## 2019-04-11 DIAGNOSIS — C8331 Diffuse large B-cell lymphoma, lymph nodes of head, face, and neck: Secondary | ICD-10-CM

## 2019-04-11 NOTE — Progress Notes (Signed)
Radiation Oncology         [phone] ________________________________  Name: Ralph Mendoza MRN: [id number]  Date: 04/11/2019  DOB: 1920-08-08  Follow-Up Visit Note by telephone as patient was unable to access MyChart video during pandemic precautions   CC: Ralph Neer, MD  Ralph Men, MD  Diagnosis and Prior Radiotherapy:       ICD-10-CM   1. Diffuse large b-cell lymphoma, lymph nodes of head, face, and neck (HCC)  C83.31    CHIEF COMPLAINT:  Here for follow-up and surveillance of lymphoma  Narrative:  The patient returns today for routine follow-up.  He has a small sore on the hard palate, but he is eating well.  He denies any new symptoms or side effects from the radiotherapy. No new masses or swelling.                   ALLERGIES:  has No Known Allergies.  Meds: Current Outpatient Medications  Medication Sig Dispense Refill  . acetaminophen (TYLENOL) 500 MG tablet Take 1,000 mg by mouth every 6 (six) hours as needed for mild pain or moderate pain.    [name] Dextromethorphan-Guaifenesin (DELSYM COUGH/CHEST CONGEST DM PO) Take 10 mLs by mouth daily as needed (for cough).     . diazepam (VALIUM) 5 MG tablet Take 0.5 tablets (2.5 mg total) by mouth every 8 (eight) hours as needed for anxiety. (Patient not taking: Reported on 03/10/2019) 10 tablet 0  . ELIQUIS 2.5 MG TABS tablet TAKE 1 TABLET TWICE A DAY 180 tablet 1  . furosemide (LASIX) 20 MG tablet Take 40 mg by mouth daily.    . isosorbide dinitrate (ISORDIL) 30 MG tablet TAKE 1 TABLET DAILY 90 tablet 2  . losartan (COZAAR) 25 MG tablet TAKE 1 TABLET DAILY 90 tablet 2  . Multiple Vitamins-Minerals (PRESERVISION AREDS 2+MULTI VIT) CAPS Take 1 capsule by mouth daily.    . potassium chloride SA (K-DUR) 20 MEQ tablet Take 1 tablet (20 mEq total) by mouth daily. 90 tablet 3   No current facility-administered medications for this encounter.    Physical Findings:  Wt Readings from Last 3 Encounters:  03/01/19 166 lb 4.8 oz  (75.4 kg)  02/10/19 175 lb (79.4 kg)  12/21/18 173 lb (78.5 kg)    vitals were not taken for this visit. .  General: Alert and oriented, in no acute distress Psychiatric: Judgment and insight are intact. Affect is appropriate.   Lab Findings: Lab Results  Component Value Date   WBC 4.2 03/01/2019   HGB 12.1 (L) 03/01/2019   HCT 35.9 (L) 03/01/2019   MCV 91.3 03/01/2019   PLT 146 (L) 03/01/2019    Lab Results  Component Value Date   TSH 0.52 05/17/2012    Radiographic Findings:  No results found.  Impression/Plan:    1) Head and Neck Cancer Status: healing well from RT per patient and son in law.  2) Nutritional Status: no complaints, eating well.  3) Swallowing: no issues   4) Plan: Follow-up in mid March after PET scan for restaging. The patient was encouraged to call with any issues or questions before then. He knows to contact me if the sore on the roof of his mouth does not heal in the next few weeks.  This encounter was provided by telemedicine platform by telephone as patient was unable to access MyChart video during pandemic precautions. The patient has given verbal consent for this type of encounter and has been advised to only  accept a meeting of this type in a secure network environment. The time spent during this encounter was 16 minutes. The attendants for this meeting include Eppie Gibson  and [name].  During the encounter, Eppie Gibson was located at [facility] Radiation Oncology Department.  [name] was located at home.  _____________________________________   Eppie Gibson, MD

## 2019-05-04 ENCOUNTER — Other Ambulatory Visit: Payer: Self-pay | Admitting: Cardiology

## 2019-05-10 ENCOUNTER — Other Ambulatory Visit: Payer: Medicare Other

## 2019-05-13 NOTE — Progress Notes (Signed)
  Patient Name: Ralph Mendoza MRN: [id number] DOB: [birth year] Referring Physician: [name] Date of Service: 03/29/2019 [facility] [place]                                                        End Of Treatment Note  Diagnoses: C83.31-Diffuse large b-cell lymphoma, lymph nodes of head, face, and neck  Cancer Staging: Cancer Staging Diffuse large b-cell lymphoma, lymph nodes of head, face, and neck ([place]) Staging form: Hodgkin and Non-Hodgkin Lymphoma, AJCC 6th Edition - Clinical stage from 03/10/2019: Stage IE - Signed by Eppie Gibson, MD on 03/13/2019  Intent: Palliative  Radiation Treatment Dates: 03/20/2019 through 03/29/2019 Site Technique Total Dose (Gy) Dose per Fx (Gy) Completed Fx Beam Energies  Maxillary Antrum: HN_R_Max IMRT 24/24 3 8/8 6X   Narrative: The patient tolerated radiation therapy relatively well.   Plan: The patient will follow-up with radiation oncology in 1 mo, or as needed.  ________________ _____   Eppie Gibson, MD

## 2019-05-21 ENCOUNTER — Ambulatory Visit: Payer: Medicare Other

## 2019-05-26 ENCOUNTER — Ambulatory Visit: Payer: Medicare Other

## 2019-05-28 ENCOUNTER — Ambulatory Visit: Payer: Medicare Other

## 2019-06-14 ENCOUNTER — Ambulatory Visit: Payer: Medicare Other | Admitting: Hematology

## 2019-06-14 ENCOUNTER — Other Ambulatory Visit: Payer: Medicare Other

## 2019-06-27 ENCOUNTER — Telehealth: Payer: Self-pay | Admitting: *Deleted

## 2019-06-27 NOTE — Telephone Encounter (Signed)
CALLED PATIENT TO INFORM OF PET SCAN FOR 07-05-19 - ARRIVAL TIME- 9:30 AM @ [facility], PT. TO BE NPO- 6 HRS. PRIOR TO TEST, PATIENT TO AVOID CARBS AND SUGARS PRIOR TO TEST, PATIENT TO RECEIVE RESULTS FROM DR. SQUIRE ON 07-07-19 @ 2:40 PM, LVM FOR A RETURN CALL

## 2019-07-03 ENCOUNTER — Telehealth: Payer: Self-pay | Admitting: *Deleted

## 2019-07-03 NOTE — Telephone Encounter (Signed)
Called patient to inform of Pet Scan for 07-05-19 - arrival time- 9:30 am @ [facility], , patient to be NPO- 6 hrs. prior to test, patient to avoid carbs and sugars last meal day before scan and patient to receive results from Dr. Isidore Moos on 07-07-19, spoke with patient's son-in-law Richardson Landry and he is aware of these appts.

## 2019-07-04 ENCOUNTER — Other Ambulatory Visit: Payer: Self-pay

## 2019-07-04 ENCOUNTER — Ambulatory Visit: Payer: Self-pay | Admitting: Radiation Oncology

## 2019-07-04 ENCOUNTER — Ambulatory Visit (HOSPITAL_COMMUNITY)
Admission: RE | Admit: 2019-07-04 | Discharge: 2019-07-04 | Disposition: A | Discharge status: Home / Self Care | Payer: Medicare Other | Source: Ambulatory Visit | Attending: Radiation Oncology | Admitting: Radiation Oncology

## 2019-07-04 DIAGNOSIS — I2510 Atherosclerotic heart disease of native coronary artery without angina pectoris: Secondary | ICD-10-CM | POA: Diagnosis not present

## 2019-07-04 DIAGNOSIS — I700 Atherosclerosis of aorta: Secondary | ICD-10-CM | POA: Insufficient documentation

## 2019-07-04 DIAGNOSIS — C8331 Diffuse large B-cell lymphoma, lymph nodes of head, face, and neck: Secondary | ICD-10-CM

## 2019-07-04 DIAGNOSIS — M5136 Other intervertebral disc degeneration, lumbar region: Secondary | ICD-10-CM | POA: Diagnosis not present

## 2019-07-04 DIAGNOSIS — I517 Cardiomegaly: Secondary | ICD-10-CM | POA: Diagnosis not present

## 2019-07-04 DIAGNOSIS — K5730 Diverticulosis of large intestine without perforation or abscess without bleeding: Secondary | ICD-10-CM | POA: Insufficient documentation

## 2019-07-04 DIAGNOSIS — M47816 Spondylosis without myelopathy or radiculopathy, lumbar region: Secondary | ICD-10-CM | POA: Insufficient documentation

## 2019-07-04 DIAGNOSIS — J90 Pleural effusion, not elsewhere classified: Secondary | ICD-10-CM | POA: Insufficient documentation

## 2019-07-04 LAB — GLUCOSE, CAPILLARY: Glucose-Capillary: 92 mg/dL (ref 70–99)

## 2019-07-04 MED ORDER — FLUDEOXYGLUCOSE F - 18 (FDG) INJECTION
7.4900 | Freq: Once | INTRAVENOUS | Status: AC | PRN
  Administered 2019-07-04: 7.49 via INTRAVENOUS

## 2019-07-05 ENCOUNTER — Encounter (HOSPITAL_COMMUNITY): Admission: RE | Admit: 2019-07-05 | Payer: Medicare Other | Source: Ambulatory Visit

## 2019-07-05 NOTE — Progress Notes (Signed)
HPI: FU atrial fibrillation. We have elected rate control and anticoagulation for afib. Holter monitor in February of 2014 showed sinus rhythm, mobitz 1; toprol DCed. Nuclear study September 2017 showed ejection fraction 28%. No ischemia. Fixed apical defect. Patient had CT scan January 2018 that showed large right and small left pleural effusion. Patient had thoracentesis January 18.Cytology negative.Patient had repeat thoracentesis June 2018.Fluidnot sent for analysis.Echocardiogram February 2019 described as diffuse hypokinesis with ejection fraction similar to study in August 2017 which was 25 to 30%. There was biatrial enlargement and moderate tricuspid regurgitation. 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  Since last seen,patient denies dyspnea, chest pain, palpitations, syncope or bleeding.  Current Outpatient Medications  Medication Sig Dispense Refill  . acetaminophen (TYLENOL) 500 MG tablet Take 1,000 mg by mouth every 6 (six) hours as needed for mild pain or moderate pain.    [name] Dextromethorphan-Guaifenesin (DELSYM COUGH/CHEST CONGEST DM PO) Take 10 mLs by mouth daily as needed (for cough).     [name] ELIQUIS 2.5 MG TABS tablet TAKE 1 TABLET TWICE A DAY 180 tablet 1  . furosemide (LASIX) 20 MG tablet Take 40 mg by mouth daily.    . isosorbide dinitrate (ISORDIL) 30 MG tablet TAKE 1 TABLET DAILY 90 tablet 2  . losartan (COZAAR) 25 MG tablet Take 1 tablet (25 mg total) by mouth daily. 10 tablet 0  . Multiple Vitamins-Minerals (PRESERVISION AREDS 2+MULTI VIT) CAPS Take 1 capsule by mouth daily.    [name] oxyCODONE (OXY IR/ROXICODONE) 5 MG immediate release tablet Take 5 mg by mouth 4 (four) times daily as needed.    . potassium chloride SA (K-DUR) 20 MEQ tablet Take 1 tablet (20 mEq total) by mouth daily. 90 tablet 3   No current facility-administered medications for this visit.     Past Medical History:  Diagnosis Date  . Atrial fibrillation  ([place])   . BPH (benign prostatic hyperplasia)   . Dyspnea on exertion    a. 09/2008 NL EF on myoview.  [name] Hearing difficulty    R sided hole in eardrum, only wears L hearing aid  . History of tobacco abuse   . Hypertension   . Lumbar stenosis with neurogenic claudication    a. 02/2011 s/p decompressive laminectomy.  . Midsternal chest pain    a. 09/2008 Myoview: EF 56%, mild fixed thinning of inf wall particularly @ base -? attenuation.  No ischemia  . PUD (peptic ulcer disease)    Remote, history of  . Urinary hesitancy     Past Surgical History:  Procedure Laterality Date  . HERNIA REPAIR     Left  . LUMBAR LAMINECTOMY/DECOMPRESSION MICRODISCECTOMY  03/04/2011   Procedure: LUMBAR LAMINECTOMY/DECOMPRESSION MICRODISCECTOMY;  Surgeon: Ophelia Charter;  Location: [facility];  Service: Neurosurgery;  Laterality: N/A;  LUMBAR THREE-FOUR ,FOUR-FIVE LAMINECTOMIES  . TYMPANOMASTOIDECTOMY     Left modified radical with type 3 tympanoplasty    Social History   Socioeconomic History  . Marital status: Widowed    Spouse name: Not on file  . Number of children: 2  . Years of education: Not on file  . Highest education level: Not on file  Occupational History  . Occupation: [employer]    Comment: Retired  Tobacco Use  . Smoking status: Former Research scientist (life sciences)  . Smokeless tobacco: Never Used  . Tobacco comment: Remote history of smoking  Substance and Sexual Activity  . Alcohol use: No  . Drug  use: No  . Sexual activity: Not on file  Other Topics Concern  . Not on file  Social History Narrative   Lives with his wife in [place].   He swims daily.   He has 2 grown daughters.   1 Caffeine drink daily    Social Determinants of Health   Financial Resource Strain:   . Difficulty of Paying Living Expenses:   Food Insecurity:   . Worried About Charity fundraiser in the Last Year:   . Arboriculturist in the Last Year:   Transportation Needs: No Transportation Needs  . Lack  of Transportation (Medical): No  . Lack of Transportation (Non-Medical): No  Physical Activity:   . Days of Exercise per Week:   . Minutes of Exercise per Session:   Stress:   . Feeling of Stress :   Social Connections:   . Frequency of Communication with Friends and Family:   . Frequency of Social Gatherings with Friends and Family:   . Attends Religious Services:   . Active Member of Clubs or Organizations:   . Attends Archivist Meetings:   [name] Marital Status:   Intimate Partner Violence: Not At Risk  . Fear of Current or Ex-Partner: No  . Emotionally Abused: No  . Physically Abused: No  . Sexually Abused: No    Family History  Problem Relation Age of Onset  . Lung cancer Mother   . Appendicitis Father   . Diabetes Sister        DM  . Coronary artery disease Neg Hx        No premature CAD  . Colon cancer Neg Hx     ROS: Decreased energy and near blindness but no fevers or chills, productive cough, hemoptysis, dysphasia, odynophagia, melena, hematochezia, dysuria, hematuria, rash, seizure activity, orthopnea, PND, pedal edema, claudication. Remaining systems are negative.  Physical Exam: Well-developed frail in no acute distress.  Skin is warm and dry.  HEENT is normal.  Neck is supple.  Chest is clear to auscultation with normal expansion.  Cardiovascular exam is irregular Abdominal exam nontender or distended. No masses palpated. Extremities show no edema. neuro grossly intact   A/P  1 chronic systolic congestive heart failure-patient's volume status appears to be stable compared to last office visit.  We will continue with Lasix at present dose.  Check potassium and renal function.  2 permanent atrial fibrillation-heart rate is controlled on no medications.  Continue apixaban. Check hgb.  3 nonischemic cardiomyopathy-given patient's age we are managing conservatively.  Continue low-dose ARB.  He is not on beta-blockade due to history of bradycardia.   4 hypertension-patient's blood pressure is controlled.  5 history of right pleural effusion-possibly secondary to CHF.  Continue diuretics at present dose.  6 high-grade B-cell lymphoma maxillary sinus-Per oncology.    Kirk Ruths, MD

## 2019-07-07 ENCOUNTER — Ambulatory Visit
Admission: RE | Admit: 2019-07-07 | Discharge: 2019-07-07 | Disposition: A | Discharge status: Home / Self Care | Payer: Medicare Other | Source: Ambulatory Visit | Attending: Radiation Oncology | Admitting: Radiation Oncology

## 2019-07-07 DIAGNOSIS — C8331 Diffuse large B-cell lymphoma, lymph nodes of head, face, and neck: Secondary | ICD-10-CM

## 2019-07-11 ENCOUNTER — Encounter: Payer: Self-pay | Admitting: Radiation Oncology

## 2019-07-11 NOTE — Progress Notes (Signed)
Radiation Oncology         [phone] ________________________________  Name: Ralph Mendoza MRN: [id number]  Date: 07/07/2019  DOB: 1920-11-14  Follow-Up Visit Note by telephone.  The patient opted for telemedicine to maximize safety during the pandemic.  MyChart video was not obtainable.  Outpatient  CC: Ralph Neer, MD  [name], MD  Diagnosis and Prior Radiotherapy:    ICD-10-CM   1. Diffuse large b-cell lymphoma, lymph nodes of head, face, and neck (HCC)  C83.31     Cancer Staging: Cancer Staging Diffuse large b-cell lymphoma, lymph nodes of head, face, and neck (HCC) Staging form: Hodgkin and Non-Hodgkin Lymphoma, AJCC 6th Edition - Clinical stage from 03/10/2019: Stage IE - Signed by Eppie Gibson, MD on 03/13/2019  Radiation Treatment Dates: 03/20/2019 through 03/29/2019 Site Technique Total Dose (Gy) Dose per Fx (Gy) Completed Fx Beam Energies  Maxillary Antrum: HN_R_Max IMRT 24/24 3 8/8 6X   CHIEF COMPLAINT: Here for follow-up and surveillance of sinus cancer  Narrative: I spoke with the patient and his son-in-law by phone today.  The patient is feeling very well.  He denies any lingering side effects from radiotherapy.  He has been eating well and swallowing well.  Denies any taste changes.  Denies any new facial masses or facial symptoms/sinus pain.  No concerning skin changes over the face.     PET scan images were reviewed by me personally.  I shared the results, which clinically show that he is in remission.                         ALLERGIES:  has No Known Allergies.  Meds: Current Outpatient Medications  Medication Sig Dispense Refill   acetaminophen (TYLENOL) 500 MG tablet Take 1,000 mg by mouth every 6 (six) hours as needed for mild pain or moderate pain.     Dextromethorphan-Guaifenesin (DELSYM COUGH/CHEST CONGEST DM PO) Take 10 mLs by mouth daily as needed (for cough).      diazepam (VALIUM) 5 MG tablet Take 0.5 tablets (2.5 mg total) by mouth  every 8 (eight) hours as needed for anxiety. (Patient not taking: Reported on 03/10/2019) 10 tablet 0   ELIQUIS 2.5 MG TABS tablet TAKE 1 TABLET TWICE A DAY 180 tablet 1   furosemide (LASIX) 20 MG tablet Take 40 mg by mouth daily.     isosorbide dinitrate (ISORDIL) 30 MG tablet TAKE 1 TABLET DAILY 90 tablet 2   losartan (COZAAR) 25 MG tablet Take 1 tablet (25 mg total) by mouth daily. 10 tablet 0   Multiple Vitamins-Minerals (PRESERVISION AREDS 2+MULTI VIT) CAPS Take 1 capsule by mouth daily.     potassium chloride SA (K-DUR) 20 MEQ tablet Take 1 tablet (20 mEq total) by mouth daily. 90 tablet 3   No current facility-administered medications for this encounter.    Physical Findings: The patient is in no acute distress. Patient is alert and oriented.  vitals were not taken for this visit. .      Lab Findings: Lab Results  Component Value Date   WBC 4.2 03/01/2019   HGB 12.1 (L) 03/01/2019   HCT 35.9 (L) 03/01/2019   MCV 91.3 03/01/2019   PLT 146 (L) 03/01/2019    Radiographic Findings: NM PET Image Restag (PS) Skull Base To Thigh  Result Date: 07/04/2019 CLINICAL DATA:  Subsequent treatment strategy for large B-cell lymphoma of the right maxillofacial region. The patient received radiation therapy from 03/20/2019 to 03/29/2019. EXAM:  NUCLEAR MEDICINE PET SKULL BASE TO THIGH TECHNIQUE: 7.5 mCi F-18 FDG was injected intravenously. Full-ring PET imaging was performed from the skull base to thigh after the radiotracer. CT data was obtained and used for attenuation correction and anatomic localization. Fasting blood glucose: 92 mg/dl COMPARISON:  Multiple exams, including 03/08/2019 FINDINGS: Mediastinal blood pool activity: SUV max 2.3 Liver activity: SUV max 3.9 NECK: Demineralization of the anterior wall the right maxillary sinus with mild soft tissue prominence in this vicinity with a very soft tissue thickening up to 0.8 cm (formerly 1.1 cm) with maximum SUV in this region 3.0  (formerly 2.8). This is currently Deauville 2 activity and was previously Deauville 2 activity. There is a small amount of regional dental activity. No hypermetabolic adenopathy in the neck. Incidental CT findings: Bilateral common carotid atherosclerotic calcification. Left mastoidectomy. CHEST: No significant abnormal hypermetabolic activity in this region. Incidental CT findings: Coronary, aortic arch, and branch vessel atherosclerotic vascular disease. Mild cardiomegaly. Trace bilateral pleural effusions. Faint scarring posteriorly in the right upper lobe. ABDOMEN/PELVIS: Physiologic activity in bowel. No significant abnormal hypermetabolic activity in this region. Incidental CT findings: Aortoiliac atherosclerotic vascular disease. No splenomegaly. Sigmoid colon diverticulosis. SKELETON: Accentuated activity along the spine extensive spurring at the L4-5 level, probably inflammatory, more notable on the right side where maximum SUV is currently 5.5. Accentuated activity along joint capsule of the glenohumeral joints and hip joints probably from arthropathy. Incidental CT findings: Bridging spurring of the sacroiliac joints. Dextroconvex lumbar scoliosis. Multilevel lumbar spondylosis and degenerative disc disease. Postoperative findings at L3-4 and L4-5 with posterior decompression at L4. IMPRESSION: 1. Demineralization of the anterior wall the right maxillary sinus with mild soft tissue thickening in this vicinity (currently 0.8 cm, formerly 1.1 cm) persistent over L2 activity in this region. 2. No adenopathy or other regions of suspicion for malignancy. 3. Other imaging findings of potential clinical significance: Aortic Atherosclerosis (ICD10-I70.0). Coronary atherosclerosis. Mild cardiomegaly. Trace bilateral pleural effusions. Sigmoid colon diverticulosis. Lumbar spondylosis and degenerative disc disease with some likely inflammatory activity along the prominent marginal spurring at the L4-5 level.  Electronically Signed   By: Van Clines M.D.   On: 07/04/2019 14:36    Impression/Plan: This is a delightful 86 year old gentleman with a history of lymphoma of the right maxillary sinus - he tolerated radiation therapy very well to the right maxillary sinus and is doing well symptomatically.  PET scan demonstrates no evidence of progressive or recurrent disease and clinically he is in remission.  Due to the patient's age and functional status he never received chemotherapy for his lymphoma.  He has follow-up with medical oncology in May.  I told the patient and his son-in-law that he will benefit from physical exams and lab work intermittently at the discretion of medical oncology for surveillance.  I will see him back on a as needed basis.  He is pleased with this plan and knows not to hesitate to call if he has any questions or issues in the future.  This encounter was provided by telemedicine platform by telephone.  The patient opted for telemedicine to maximize safety during the pandemic.  MyChart video was not obtainable. The patient has given verbal consent for this type of encounter and has been advised to only accept a meeting of this type in a secure network environment. On date of service, in total, I spent 20 minutes on this encounter. The attendants for this meeting include Eppie Gibson  and [name].  During the encounter,  Eppie Gibson was located at [facility] Radiation Oncology Department.  [name] was located at home.   _____________________________________   Eppie Gibson, MD

## 2019-07-13 ENCOUNTER — Other Ambulatory Visit: Payer: Self-pay

## 2019-07-13 ENCOUNTER — Encounter: Payer: Self-pay | Admitting: Cardiology

## 2019-07-13 ENCOUNTER — Ambulatory Visit: Payer: Medicare Other | Admitting: Cardiology

## 2019-07-13 VITALS — BP 110/56 | HR 63 | Ht 70.5 in | Wt 165.4 lb

## 2019-07-13 DIAGNOSIS — I5022 Chronic systolic (congestive) heart failure: Secondary | ICD-10-CM

## 2019-07-13 DIAGNOSIS — I428 Other cardiomyopathies: Secondary | ICD-10-CM

## 2019-07-13 DIAGNOSIS — I4821 Permanent atrial fibrillation: Secondary | ICD-10-CM | POA: Diagnosis not present

## 2019-07-13 DIAGNOSIS — I10 Essential (primary) hypertension: Secondary | ICD-10-CM | POA: Diagnosis not present

## 2019-07-13 LAB — CBC
Hematocrit: 33.7 % — ABNORMAL LOW (ref 37.5–51.0)
Hemoglobin: 11.7 g/dL — ABNORMAL LOW (ref 13.0–17.7)
MCH: 32.3 pg (ref 26.6–33.0)
MCHC: 34.7 g/dL (ref 31.5–35.7)
MCV: 93 fL (ref 79–97)
Platelets: 146 10*3/uL — ABNORMAL LOW (ref 150–450)
RBC: 3.62 x10E6/uL — ABNORMAL LOW (ref 4.14–5.80)
RDW: 13.2 % (ref 11.6–15.4)
WBC: 4.3 10*3/uL (ref 3.4–10.8)

## 2019-07-13 LAB — BASIC METABOLIC PANEL
BUN/Creatinine Ratio: 16 (ref 10–24)
BUN: 24 mg/dL (ref 10–36)
CO2: 26 mmol/L (ref 20–29)
Calcium: 9.1 mg/dL (ref 8.6–10.2)
Chloride: 99 mmol/L (ref 96–106)
Creatinine, Ser: 1.46 mg/dL — ABNORMAL HIGH (ref 0.76–1.27)
GFR calc Af Amer: 46 mL/min/{1.73_m2} — ABNORMAL LOW (ref >59)
GFR calc non Af Amer: 40 mL/min/{1.73_m2} — ABNORMAL LOW (ref >59)
Glucose: 136 mg/dL — ABNORMAL HIGH (ref 65–99)
Potassium: 3.7 mmol/L (ref 3.5–5.2)
Sodium: 138 mmol/L (ref 134–144)

## 2019-07-13 NOTE — Patient Instructions (Signed)
Medication Instructions:  NO CHANGE *If you need a refill on your cardiac medications before your next appointment, please call your pharmacy*   Lab Work: Your physician recommends that you HAVE LAB WORK TODAY If you have labs (blood work) drawn today and your tests are completely normal, you will receive your results only by: . MyChart Message (if you have MyChart) OR . A paper copy in the mail If you have any lab test that is abnormal or we need to change your treatment, we will call you to review the results.   Follow-Up: At [facility], you and your health needs are our priority.  As part of our continuing mission to provide you with exceptional heart care, we have created designated Provider Care Teams.  These Care Teams include your primary Cardiologist (physician) and Advanced Practice Providers (APPs -  Physician Assistants and Nurse Practitioners) who all work together to provide you with the care you need, when you need it.  We recommend signing up for the patient portal called "MyChart".  Sign up information is provided on this After Visit Summary.  MyChart is used to connect with patients for Virtual Visits (Telemedicine).  Patients are able to view lab/test results, encounter notes, upcoming appointments, etc.  Non-urgent messages can be sent to your provider as well.   To learn more about what you can do with MyChart, go to [email or web address].    Your next appointment:   6 month(s)  The format for your next appointment:   Either In Person or Virtual  Provider:   You may see Brian Crenshaw, MD or one of the following Advanced Practice Providers on your designated Care Team:    Luke Kilroy, PA-C  Callie Goodrich, PA-C  Jesse Cleaver, FNP     

## 2019-07-19 ENCOUNTER — Telehealth: Payer: Self-pay | Admitting: Hematology

## 2019-07-19 NOTE — Telephone Encounter (Signed)
Scheduled appt per 3/31 sch message - pt son in law aware of new appt

## 2019-07-31 ENCOUNTER — Other Ambulatory Visit: Payer: Self-pay | Admitting: *Deleted

## 2019-07-31 MED ORDER — LOSARTAN POTASSIUM 25 MG PO TABS: 25.0000 mg | ORAL_TABLET | Freq: Every day | ORAL | 1 refills | Status: DC

## 2019-08-04 ENCOUNTER — Other Ambulatory Visit: Payer: Self-pay

## 2019-08-04 MED ORDER — ISOSORBIDE DINITRATE 30 MG PO TABS: 30.0000 mg | ORAL_TABLET | Freq: Every day | ORAL | 2 refills | Status: DC

## 2019-08-10 ENCOUNTER — Other Ambulatory Visit: Payer: Self-pay | Admitting: *Deleted

## 2019-08-10 ENCOUNTER — Other Ambulatory Visit: Payer: Self-pay

## 2019-08-10 MED ORDER — FUROSEMIDE 20 MG PO TABS: 40.0000 mg | ORAL_TABLET | Freq: Every day | ORAL | 1 refills | Status: DC

## 2019-08-10 MED ORDER — POTASSIUM CHLORIDE CRYS ER 20 MEQ PO TBCR: 20.0000 meq | EXTENDED_RELEASE_TABLET | Freq: Every day | ORAL | 3 refills | Status: DC

## 2019-08-10 MED ORDER — ISOSORBIDE DINITRATE 30 MG PO TABS: 30.0000 mg | ORAL_TABLET | Freq: Every day | ORAL | 2 refills | Status: DC

## 2019-08-10 MED ORDER — LOSARTAN POTASSIUM 25 MG PO TABS: 25.0000 mg | ORAL_TABLET | Freq: Every day | ORAL | 1 refills | Status: DC

## 2019-08-10 MED ORDER — ISOSORBIDE DINITRATE 30 MG PO TABS: 30.0000 mg | ORAL_TABLET | Freq: Every day | ORAL | 1 refills | Status: DC

## 2019-08-10 MED ORDER — APIXABAN 2.5 MG PO TABS: 2.5000 mg | ORAL_TABLET | Freq: Two times a day (BID) | ORAL | 1 refills | Status: DC

## 2019-08-11 ENCOUNTER — Other Ambulatory Visit: Payer: Self-pay | Admitting: *Deleted

## 2019-08-11 MED ORDER — POTASSIUM CHLORIDE CRYS ER 20 MEQ PO TBCR: 20.0000 meq | EXTENDED_RELEASE_TABLET | Freq: Every day | ORAL | 3 refills | Status: DC

## 2019-08-25 ENCOUNTER — Other Ambulatory Visit: Payer: Self-pay | Admitting: Cardiology

## 2019-09-13 ENCOUNTER — Ambulatory Visit: Payer: Medicare Other | Admitting: Hematology

## 2019-09-13 ENCOUNTER — Other Ambulatory Visit: Payer: Medicare Other

## 2019-09-14 ENCOUNTER — Other Ambulatory Visit: Payer: Self-pay

## 2019-09-14 ENCOUNTER — Other Ambulatory Visit: Payer: Self-pay | Admitting: Hematology and Oncology

## 2019-09-14 ENCOUNTER — Inpatient Hospital Stay: Payer: Medicare Other | Attending: Hematology and Oncology

## 2019-09-14 ENCOUNTER — Inpatient Hospital Stay (HOSPITAL_BASED_OUTPATIENT_CLINIC_OR_DEPARTMENT_OTHER): Payer: Medicare Other | Admitting: Hematology and Oncology

## 2019-09-14 VITALS — BP 101/70 | HR 56 | Temp 97.5°F | Resp 18 | Ht 70.5 in | Wt 158.7 lb

## 2019-09-14 DIAGNOSIS — C8331 Diffuse large B-cell lymphoma, lymph nodes of head, face, and neck: Secondary | ICD-10-CM | POA: Diagnosis not present

## 2019-09-14 DIAGNOSIS — Z923 Personal history of irradiation: Secondary | ICD-10-CM | POA: Insufficient documentation

## 2019-09-14 DIAGNOSIS — C8510 Unspecified B-cell lymphoma, unspecified site: Secondary | ICD-10-CM | POA: Diagnosis present

## 2019-09-14 LAB — CBC WITH DIFFERENTIAL (CANCER CENTER ONLY)
Abs Immature Granulocytes: 0.01 10*3/uL (ref 0.00–0.07)
Basophils Absolute: 0 10*3/uL (ref 0.0–0.1)
Basophils Relative: 1 %
Eosinophils Absolute: 0.1 10*3/uL (ref 0.0–0.5)
Eosinophils Relative: 2 %
HCT: 35.4 % — ABNORMAL LOW (ref 39.0–52.0)
Hemoglobin: 11.9 g/dL — ABNORMAL LOW (ref 13.0–17.0)
Immature Granulocytes: 0 %
Lymphocytes Relative: 35 %
Lymphs Abs: 1.3 10*3/uL (ref 0.7–4.0)
MCH: 32 pg (ref 26.0–34.0)
MCHC: 33.6 g/dL (ref 30.0–36.0)
MCV: 95.2 fL (ref 80.0–100.0)
Monocytes Absolute: 0.4 10*3/uL (ref 0.1–1.0)
Monocytes Relative: 11 %
Neutro Abs: 2 10*3/uL (ref 1.7–7.7)
Neutrophils Relative %: 51 %
Platelet Count: 156 10*3/uL (ref 150–400)
RBC: 3.72 MIL/uL — ABNORMAL LOW (ref 4.22–5.81)
RDW: 13.1 % (ref 11.5–15.5)
WBC Count: 3.8 10*3/uL — ABNORMAL LOW (ref 4.0–10.5)
nRBC: 0 % (ref 0.0–0.2)

## 2019-09-14 LAB — CMP (CANCER CENTER ONLY)
ALT: 15 U/L (ref 0–44)
AST: 22 U/L (ref 15–41)
Albumin: 4.2 g/dL (ref 3.5–5.0)
Alkaline Phosphatase: 68 U/L (ref 38–126)
Anion gap: 11 (ref 5–15)
BUN: 28 mg/dL — ABNORMAL HIGH (ref 8–23)
CO2: 28 mmol/L (ref 22–32)
Calcium: 9 mg/dL (ref 8.9–10.3)
Chloride: 99 mmol/L (ref 98–111)
Creatinine: 1.6 mg/dL — ABNORMAL HIGH (ref 0.61–1.24)
GFR, Est AFR Am: 41 mL/min — ABNORMAL LOW (ref >60)
GFR, Estimated: 36 mL/min — ABNORMAL LOW (ref >60)
Glucose, Bld: 117 mg/dL — ABNORMAL HIGH (ref 70–99)
Potassium: 3.5 mmol/L (ref 3.5–5.1)
Sodium: 138 mmol/L (ref 135–145)
Total Bilirubin: 1.4 mg/dL — ABNORMAL HIGH (ref 0.3–1.2)
Total Protein: 7.5 g/dL (ref 6.5–8.1)

## 2019-09-14 LAB — SEDIMENTATION RATE: Sed Rate: 24 mm/hr — ABNORMAL HIGH (ref 0–16)

## 2019-09-14 NOTE — Progress Notes (Signed)
[facility] [phone]   [phone]  PROGRESS NOTE  Patient Care Team: [name], MD as PCP - General (Family Medicine) Stanford Breed Denice Bors, MD as PCP - Cardiology (Cardiology) [name], MD as Consulting Physician (Hematology) Rozetta Nunnery, MD as Consulting Physician (Otolaryngology) Eppie Gibson, MD as Attending Physician (Radiation Oncology) Karie Mainland, RD as Dietitian (Nutrition)  Hematological/Oncological History 1. 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 Large R maxillary sinus mass (~5.6cm) with multifocal bony erosion, including R masticator space, pterygopalatine fossa, and inferior orbit; no cervical lymphadenopathy  ? Atypical lymphocytes suspicious, but not diagnostic, for lymphoma on FNA  ? R maxillary core bx; path showed high-grade B-cell lymphoma, EBV FISH neg, Ki-67 ~90%; FISH positive for trisomy 56, negative   09/14/2019: transfer care to Dr. Lorenso Courier   Interval History:  The patient's last visit was onFrank G Cristi Mendoza 86 y.o. 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  02/29/2020 with Dr. Maylon Peppers. In the interim since the last visit he had a PET CT on 07/04/2019 showing clinical remission and visit with Dr. Isidore Moos.  On exam today [name] is quite talkative.  He has unfortunately lost approximately 7 pounds since he was last seen in March 2021.  He notes that he has not been having any issues with fevers, chills, sweats, nausea, vomiting or diarrhea.  He denies any pain at the site of his sinus and has had no new symptoms since he last had the PET CT scan performed.  Today he is accompanied by his son who helps provide some of the history.  The patient notes that he does "have issues with legs tiring out" and that he is only able to walk approximately 50-100 feet before he has to  sit down.  A full 10 point ROS is listed below.  MEDICAL HISTORY:  Past Medical History:  Diagnosis Date  . Atrial fibrillation ([place])   . BPH (benign prostatic hyperplasia)   . Dyspnea on exertion    a. 09/2008 NL EF on myoview.  [name] Hearing difficulty    R sided hole in eardrum, only wears L hearing aid  . History of tobacco abuse   . Hypertension   . Lumbar stenosis with neurogenic claudication    a. 02/2011 s/p decompressive laminectomy.  . Midsternal chest pain    a. 09/2008 Myoview: EF 56%, mild fixed thinning of inf wall particularly @ base -? attenuation.  No ischemia  . PUD (peptic ulcer disease)    Remote, history of  . Urinary hesitancy     SURGICAL HISTORY: Past Surgical History:  Procedure Laterality Date  . HERNIA REPAIR     Left  . LUMBAR LAMINECTOMY/DECOMPRESSION MICRODISCECTOMY  03/04/2011   Procedure: LUMBAR LAMINECTOMY/DECOMPRESSION MICRODISCECTOMY;  Surgeon: Ophelia Charter;  Location: [facility];  Service: Neurosurgery;  Laterality: N/A;  LUMBAR THREE-FOUR ,FOUR-FIVE LAMINECTOMIES  . TYMPANOMASTOIDECTOMY     Left modified radical with type 3 tympanoplasty    SOCIAL HISTORY: Social History   Socioeconomic History  . Marital status: Widowed    Spouse name: Not on file  . Number of children: 2  . Years of education: Not on file  . Highest education level: Not on file  Occupational History  . Occupation: [employer]    Comment: Retired  Tobacco Use  . Smoking status: Former Research scientist (life sciences)  .  Smokeless tobacco: Never Used  . Tobacco comment: Remote history of smoking  Substance and Sexual Activity  . Alcohol use: No  . Drug use: No  . Sexual activity: Not on file  Other Topics Concern  . Not on file  Social History Narrative   Lives with his wife in [place].   He swims daily.   He has 2 grown daughters.   1 Caffeine drink daily    Social Determinants of Health   Financial Resource Strain:   . Difficulty of Paying Living Expenses:     Food Insecurity:   . Worried About Charity fundraiser in the Last Year:   . Arboriculturist in the Last Year:   Transportation Needs: No Transportation Needs  . Lack of Transportation (Medical): No  . Lack of Transportation (Non-Medical): No  Physical Activity:   . Days of Exercise per Week:   . Minutes of Exercise per Session:   Stress:   . Feeling of Stress :   Social Connections:   . Frequency of Communication with Friends and Family:   . Frequency of Social Gatherings with Friends and Family:   . Attends Religious Services:   . Active Member of Clubs or Organizations:   . Attends Archivist Meetings:   [name] Marital Status:   Intimate Partner Violence: Not At Risk  . Fear of Current or Ex-Partner: No  . Emotionally Abused: No  . Physically Abused: No  . Sexually Abused: No    FAMILY HISTORY: Family History  Problem Relation Age of Onset  . Lung cancer Mother   . Appendicitis Father   . Diabetes Sister        DM  . Coronary artery disease Neg Hx        No premature CAD  . Colon cancer Neg Hx     ALLERGIES:  has No Known Allergies.  MEDICATIONS:  Current Outpatient Medications  Medication Sig Dispense Refill  . acetaminophen (TYLENOL) 500 MG tablet Take 1,000 mg by mouth every 6 (six) hours as needed for mild pain or moderate pain.    [name] apixaban (ELIQUIS) 2.5 MG TABS tablet Take 1 tablet (2.5 mg total) by mouth 2 (two) times daily. 180 tablet 1  . Dextromethorphan-Guaifenesin (DELSYM COUGH/CHEST CONGEST DM PO) Take 10 mLs by mouth daily as needed (for cough).     . furosemide (LASIX) 20 MG tablet Take 2 tablets (40 mg total) by mouth daily. 180 tablet 1  . isosorbide dinitrate (ISORDIL) 30 MG tablet Take 1 tablet (30 mg total) by mouth daily. 90 tablet 1  . losartan (COZAAR) 25 MG tablet TAKE 1 TABLET BY MOUTH EVERY DAY 10 tablet 5  . Multiple Vitamins-Minerals (PRESERVISION AREDS 2+MULTI VIT) CAPS Take 1 capsule by mouth daily.    [name] oxyCODONE (OXY  IR/ROXICODONE) 5 MG immediate release tablet Take 5 mg by mouth 4 (four) times daily as needed.    . potassium chloride SA (KLOR-CON) 20 MEQ tablet Take 1 tablet (20 mEq total) by mouth daily. 90 tablet 3   No current facility-administered medications for this visit.    REVIEW OF SYSTEMS:   Constitutional: ( - ) fevers, ( - )  chills , ( - ) night sweats Eyes: ( - ) blurriness of vision, ( - ) double vision, ( - ) watery eyes Ears, nose, mouth, throat, and face: ( - ) mucositis, ( - ) sore throat Respiratory: ( - ) cough, ( - ) dyspnea, ( - )  wheezes Cardiovascular: ( - ) palpitation, ( - ) chest discomfort, ( - ) lower extremity swelling Gastrointestinal:  ( - ) nausea, ( - ) heartburn, ( - ) change in bowel habits Skin: ( - ) abnormal skin rashes Lymphatics: ( - ) new lymphadenopathy, ( - ) easy bruising Neurological: ( - ) numbness, ( - ) tingling, ( - ) new weaknesses Behavioral/Psych: ( - ) mood change, ( - ) new changes  All other systems were reviewed with the patient and are negative.  PHYSICAL EXAMINATION: ECOG PERFORMANCE STATUS: 3 - Symptomatic, >50% confined to bed  Vitals:   09/14/19 1513  BP: 101/70  Pulse: (!) 56  Resp: 18  Temp: (!) 97.5 F (36.4 C)  SpO2: 99%   Filed Weights   09/14/19 1513  Weight: 158 lb 11.2 oz (72 kg)    GENERAL: well appearing elderly Caucasian male. alert, no distress and comfortable SKIN: skin color, texture, turgor are normal, no rashes or significant lesions EYES: conjunctiva are pink and non-injected, sclera clear NECK: supple, non-tender LYMPH:  no palpable lymphadenopathy in the cervical, axillary or inguinal LUNGS: clear to auscultation and percussion with normal breathing effort HEART: regular rate & rhythm and no murmurs and no lower extremity edema Musculoskeletal: no cyanosis of digits and no clubbing  PSYCH: alert & oriented x 3, fluent speech NEURO: no focal motor/sensory deficits  LABORATORY DATA:  I have reviewed  the data as listed CBC Latest Ref Rng & Units 09/14/2019 07/13/2019 03/01/2019  WBC 4.0 - 10.5 K/uL 3.8(L) 4.3 4.2  Hemoglobin 13.0 - 17.0 g/dL 11.9(L) 11.7(L) 12.1(L)  Hematocrit 39.0 - 52.0 % 35.4(L) 33.7(L) 35.9(L)  Platelets 150 - 400 K/uL 156 146(L) 146(L)    CMP Latest Ref Rng & Units 07/13/2019 03/01/2019 12/21/2018  Glucose 65 - 99 mg/dL 136(H) 101(H) 86  BUN 10 - 36 mg/dL 24 24(H) 20  Creatinine 0.76 - 1.27 mg/dL 1.46(H) 1.49(H) 1.55(H)  Sodium 134 - 144 mmol/L 138 138 135  Potassium 3.5 - 5.2 mmol/L 3.7 3.7 4.1  Chloride 96 - 106 mmol/L 99 100 92(L)  CO2 20 - 29 mmol/L _0 Calcium 8.6 - 10.2 mg/dL 9.1 9.0 9.0  Total Protein 6.5 - 8.1 g/dL - 7.7 -  Total Bilirubin 0.3 - 1.2 mg/dL - 2.3(H) -  Alkaline Phos 38 - 126 U/L - 104 -  AST 15 - 41 U/L - 21 -  ALT 0 - 44 U/L - 13 -   RADIOGRAPHIC STUDIES: I have personally reviewed the radiological images as listed and agreed with the findings in the report.  CLINICAL DATA:  Subsequent treatment strategy for large B-cell lymphoma of the right maxillofacial region. The patient received radiation therapy from 03/20/2019 to 03/29/2019.  EXAM: NUCLEAR MEDICINE PET SKULL BASE TO THIGH  TECHNIQUE: 7.5 mCi F-18 FDG was injected intravenously. Full-ring PET imaging was performed from the skull base to thigh after the radiotracer. CT data was obtained and used for attenuation correction and anatomic localization.  Fasting blood glucose: 92 mg/dl  COMPARISON:  Multiple exams, including 03/08/2019  FINDINGS: Mediastinal blood pool activity: SUV max 2.3  Liver activity: SUV max 3.9  NECK: Demineralization of the anterior wall the right maxillary sinus with mild soft tissue prominence in this vicinity with a very soft tissue thickening up to 0.8 cm (formerly 1.1 cm) with maximum SUV in this region 3.0 (formerly 2.8). This is currently Deauville 2 activity and was previously Deauville 2 activity.  There is  a small  amount of regional dental activity. No hypermetabolic adenopathy in the neck.  Incidental CT findings: Bilateral common carotid atherosclerotic calcification. Left mastoidectomy.  CHEST: No significant abnormal hypermetabolic activity in this region.  Incidental CT findings: Coronary, aortic arch, and branch vessel atherosclerotic vascular disease. Mild cardiomegaly. Trace bilateral pleural effusions. Faint scarring posteriorly in the right upper lobe.  ABDOMEN/PELVIS: Physiologic activity in bowel. No significant abnormal hypermetabolic activity in this region.  Incidental CT findings: Aortoiliac atherosclerotic vascular disease. No splenomegaly. Sigmoid colon diverticulosis.  SKELETON: Accentuated activity along the spine extensive spurring at the L4-5 level, probably inflammatory, more notable on the right side where maximum SUV is currently 5.5. Accentuated activity along joint capsule of the glenohumeral joints and hip joints probably from arthropathy.  Incidental CT findings: Bridging spurring of the sacroiliac joints. Dextroconvex lumbar scoliosis. Multilevel lumbar spondylosis and degenerative disc disease. Postoperative findings at L3-4 and L4-5 with posterior decompression at L4.  IMPRESSION: 1. Demineralization of the anterior wall the right maxillary sinus with mild soft tissue thickening in this vicinity (currently 0.8 cm, formerly 1.1 cm) persistent over L2 activity in this region. 2. No adenopathy or other regions of suspicion for malignancy. 3. Other imaging findings of potential clinical significance: Aortic Atherosclerosis (ICD10-I70.0). Coronary atherosclerosis. Mild cardiomegaly. Trace bilateral pleural effusions. Sigmoid colon diverticulosis. Lumbar spondylosis and degenerative disc disease with some likely inflammatory activity along the prominent marginal spurring at the L4-5 level.   Electronically Signed   By: Van Clines M.D.    On: 07/04/2019 14:36  No results found.  ASSESSMENT & PLAN Ralph Mendoza 86 y.o. 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  After review the labs, review the imaging and discussion with the patient his findings are most consistent with high-grade B-cell lymphoma in remission following palliative radiation therapy.  At this time I would not recommend routine imaging as we would not recommend chemotherapy in the event he were to have relapsed disease.  I think we continue to follow clinically and in the event the patient were to have any symptoms we could consider reimaging at that time.  We will plan to see Mr. Hassan back in approximately 6 months time to reevaluate or recurrence.   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  --will recommend f/u in 6 months time to re-evaluate. Can be seen sooner in the interim if new symptoms or laboratory abnormalities were to develop.   No orders of the defined types were placed in this encounter.   All questions were answered. The patient knows to call the clinic with any problems, questions or concerns.  A total of more than 40 minutes were spent on this encounter and over half of that time was spent on counseling and coordination of care as outlined above.   Ledell Peoples, MD Department of Hematology/Oncology [place] at [facility] Phone: [phone] Pager: [phone] Email: [email or web address]  09/14/2019 3:22 PM

## 2019-09-15 ENCOUNTER — Telehealth: Payer: Self-pay | Admitting: General Practice

## 2019-09-15 ENCOUNTER — Encounter: Payer: Self-pay | Admitting: General Practice

## 2019-09-15 LAB — LACTATE DEHYDROGENASE: LDH: 131 U/L (ref 98–192)

## 2019-09-15 NOTE — Telephone Encounter (Signed)
[facility] CSW Progress Notes  Call to Blenda Nicely, son in law, to discuss options for additional in home care for patient who lives in independent living situation at [place].  Says father in law has ignored calls from [employer] - "we have gotten increasingly concerned about him."  Would like "someone to take a look at him and see what he really needs."  Family has considered encouraging him to seek care in assisted living center - patient wants to remain where he is.  Family states he has difficulty with seeing and hearing - "we need someone to help [place] make a plan to help him age well."  Referred to [facility] for community resource navigation via [place] and also advised family to contact PCP office in order to get more assistance in determining next best steps for patient.  Edwyna Shell, LCSW Clinical Social Worker Phone:  [phone] Cell:  [phone]

## 2019-09-16 ENCOUNTER — Encounter: Payer: Self-pay | Admitting: Hematology and Oncology

## 2019-11-26 ENCOUNTER — Other Ambulatory Visit: Payer: Self-pay | Admitting: Cardiology

## 2019-12-15 ENCOUNTER — Other Ambulatory Visit: Payer: Self-pay | Admitting: Cardiology

## 2020-02-27 NOTE — Progress Notes (Signed)
HPI: FU atrial fibrillation. We have elected rate control and anticoagulation for afib. Holter monitor in February of 2014 showed sinus rhythm, mobitz 1; toprol DCed. Nuclear study September 2017 showed ejection fraction 28%. No ischemia. Fixed apical defect. Patient had CT scan January 2018 that showed large right and small left pleural effusion. Patient had thoracentesis January 18.Cytology negative.Patient had repeat thoracentesis June 2018.Fluidnot sent for analysis.Echocardiogram February 2019 described as diffuse hypokinesis with ejection fraction similar to study in August 2017 which was 25 to 30%. There was biatrial enlargement and moderate tricuspid regurgitation. 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  Since last seen,patient is losing weight.  He denies dyspnea, chest pain, palpitations or syncope.  No falls or bleeding.  He is losing his vision and hearing.  Current Outpatient Medications  Medication Sig Dispense Refill  . acetaminophen (TYLENOL) 500 MG tablet Take 1,000 mg by mouth every 6 (six) hours as needed for mild pain or moderate pain.    [name] Dextromethorphan-Guaifenesin (DELSYM COUGH/CHEST CONGEST DM PO) Take 10 mLs by mouth daily as needed (for cough).     [name] ELIQUIS 2.5 MG TABS tablet TAKE 1 TABLET BY MOUTH  TWICE DAILY 180 tablet 3  . furosemide (LASIX) 20 MG tablet TAKE 2 TABLETS BY MOUTH  DAILY 180 tablet 2  . isosorbide dinitrate (ISORDIL) 30 MG tablet Take 1 tablet (30 mg total) by mouth daily. 90 tablet 1  . losartan (COZAAR) 25 MG tablet TAKE 1 TABLET BY MOUTH  DAILY 90 tablet 3  . Multiple Vitamins-Minerals (PRESERVISION AREDS 2+MULTI VIT) CAPS Take 1 capsule by mouth daily.    [name] oxyCODONE (OXY IR/ROXICODONE) 5 MG immediate release tablet Take 5 mg by mouth 4 (four) times daily as needed.    . potassium chloride SA (KLOR-CON) 20 MEQ tablet Take 1 tablet (20 mEq total) by mouth daily. 90 tablet 3   No current  facility-administered medications for this visit.     Past Medical History:  Diagnosis Date  . Atrial fibrillation ([place])   . BPH (benign prostatic hyperplasia)   . Dyspnea on exertion    a. 09/2008 NL EF on myoview.  [name] Hearing difficulty    R sided hole in eardrum, only wears L hearing aid  . History of tobacco abuse   . Hypertension   . Lumbar stenosis with neurogenic claudication    a. 02/2011 s/p decompressive laminectomy.  . Midsternal chest pain    a. 09/2008 Myoview: EF 56%, mild fixed thinning of inf wall particularly @ base -? attenuation.  No ischemia  . PUD (peptic ulcer disease)    Remote, history of  . Urinary hesitancy     Past Surgical History:  Procedure Laterality Date  . HERNIA REPAIR     Left  . LUMBAR LAMINECTOMY/DECOMPRESSION MICRODISCECTOMY  03/04/2011   Procedure: LUMBAR LAMINECTOMY/DECOMPRESSION MICRODISCECTOMY;  Surgeon: Ophelia Charter;  Location: [facility];  Service: Neurosurgery;  Laterality: N/A;  LUMBAR THREE-FOUR ,FOUR-FIVE LAMINECTOMIES  . TYMPANOMASTOIDECTOMY     Left modified radical with type 3 tympanoplasty    Social History   Socioeconomic History  . Marital status: Widowed    Spouse name: Not on file  . Number of children: 2  . Years of education: Not on file  . Highest education level: Not on file  Occupational History  . Occupation: [employer]    Comment: Retired  Tobacco Use  . Smoking status: Former Research scientist (life sciences)  . Smokeless  tobacco: Never Used  . Tobacco comment: Remote history of smoking  Vaping Use  . Vaping Use: Never used  Substance and Sexual Activity  . Alcohol use: No  . Drug use: No  . Sexual activity: Not on file  Other Topics Concern  . Not on file  Social History Narrative   Lives with his wife in [place].   He swims daily.   He has 2 grown daughters.   1 Caffeine drink daily    Social Determinants of Health   Financial Resource Strain:   . Difficulty of Paying Living Expenses: Not on file    Food Insecurity:   . Worried About Charity fundraiser in the Last Year: Not on file  . Ran Out of Food in the Last Year: Not on file  Transportation Needs: No Transportation Needs  . Lack of Transportation (Medical): No  . Lack of Transportation (Non-Medical): No  Physical Activity:   . Days of Exercise per Week: Not on file  . Minutes of Exercise per Session: Not on file  Stress:   . Feeling of Stress : Not on file  Social Connections:   . Frequency of Communication with Friends and Family: Not on file  . Frequency of Social Gatherings with Friends and Family: Not on file  . Attends Religious Services: Not on file  . Active Member of Clubs or Organizations: Not on file  . Attends Archivist Meetings: Not on file  . Marital Status: Not on file  Intimate Partner Violence: Not At Risk  . Fear of Current or Ex-Partner: No  . Emotionally Abused: No  . Physically Abused: No  . Sexually Abused: No    Family History  Problem Relation Age of Onset  . Lung cancer Mother   . Appendicitis Father   . Diabetes Sister        DM  . Coronary artery disease Neg Hx        No premature CAD  . Colon cancer Neg Hx     ROS: no fevers or chills, productive cough, hemoptysis, dysphasia, odynophagia, melena, hematochezia, dysuria, hematuria, rash, seizure activity, orthopnea, PND, pedal edema, claudication. Remaining systems are negative.  Physical Exam: Well-developed frail in no acute distress.  Skin is warm and dry.  HEENT is normal.  Neck is supple.  Chest is clear to auscultation with normal expansion.  Cardiovascular exam is irregular Abdominal exam nontender or distended. No masses palpated. Extremities show no edema. neuro grossly intact  ECG-atrial fibrillation with PVCs or aberrantly conducted beats, right axis deviation, no significant ST changes.  Personally reviewed  A/P  1 chronic systolic congestive heart failure-patient's volume status reasonable today on  examination.  Continue Lasix.  Check potassium and renal function.  Continue fluid restriction and low-sodium diet.  2 permanent atrial fibrillation-heart rate is controlled on no medications.  Continue apixaban.  Check hemoglobin and renal function.  3 nonischemic cardiomyopathy-given patient's age plan is to be conservative.  Continue ARB.  No beta-blocker given history of bradycardia.  4 hypertension-patient's blood pressure is controlled.  5 history of right pleural effusion-continue diuretics at present dose.  6 B-cell lymphoma maxillary sinus-managed by oncology.  Kirk Ruths, MD

## 2020-03-04 ENCOUNTER — Ambulatory Visit: Payer: Medicare Other | Admitting: Cardiology

## 2020-03-04 ENCOUNTER — Other Ambulatory Visit: Payer: Self-pay

## 2020-03-04 ENCOUNTER — Encounter: Payer: Self-pay | Admitting: Cardiology

## 2020-03-04 VITALS — BP 140/70 | HR 72 | Ht 70.5 in | Wt 160.0 lb

## 2020-03-04 DIAGNOSIS — I428 Other cardiomyopathies: Secondary | ICD-10-CM

## 2020-03-04 DIAGNOSIS — I4821 Permanent atrial fibrillation: Secondary | ICD-10-CM

## 2020-03-04 DIAGNOSIS — I5022 Chronic systolic (congestive) heart failure: Secondary | ICD-10-CM

## 2020-03-04 DIAGNOSIS — I10 Essential (primary) hypertension: Secondary | ICD-10-CM

## 2020-03-04 NOTE — Patient Instructions (Signed)

## 2020-03-05 LAB — CBC
Hematocrit: 37.4 % — ABNORMAL LOW (ref 37.5–51.0)
Hemoglobin: 12.3 g/dL — ABNORMAL LOW (ref 13.0–17.7)
MCH: 31.4 pg (ref 26.6–33.0)
MCHC: 32.9 g/dL (ref 31.5–35.7)
MCV: 95 fL (ref 79–97)
Platelets: 180 10*3/uL (ref 150–450)
RBC: 3.92 x10E6/uL — ABNORMAL LOW (ref 4.14–5.80)
RDW: 13 % (ref 11.6–15.4)
WBC: 4.8 10*3/uL (ref 3.4–10.8)

## 2020-03-05 LAB — BASIC METABOLIC PANEL
BUN/Creatinine Ratio: 21 (ref 10–24)
BUN: 31 mg/dL (ref 10–36)
CO2: 25 mmol/L (ref 20–29)
Calcium: 9 mg/dL (ref 8.6–10.2)
Chloride: 99 mmol/L (ref 96–106)
Creatinine, Ser: 1.49 mg/dL — ABNORMAL HIGH (ref 0.76–1.27)
GFR calc Af Amer: 45 mL/min/{1.73_m2} — ABNORMAL LOW (ref >59)
GFR calc non Af Amer: 39 mL/min/{1.73_m2} — ABNORMAL LOW (ref >59)
Glucose: 100 mg/dL — ABNORMAL HIGH (ref 65–99)
Potassium: 4.4 mmol/L (ref 3.5–5.2)
Sodium: 138 mmol/L (ref 134–144)

## 2020-03-07 ENCOUNTER — Encounter: Payer: Self-pay | Admitting: *Deleted

## 2020-03-11 ENCOUNTER — Telehealth: Payer: Self-pay | Admitting: Hematology and Oncology

## 2020-03-11 NOTE — Telephone Encounter (Signed)
Rescheduled per 11/22 scheduling message - spoke with steve and he understands.

## 2020-03-15 ENCOUNTER — Inpatient Hospital Stay: Payer: Medicare Other | Admitting: Hematology and Oncology

## 2020-03-15 ENCOUNTER — Inpatient Hospital Stay: Payer: Medicare Other

## 2020-03-22 ENCOUNTER — Inpatient Hospital Stay: Payer: Medicare Other | Attending: Hematology and Oncology

## 2020-03-22 ENCOUNTER — Inpatient Hospital Stay: Payer: Medicare Other | Admitting: Hematology and Oncology

## 2020-03-22 ENCOUNTER — Other Ambulatory Visit: Payer: Self-pay

## 2020-03-22 ENCOUNTER — Other Ambulatory Visit: Payer: Self-pay | Admitting: Hematology and Oncology

## 2020-03-22 ENCOUNTER — Encounter: Payer: Self-pay | Admitting: Hematology and Oncology

## 2020-03-22 VITALS — BP 136/72 | HR 63 | Temp 98.5°F | Resp 18 | Ht 70.5 in | Wt 164.6 lb

## 2020-03-22 DIAGNOSIS — Z8572 Personal history of non-Hodgkin lymphomas: Secondary | ICD-10-CM | POA: Insufficient documentation

## 2020-03-22 DIAGNOSIS — D696 Thrombocytopenia, unspecified: Secondary | ICD-10-CM | POA: Diagnosis not present

## 2020-03-22 DIAGNOSIS — D649 Anemia, unspecified: Secondary | ICD-10-CM | POA: Diagnosis not present

## 2020-03-22 DIAGNOSIS — Z923 Personal history of irradiation: Secondary | ICD-10-CM | POA: Insufficient documentation

## 2020-03-22 DIAGNOSIS — C8331 Diffuse large B-cell lymphoma, lymph nodes of head, face, and neck: Secondary | ICD-10-CM

## 2020-03-22 LAB — CMP (CANCER CENTER ONLY)
ALT: 9 U/L (ref 0–44)
AST: 17 U/L (ref 15–41)
Albumin: 3.6 g/dL (ref 3.5–5.0)
Alkaline Phosphatase: 79 U/L (ref 38–126)
Anion gap: 11 (ref 5–15)
BUN: 33 mg/dL — ABNORMAL HIGH (ref 8–23)
CO2: 25 mmol/L (ref 22–32)
Calcium: 9.1 mg/dL (ref 8.9–10.3)
Chloride: 102 mmol/L (ref 98–111)
Creatinine: 1.67 mg/dL — ABNORMAL HIGH (ref 0.61–1.24)
GFR, Estimated: 37 mL/min — ABNORMAL LOW (ref >60)
Glucose, Bld: 134 mg/dL — ABNORMAL HIGH (ref 70–99)
Potassium: 4.3 mmol/L (ref 3.5–5.1)
Sodium: 138 mmol/L (ref 135–145)
Total Bilirubin: 1.4 mg/dL — ABNORMAL HIGH (ref 0.3–1.2)
Total Protein: 7.3 g/dL (ref 6.5–8.1)

## 2020-03-22 LAB — CBC WITH DIFFERENTIAL (CANCER CENTER ONLY)
Abs Immature Granulocytes: 0.01 10*3/uL (ref 0.00–0.07)
Basophils Absolute: 0.1 10*3/uL (ref 0.0–0.1)
Basophils Relative: 2 %
Eosinophils Absolute: 0.1 10*3/uL (ref 0.0–0.5)
Eosinophils Relative: 2 %
HCT: 33.5 % — ABNORMAL LOW (ref 39.0–52.0)
Hemoglobin: 10.8 g/dL — ABNORMAL LOW (ref 13.0–17.0)
Immature Granulocytes: 0 %
Lymphocytes Relative: 27 %
Lymphs Abs: 1.1 10*3/uL (ref 0.7–4.0)
MCH: 31.5 pg (ref 26.0–34.0)
MCHC: 32.2 g/dL (ref 30.0–36.0)
MCV: 97.7 fL (ref 80.0–100.0)
Monocytes Absolute: 0.5 10*3/uL (ref 0.1–1.0)
Monocytes Relative: 12 %
Neutro Abs: 2.4 10*3/uL (ref 1.7–7.7)
Neutrophils Relative %: 57 %
Platelet Count: 157 10*3/uL (ref 150–400)
RBC: 3.43 MIL/uL — ABNORMAL LOW (ref 4.22–5.81)
RDW: 14.2 % (ref 11.5–15.5)
WBC Count: 4.2 10*3/uL (ref 4.0–10.5)
nRBC: 0 % (ref 0.0–0.2)

## 2020-03-22 LAB — LACTATE DEHYDROGENASE: LDH: 150 U/L (ref 98–192)

## 2020-03-22 NOTE — Progress Notes (Signed)
[place] [phone]   [phone]  PROGRESS NOTE  Patient Care Team: [name], MD as PCP - General (Family Medicine) Stanford Breed Denice Bors, MD as PCP - Cardiology (Cardiology) [name], MD (Inactive) as Consulting Physician (Hematology) Rozetta Nunnery, MD as Consulting Physician (Otolaryngology) Eppie Gibson, MD as Attending Physician (Radiation Oncology) Karie Mainland, RD as Dietitian (Nutrition)  Hematological/Oncological History 1. 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 Large R maxillary sinus mass (~5.6cm) with multifocal bony erosion, including R masticator space, pterygopalatine fossa, and inferior orbit; no cervical lymphadenopathy  ? Atypical lymphocytes suspicious, but not diagnostic, for lymphoma on FNA  ? R maxillary core bx; path showed high-grade B-cell lymphoma, EBV FISH neg, Ki-67 ~90%; FISH positive for trisomy 75, negative   09/14/2019: transfer care to Dr. Lorenso Courier   Interval History:  Ralph Mendoza 86 y.o. 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 His last visit was on 09/14/2019 at which time he established care. In the interim since the last visit he has had no ED visits or hospitalizations.   On exam today Ralph Mendoza is accompanied by his son-in-law.  He unfortunately forgot his hearing aids today and is very hard of hearing.  His son-in-law answers most of his questions for him.  He reports that he is doing "the same".  He has had no hospitalizations or ER visits since our last talk.  He notes that his ejection fraction on his heart is now up to 31-1/2% and he is very happy about this as he was previously told it was down to 25%.  He notes that he does continue to have fatigue and sleeps a lot, but no more than usual.  He otherwise denies having any fevers, chills, sweats,  nausea, or diarrhea.  He denies any signs or symptoms of recurrent disease with no lymphadenopathy or swelling at the site of his prior cancer.  His weight is increased 6 pound since her last visit.  A full 10 point ROS is listed below.  MEDICAL HISTORY:  Past Medical History:  Diagnosis Date   Atrial fibrillation (HCC)    BPH (benign prostatic hyperplasia)    Dyspnea on exertion    a. 09/2008 NL EF on myoview.   Hearing difficulty    R sided hole in eardrum, only wears L hearing aid   History of tobacco abuse    Hypertension    Lumbar stenosis with neurogenic claudication    a. 02/2011 s/p decompressive laminectomy.   Midsternal chest pain    a. 09/2008 Myoview: EF 56%, mild fixed thinning of inf wall particularly @ base -? attenuation.  No ischemia   PUD (peptic ulcer disease)    Remote, history of   Urinary hesitancy     SURGICAL HISTORY: Past Surgical History:  Procedure Laterality Date   HERNIA REPAIR     Left   LUMBAR LAMINECTOMY/DECOMPRESSION MICRODISCECTOMY  03/04/2011   Procedure: LUMBAR LAMINECTOMY/DECOMPRESSION MICRODISCECTOMY;  Surgeon: Ophelia Charter;  Location: [facility];  Service: Neurosurgery;  Laterality: N/A;  LUMBAR THREE-FOUR ,FOUR-FIVE LAMINECTOMIES   TYMPANOMASTOIDECTOMY     Left modified radical with type 3 tympanoplasty    SOCIAL HISTORY: Social History   Socioeconomic History   Marital status: Widowed    Spouse name: Not on file   Number of children: 2   Years of education: Not  on file   Highest education level: Not on file  Occupational History   Occupation: [employer]    Comment: Retired  Tobacco Use   Smoking status: Former Smoker   Smokeless tobacco: Never Used   Tobacco comment: Remote history of smoking  Vaping Use   Vaping Use: Never used  Substance and Sexual Activity   Alcohol use: No   Drug use: No   Sexual activity: Not on file  Other Topics Concern   Not on file  Social History  Narrative   Lives with his wife in [place].   He swims daily.   He has 2 grown daughters.   1 Caffeine drink daily    Social Determinants of Health   Financial Resource Strain:    Difficulty of Paying Living Expenses: Not on file  Food Insecurity:    Worried About [place] in the Last Year: Not on file   Ran Out of Food in the Last Year: Not on file  Transportation Needs:    Lack of Transportation (Medical): Not on file   Lack of Transportation (Non-Medical): Not on file  Physical Activity:    Days of Exercise per Week: Not on file   Minutes of Exercise per Session: Not on file  Stress:    Feeling of Stress : Not on file  Social Connections:    Frequency of Communication with Friends and Family: Not on file   Frequency of Social Gatherings with Friends and Family: Not on file   Attends Religious Services: Not on file   Active Member of Clubs or Organizations: Not on file   Attends Archivist Meetings: Not on file   Marital Status: Not on file  Intimate Partner Violence:    Fear of Current or Ex-Partner: Not on file   Emotionally Abused: Not on file   Physically Abused: Not on file   Sexually Abused: Not on file    FAMILY HISTORY: Family History  Problem Relation Age of Onset   Lung cancer Mother    Appendicitis Father    Diabetes Sister        DM   Coronary artery disease Neg Hx        No premature CAD   Colon cancer Neg Hx     ALLERGIES:  has No Known Allergies.  MEDICATIONS:  Current Outpatient Medications  Medication Sig Dispense Refill   acetaminophen (TYLENOL) 500 MG tablet Take 1,000 mg by mouth every 6 (six) hours as needed for mild pain or moderate pain.     Dextromethorphan-Guaifenesin (DELSYM COUGH/CHEST CONGEST DM PO) Take 10 mLs by mouth daily as needed (for cough).      ELIQUIS 2.5 MG TABS tablet TAKE 1 TABLET BY MOUTH  TWICE DAILY 180 tablet 3   furosemide (LASIX) 20 MG tablet TAKE 2 TABLETS BY  MOUTH  DAILY 180 tablet 2   isosorbide dinitrate (ISORDIL) 30 MG tablet Take 1 tablet (30 mg total) by mouth daily. 90 tablet 1   losartan (COZAAR) 25 MG tablet TAKE 1 TABLET BY MOUTH  DAILY 90 tablet 3   Multiple Vitamins-Minerals (PRESERVISION AREDS 2+MULTI VIT) CAPS Take 1 capsule by mouth daily.     oxyCODONE (OXY IR/ROXICODONE) 5 MG immediate release tablet Take 5 mg by mouth 4 (four) times daily as needed.     potassium chloride SA (KLOR-CON) 20 MEQ tablet Take 1 tablet (20 mEq total) by mouth daily. 90 tablet 3   No current facility-administered medications for  this visit.    REVIEW OF SYSTEMS:   Constitutional: ( - ) fevers, ( - )  chills , ( - ) night sweats Eyes: ( - ) blurriness of vision, ( - ) double vision, ( - ) watery eyes Ears, nose, mouth, throat, and face: ( - ) mucositis, ( - ) sore throat Respiratory: ( - ) cough, ( - ) dyspnea, ( - ) wheezes Cardiovascular: ( - ) palpitation, ( - ) chest discomfort, ( - ) lower extremity swelling Gastrointestinal:  ( - ) nausea, ( - ) heartburn, ( - ) change in bowel habits Skin: ( - ) abnormal skin rashes Lymphatics: ( - ) new lymphadenopathy, ( - ) easy bruising Neurological: ( - ) numbness, ( - ) tingling, ( - ) new weaknesses Behavioral/Psych: ( - ) mood change, ( - ) new changes  All other systems were reviewed with the patient and are negative.  PHYSICAL EXAMINATION: ECOG PERFORMANCE STATUS: 3 - Symptomatic, >50% confined to bed  Vitals:   03/22/20 1542  BP: 136/72  Pulse: 63  Resp: 18  Temp: 98.5 F (36.9 C)  SpO2: 100%   Filed Weights   03/22/20 1542  Weight: 164 lb 9.6 oz (74.7 kg)    GENERAL: well appearing elderly Caucasian male. alert, no distress and comfortable SKIN: skin color, texture, turgor are normal, no rashes or significant lesions EYES: conjunctiva are pink and non-injected, sclera clear NECK: supple, non-tender LYMPH:  no palpable lymphadenopathy in the cervical, axillary or  inguinal LUNGS: clear to auscultation and percussion with normal breathing effort HEART: regular rate & rhythm and no murmurs and no lower extremity edema Musculoskeletal: no cyanosis of digits and no clubbing  PSYCH: alert & oriented x 3, fluent speech NEURO: no focal motor/sensory deficits  LABORATORY DATA:  I have reviewed the data as listed CBC Latest Ref Rng & Units 03/22/2020 03/04/2020 09/14/2019  WBC 4.0 - 10.5 K/uL 4.2 4.8 3.8(L)  Hemoglobin 13.0 - 17.0 g/dL 10.8(L) 12.3(L) 11.9(L)  Hematocrit 39 - 52 % 33.5(L) 37.4(L) 35.4(L)  Platelets 150 - 400 K/uL 157 180 156    CMP Latest Ref Rng & Units 03/04/2020 09/14/2019 07/13/2019  Glucose 65 - 99 mg/dL 100(H) 117(H) 136(H)  BUN 10 - 36 mg/dL 31 28(H) 24  Creatinine 0.76 - 1.27 mg/dL 1.49(H) 1.60(H) 1.46(H)  Sodium 134 - 144 mmol/L 138 138 138  Potassium 3.5 - 5.2 mmol/L 4.4 3.5 3.7  Chloride 96 - 106 mmol/L 99 99 99  CO2 20 - 29 mmol/L 25 28 26   Calcium 8.6 - 10.2 mg/dL 9.0 9.0 9.1  Total Protein 6.5 - 8.1 g/dL - 7.5 -  Total Bilirubin 0.3 - 1.2 mg/dL - 1.4(H) -  Alkaline Phos 38 - 126 U/L - 68 -  AST 15 - 41 U/L - 22 -  ALT 0 - 44 U/L - 15 -   RADIOGRAPHIC STUDIES: I have personally reviewed the radiological images as listed and agreed with the findings in the report.  CLINICAL DATA:  Subsequent treatment strategy for large B-cell lymphoma of the right maxillofacial region. The patient received radiation therapy from 03/20/2019 to 03/29/2019.  EXAM: NUCLEAR MEDICINE PET SKULL BASE TO THIGH  TECHNIQUE: 7.5 mCi F-18 FDG was injected intravenously. Full-ring PET imaging was performed from the skull base to thigh after the radiotracer. CT data was obtained and used for attenuation correction and anatomic localization.  Fasting blood glucose: 92 mg/dl  COMPARISON:  Multiple exams, including 03/08/2019  FINDINGS: Mediastinal  blood pool activity: SUV max 2.3  Liver activity: SUV max 3.9  NECK:  Demineralization of the anterior wall the right maxillary sinus with mild soft tissue prominence in this vicinity with a very soft tissue thickening up to 0.8 cm (formerly 1.1 cm) with maximum SUV in this region 3.0 (formerly 2.8). This is currently Deauville 2 activity and was previously Deauville 2 activity.  There is a small amount of regional dental activity. No hypermetabolic adenopathy in the neck.  Incidental CT findings: Bilateral common carotid atherosclerotic calcification. Left mastoidectomy.  CHEST: No significant abnormal hypermetabolic activity in this region.  Incidental CT findings: Coronary, aortic arch, and branch vessel atherosclerotic vascular disease. Mild cardiomegaly. Trace bilateral pleural effusions. Faint scarring posteriorly in the right upper lobe.  ABDOMEN/PELVIS: Physiologic activity in bowel. No significant abnormal hypermetabolic activity in this region.  Incidental CT findings: Aortoiliac atherosclerotic vascular disease. No splenomegaly. Sigmoid colon diverticulosis.  SKELETON: Accentuated activity along the spine extensive spurring at the L4-5 level, probably inflammatory, more notable on the right side where maximum SUV is currently 5.5. Accentuated activity along joint capsule of the glenohumeral joints and hip joints probably from arthropathy.  Incidental CT findings: Bridging spurring of the sacroiliac joints. Dextroconvex lumbar scoliosis. Multilevel lumbar spondylosis and degenerative disc disease. Postoperative findings at L3-4 and L4-5 with posterior decompression at L4.  IMPRESSION: 1. Demineralization of the anterior wall the right maxillary sinus with mild soft tissue thickening in this vicinity (currently 0.8 cm, formerly 1.1 cm) persistent over L2 activity in this region. 2. No adenopathy or other regions of suspicion for malignancy. 3. Other imaging findings of potential clinical significance: Aortic Atherosclerosis  (ICD10-I70.0). Coronary atherosclerosis. Mild cardiomegaly. Trace bilateral pleural effusions. Sigmoid colon diverticulosis. Lumbar spondylosis and degenerative disc disease with some likely inflammatory activity along the prominent marginal spurring at the L4-5 level.   Electronically Signed   By: Van Clines M.D.   On: 07/04/2019 14:36  No results found.  ASSESSMENT & PLAN DOUGLASS DUNSHEE 86 y.o. 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  After review the labs, review the imaging and discussion with the patient his findings are most consistent with high-grade B-cell lymphoma in remission following palliative radiation therapy.  At this time I would not recommend routine imaging as we would not recommend chemotherapy in the event he were to have relapsed disease.  I think we continue to follow clinically and in the event the patient were to have any symptoms we could consider reimaging at that time.  We will plan to see Mr. Hollenback back in approximately 6 months time to reevaluate or recurrence.   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  --will recommend f/u in 6 months time to re-evaluate. Can be seen sooner in the interim if new symptoms or laboratory abnormalities were to develop.   No orders of the defined types were placed in this encounter.   All questions were answered. The patient knows to call the clinic with any problems, questions or concerns.  A total of more than 30 minutes were  spent on this encounter  and over half of that time was spent on counseling and coordination of care as outlined above.   Ledell Peoples, MD Department of Hematology/Oncology [place] [facility] Phone: [phone] Pager: [phone] Email: [email or web address]  03/22/2020 3:49 PM

## 2020-03-27 ENCOUNTER — Telehealth: Payer: Self-pay | Admitting: Hematology and Oncology

## 2020-03-27 NOTE — Telephone Encounter (Signed)
Scheduled per los. Called and left msg. Mailed printout  °

## 2020-06-01 ENCOUNTER — Other Ambulatory Visit: Payer: Self-pay | Admitting: Cardiology

## 2020-06-03 ENCOUNTER — Other Ambulatory Visit: Payer: Self-pay | Admitting: Cardiology

## 2020-08-29 ENCOUNTER — Other Ambulatory Visit: Payer: Self-pay | Admitting: Cardiology

## 2020-09-25 ENCOUNTER — Other Ambulatory Visit: Payer: Self-pay

## 2020-09-25 ENCOUNTER — Other Ambulatory Visit: Payer: Self-pay | Admitting: Hematology and Oncology

## 2020-09-25 ENCOUNTER — Inpatient Hospital Stay: Payer: Medicare Other | Attending: Hematology and Oncology | Admitting: Hematology and Oncology

## 2020-09-25 ENCOUNTER — Inpatient Hospital Stay: Payer: Medicare Other

## 2020-09-25 ENCOUNTER — Encounter: Payer: Self-pay | Admitting: Hematology and Oncology

## 2020-09-25 VITALS — BP 136/73 | HR 63 | Temp 97.5°F | Resp 17 | Wt 161.4 lb

## 2020-09-25 DIAGNOSIS — Z7189 Other specified counseling: Secondary | ICD-10-CM | POA: Diagnosis not present

## 2020-09-25 DIAGNOSIS — C8331 Diffuse large B-cell lymphoma, lymph nodes of head, face, and neck: Secondary | ICD-10-CM

## 2020-09-25 DIAGNOSIS — Z8572 Personal history of non-Hodgkin lymphomas: Secondary | ICD-10-CM | POA: Insufficient documentation

## 2020-09-25 DIAGNOSIS — N400 Enlarged prostate without lower urinary tract symptoms: Secondary | ICD-10-CM | POA: Diagnosis not present

## 2020-09-25 DIAGNOSIS — Z7901 Long term (current) use of anticoagulants: Secondary | ICD-10-CM | POA: Diagnosis not present

## 2020-09-25 DIAGNOSIS — Z87891 Personal history of nicotine dependence: Secondary | ICD-10-CM | POA: Insufficient documentation

## 2020-09-25 DIAGNOSIS — Z79899 Other long term (current) drug therapy: Secondary | ICD-10-CM | POA: Diagnosis not present

## 2020-09-25 DIAGNOSIS — I4891 Unspecified atrial fibrillation: Secondary | ICD-10-CM | POA: Diagnosis not present

## 2020-09-25 DIAGNOSIS — I2510 Atherosclerotic heart disease of native coronary artery without angina pectoris: Secondary | ICD-10-CM | POA: Diagnosis not present

## 2020-09-25 DIAGNOSIS — D649 Anemia, unspecified: Secondary | ICD-10-CM

## 2020-09-25 DIAGNOSIS — I10 Essential (primary) hypertension: Secondary | ICD-10-CM | POA: Diagnosis not present

## 2020-09-25 DIAGNOSIS — Z9221 Personal history of antineoplastic chemotherapy: Secondary | ICD-10-CM | POA: Diagnosis not present

## 2020-09-25 DIAGNOSIS — Z923 Personal history of irradiation: Secondary | ICD-10-CM | POA: Diagnosis not present

## 2020-09-25 LAB — CBC WITH DIFFERENTIAL (CANCER CENTER ONLY)
Abs Immature Granulocytes: 0 10*3/uL (ref 0.00–0.07)
Basophils Absolute: 0.1 10*3/uL (ref 0.0–0.1)
Basophils Relative: 1 %
Eosinophils Absolute: 0.1 10*3/uL (ref 0.0–0.5)
Eosinophils Relative: 2 %
HCT: 36.6 % — ABNORMAL LOW (ref 39.0–52.0)
Hemoglobin: 12 g/dL — ABNORMAL LOW (ref 13.0–17.0)
Immature Granulocytes: 0 %
Lymphocytes Relative: 35 %
Lymphs Abs: 1.7 10*3/uL (ref 0.7–4.0)
MCH: 31.1 pg (ref 26.0–34.0)
MCHC: 32.8 g/dL (ref 30.0–36.0)
MCV: 94.8 fL (ref 80.0–100.0)
Monocytes Absolute: 0.6 10*3/uL (ref 0.1–1.0)
Monocytes Relative: 11 %
Neutro Abs: 2.5 10*3/uL (ref 1.7–7.7)
Neutrophils Relative %: 51 %
Platelet Count: 164 10*3/uL (ref 150–400)
RBC: 3.86 MIL/uL — ABNORMAL LOW (ref 4.22–5.81)
RDW: 14.3 % (ref 11.5–15.5)
WBC Count: 4.9 10*3/uL (ref 4.0–10.5)
nRBC: 0 % (ref 0.0–0.2)

## 2020-09-25 LAB — CMP (CANCER CENTER ONLY)
ALT: 9 U/L (ref 0–44)
AST: 17 U/L (ref 15–41)
Albumin: 3.9 g/dL (ref 3.5–5.0)
Alkaline Phosphatase: 83 U/L (ref 38–126)
Anion gap: 10 (ref 5–15)
BUN: 23 mg/dL (ref 8–23)
CO2: 26 mmol/L (ref 22–32)
Calcium: 9.3 mg/dL (ref 8.9–10.3)
Chloride: 102 mmol/L (ref 98–111)
Creatinine: 1.7 mg/dL — ABNORMAL HIGH (ref 0.61–1.24)
GFR, Estimated: 36 mL/min — ABNORMAL LOW (ref >60)
Glucose, Bld: 105 mg/dL — ABNORMAL HIGH (ref 70–99)
Potassium: 4.5 mmol/L (ref 3.5–5.1)
Sodium: 138 mmol/L (ref 135–145)
Total Bilirubin: 1.5 mg/dL — ABNORMAL HIGH (ref 0.3–1.2)
Total Protein: 7.7 g/dL (ref 6.5–8.1)

## 2020-09-25 LAB — LACTATE DEHYDROGENASE: LDH: 150 U/L (ref 98–192)

## 2020-09-25 NOTE — Progress Notes (Signed)
[facility] [phone]   [phone]  PROGRESS NOTE  Patient Care Team: [name], MD as PCP - General (Family Medicine) Stanford Breed Denice Bors, MD as PCP - Cardiology (Cardiology) [name], MD (Inactive) as Consulting Physician (Hematology) Rozetta Nunnery, MD as Consulting Physician (Otolaryngology) Eppie Gibson, MD as Attending Physician (Radiation Oncology) Karie Mainland, RD as Dietitian (Nutrition)  Hematological/Oncological History 1. 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 Large R maxillary sinus mass (~5.6cm) with multifocal bony erosion, including R masticator space, pterygopalatine fossa, and inferior orbit; no cervical lymphadenopathy  ? Atypical lymphocytes suspicious, but not diagnostic, for lymphoma on FNA  ? R maxillary core bx; path showed high-grade B-cell lymphoma, EBV FISH neg, Ki-67 ~90%; FISH positive for trisomy 69, negative   09/14/2019: transfer care to Dr. Lorenso Courier   Interval History:  Ralph Mendoza 87 y.o. 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 His last visit was on 03/22/2020. In the interim since the last visit he has had no ED visits or hospitalizations.   On exam today Ralph Mendoza is accompanied by his son-in-law.  He unfortunately is very hard of hearing.  His son-in-law answers most of his questions for him.  He reports that the patient is "slowing down".  He notes that his eye sight is continually worsening due to macular degeneration.  The patient notes they look like a "gray blob".  The patient does sleep more often and they are currently trying to get him to eat more.  Fortunately his weight has been stable over the last 6 months.  He currently denies any sinus pain, fevers, chills, sweats, nausea, vomiting or diarrhea.  A full 10 point ROS is listed  below.  MEDICAL HISTORY:  Past Medical History:  Diagnosis Date  . Atrial fibrillation ([place])   . BPH (benign prostatic hyperplasia)   . Dyspnea on exertion    a. 09/2008 NL EF on myoview.  [name] Hearing difficulty    R sided hole in eardrum, only wears L hearing aid  . History of tobacco abuse   . Hypertension   . Lumbar stenosis with neurogenic claudication    a. 02/2011 s/p decompressive laminectomy.  . Midsternal chest pain    a. 09/2008 Myoview: EF 56%, mild fixed thinning of inf wall particularly @ base -? attenuation.  No ischemia  . PUD (peptic ulcer disease)    Remote, history of  . Urinary hesitancy     SURGICAL HISTORY: Past Surgical History:  Procedure Laterality Date  . HERNIA REPAIR     Left  . LUMBAR LAMINECTOMY/DECOMPRESSION MICRODISCECTOMY  03/04/2011   Procedure: LUMBAR LAMINECTOMY/DECOMPRESSION MICRODISCECTOMY;  Surgeon: Ophelia Charter;  Location: [facility];  Service: Neurosurgery;  Laterality: N/A;  LUMBAR THREE-FOUR ,FOUR-FIVE LAMINECTOMIES  . TYMPANOMASTOIDECTOMY     Left modified radical with type 3 tympanoplasty    SOCIAL HISTORY: Social History   Socioeconomic History  . Marital status: Widowed    Spouse name: Not on file  . Number of children: 2  . Years of education: Not on file  . Highest education level: Not on file  Occupational History  . Occupation: [employer]    Comment: Retired  Tobacco Use  . Smoking status: Former Research scientist (life sciences)  . Smokeless tobacco: Never Used  . Tobacco comment: Remote history of smoking  Vaping Use  . Vaping  Use: Never used  Substance and Sexual Activity  . Alcohol use: No  . Drug use: No  . Sexual activity: Not on file  Other Topics Concern  . Not on file  Social History Narrative   Lives with his wife in [place].   He swims daily.   He has 2 grown daughters.   1 Caffeine drink daily    Social Determinants of Health   Financial Resource Strain: Not on file  Food Insecurity: Not on file   Transportation Needs: Not on file  Physical Activity: Not on file  Stress: Not on file  Social Connections: Not on file  Intimate Partner Violence: Not on file    FAMILY HISTORY: Family History  Problem Relation Age of Onset  . Lung cancer Mother   . Appendicitis Father   . Diabetes Sister        DM  . Coronary artery disease Neg Hx        No premature CAD  . Colon cancer Neg Hx     ALLERGIES:  has No Known Allergies.  MEDICATIONS:  Current Outpatient Medications  Medication Sig Dispense Refill  . acetaminophen (TYLENOL) 500 MG tablet Take 1,000 mg by mouth every 6 (six) hours as needed for mild pain or moderate pain.    [name] Dextromethorphan-Guaifenesin (DELSYM COUGH/CHEST CONGEST DM PO) Take 10 mLs by mouth daily as needed (for cough).     [name] ELIQUIS 2.5 MG TABS tablet TAKE 1 TABLET BY MOUTH  TWICE DAILY 180 tablet 3  . furosemide (LASIX) 20 MG tablet TAKE 2 TABLETS BY MOUTH  DAILY 180 tablet 3  . isosorbide dinitrate (ISORDIL) 30 MG tablet TAKE 1 TABLET BY MOUTH  DAILY 90 tablet 3  . losartan (COZAAR) 25 MG tablet TAKE 1 TABLET BY MOUTH  DAILY 90 tablet 3  . Multiple Vitamins-Minerals (PRESERVISION AREDS 2+MULTI VIT) CAPS Take 1 capsule by mouth daily.    [name] oxyCODONE (OXY IR/ROXICODONE) 5 MG immediate release tablet Take 5 mg by mouth 4 (four) times daily as needed.    . potassium chloride SA (KLOR-CON) 20 MEQ tablet TAKE 1 TABLET BY MOUTH  DAILY 90 tablet 3   No current facility-administered medications for this visit.    REVIEW OF SYSTEMS:   Constitutional: ( - ) fevers, ( - )  chills , ( - ) night sweats Eyes: ( - ) blurriness of vision, ( - ) double vision, ( - ) watery eyes Ears, nose, mouth, throat, and face: ( - ) mucositis, ( - ) sore throat Respiratory: ( - ) cough, ( - ) dyspnea, ( - ) wheezes Cardiovascular: ( - ) palpitation, ( - ) chest discomfort, ( - ) lower extremity swelling Gastrointestinal:  ( - ) nausea, ( - ) heartburn, ( - ) change in bowel  habits Skin: ( - ) abnormal skin rashes Lymphatics: ( - ) new lymphadenopathy, ( - ) easy bruising Neurological: ( - ) numbness, ( - ) tingling, ( - ) new weaknesses Behavioral/Psych: ( - ) mood change, ( - ) new changes  All other systems were reviewed with the patient and are negative.  PHYSICAL EXAMINATION: ECOG PERFORMANCE STATUS: 3 - Symptomatic, >50% confined to bed  Vitals:   09/25/20 1544  BP: 136/73  Pulse: 63  Resp: 17  Temp: (!) 97.5 F (36.4 C)  SpO2: 100%   Filed Weights   09/25/20 1544  Weight: 161 lb 6.4 oz (73.2 kg)    GENERAL: well appearing elderly  Caucasian male. alert, no distress and comfortable SKIN: skin color, texture, turgor are normal, no rashes or significant lesions EYES: conjunctiva are pink and non-injected, sclera clear NECK: supple, non-tender LYMPH:  no palpable lymphadenopathy in the cervical, axillary or inguinal LUNGS: clear to auscultation and percussion with normal breathing effort HEART: regular rate & rhythm and no murmurs and no lower extremity edema Musculoskeletal: no cyanosis of digits and no clubbing  PSYCH: alert & oriented x 3, fluent speech NEURO: no focal motor/sensory deficits  LABORATORY DATA:  I have reviewed the data as listed CBC Latest Ref Rng & Units 09/25/2020 03/22/2020 03/04/2020  WBC 4.0 - 10.5 K/uL 4.9 4.2 4.8  Hemoglobin 13.0 - 17.0 g/dL 12.0(L) 10.8(L) 12.3(L)  Hematocrit 39.0 - 52.0 % 36.6(L) 33.5(L) 37.4(L)  Platelets 150 - 400 K/uL 164 157 180    CMP Latest Ref Rng & Units 03/22/2020 03/04/2020 09/14/2019  Glucose 70 - 99 mg/dL 134(H) 100(H) 117(H)  BUN 8 - 23 mg/dL 33(H) 31 28(H)  Creatinine 0.61 - 1.24 mg/dL 1.67(H) 1.49(H) 1.60(H)  Sodium 135 - 145 mmol/L 138 138 138  Potassium 3.5 - 5.1 mmol/L 4.3 4.4 3.5  Chloride 98 - 111 mmol/L 102 99 99  CO2 22 - 32 mmol/L _0 Calcium 8.9 - 10.3 mg/dL 9.1 9.0 9.0  Total Protein 6.5 - 8.1 g/dL 7.3 - 7.5  Total Bilirubin 0.3 - 1.2 mg/dL 1.4(H) - 1.4(H)   Alkaline Phos 38 - 126 U/L 79 - 68  AST 15 - 41 U/L 17 - 22  ALT 0 - 44 U/L 9 - 15   RADIOGRAPHIC STUDIES: I have personally reviewed the radiological images as listed and agreed with the findings in the report.  CLINICAL DATA:  Subsequent treatment strategy for large B-cell lymphoma of the right maxillofacial region. The patient received radiation therapy from 03/20/2019 to 03/29/2019.  EXAM: NUCLEAR MEDICINE PET SKULL BASE TO THIGH  TECHNIQUE: 7.5 mCi F-18 FDG was injected intravenously. Full-ring PET imaging was performed from the skull base to thigh after the radiotracer. CT data was obtained and used for attenuation correction and anatomic localization.  Fasting blood glucose: 92 mg/dl  COMPARISON:  Multiple exams, including 03/08/2019  FINDINGS: Mediastinal blood pool activity: SUV max 2.3  Liver activity: SUV max 3.9  NECK: Demineralization of the anterior wall the right maxillary sinus with mild soft tissue prominence in this vicinity with a very soft tissue thickening up to 0.8 cm (formerly 1.1 cm) with maximum SUV in this region 3.0 (formerly 2.8). This is currently Deauville 2 activity and was previously Deauville 2 activity.  There is a small amount of regional dental activity. No hypermetabolic adenopathy in the neck.  Incidental CT findings: Bilateral common carotid atherosclerotic calcification. Left mastoidectomy.  CHEST: No significant abnormal hypermetabolic activity in this region.  Incidental CT findings: Coronary, aortic arch, and branch vessel atherosclerotic vascular disease. Mild cardiomegaly. Trace bilateral pleural effusions. Faint scarring posteriorly in the right upper lobe.  ABDOMEN/PELVIS: Physiologic activity in bowel. No significant abnormal hypermetabolic activity in this region.  Incidental CT findings: Aortoiliac atherosclerotic vascular disease. No splenomegaly. Sigmoid colon diverticulosis.  SKELETON:  Accentuated activity along the spine extensive spurring at the L4-5 level, probably inflammatory, more notable on the right side where maximum SUV is currently 5.5. Accentuated activity along joint capsule of the glenohumeral joints and hip joints probably from arthropathy.  Incidental CT findings: Bridging spurring of the sacroiliac joints. Dextroconvex lumbar scoliosis. Multilevel lumbar spondylosis and degenerative disc disease.  Postoperative findings at L3-4 and L4-5 with posterior decompression at L4.  IMPRESSION: 1. Demineralization of the anterior wall the right maxillary sinus with mild soft tissue thickening in this vicinity (currently 0.8 cm, formerly 1.1 cm) persistent over L2 activity in this region. 2. No adenopathy or other regions of suspicion for malignancy. 3. Other imaging findings of potential clinical significance: Aortic Atherosclerosis (ICD10-I70.0). Coronary atherosclerosis. Mild cardiomegaly. Trace bilateral pleural effusions. Sigmoid colon diverticulosis. Lumbar spondylosis and degenerative disc disease with some likely inflammatory activity along the prominent marginal spurring at the L4-5 level.   Electronically Signed   By: Van Clines M.D.   On: 07/04/2019 14:36  No results found.  ASSESSMENT & PLAN Ralph Mendoza 87 y.o. 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  After review the labs, review the imaging and discussion with the patient his findings are most consistent with high-grade B-cell lymphoma in remission following palliative radiation therapy.  At this time I would not recommend routine imaging as we would not recommend chemotherapy in the event he were to have relapsed disease.  I think we continue to follow clinically and in the event the patient were to have any symptoms we could consider reimaging at that time.   We will plan to see Ralph Mendoza back in approximately 6 months time to reevaluate or recurrence.   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  -- f/u in 6 months time to re-evaluate. Can be seen sooner in the interim if new symptoms or laboratory abnormalities were to develop.   No orders of the defined types were placed in this encounter.   All questions were answered. The patient knows to call the clinic with any problems, questions or concerns.  A total of more than 25 minutes were spent on this encounter and over half of that time was spent on counseling and coordination of care as outlined above.   Ledell Peoples, MD Department of Hematology/Oncology [place] [facility] Phone: [phone] Pager: [phone] Email: [email or web address]  09/25/2020 3:58 PM

## 2020-09-27 ENCOUNTER — Telehealth: Payer: Self-pay | Admitting: Hematology and Oncology

## 2020-09-27 NOTE — Telephone Encounter (Signed)
Scheduled per los. Called and left msg. Mailed printout  °

## 2020-11-03 ENCOUNTER — Other Ambulatory Visit: Payer: Self-pay | Admitting: Cardiology

## 2020-11-21 ENCOUNTER — Other Ambulatory Visit: Payer: Self-pay | Admitting: Cardiology

## 2020-11-22 NOTE — Telephone Encounter (Signed)
Prescription refill request for Eliquis received. Indication:atrial fib Last office visit:11/21 Scr:1.6 Age: 87 Weight:73.2 kg  Prescription refilled

## 2021-01-07 ENCOUNTER — Emergency Department (HOSPITAL_BASED_OUTPATIENT_CLINIC_OR_DEPARTMENT_OTHER): Payer: Medicare Other

## 2021-01-07 ENCOUNTER — Other Ambulatory Visit: Payer: Self-pay

## 2021-01-07 ENCOUNTER — Emergency Department (HOSPITAL_BASED_OUTPATIENT_CLINIC_OR_DEPARTMENT_OTHER)
Admission: EM | Admit: 2021-01-07 | Discharge: 2021-01-07 | Disposition: A | Discharge status: Home / Self Care | Payer: Medicare Other | Attending: Emergency Medicine | Admitting: Emergency Medicine

## 2021-01-07 ENCOUNTER — Encounter (HOSPITAL_BASED_OUTPATIENT_CLINIC_OR_DEPARTMENT_OTHER): Payer: Self-pay | Admitting: Emergency Medicine

## 2021-01-07 DIAGNOSIS — I4891 Unspecified atrial fibrillation: Secondary | ICD-10-CM | POA: Diagnosis not present

## 2021-01-07 DIAGNOSIS — K5900 Constipation, unspecified: Secondary | ICD-10-CM | POA: Diagnosis present

## 2021-01-07 DIAGNOSIS — K567 Ileus, unspecified: Secondary | ICD-10-CM

## 2021-01-07 DIAGNOSIS — Z79899 Other long term (current) drug therapy: Secondary | ICD-10-CM | POA: Insufficient documentation

## 2021-01-07 DIAGNOSIS — I130 Hypertensive heart and chronic kidney disease with heart failure and stage 1 through stage 4 chronic kidney disease, or unspecified chronic kidney disease: Secondary | ICD-10-CM | POA: Insufficient documentation

## 2021-01-07 DIAGNOSIS — Z20822 Contact with and (suspected) exposure to covid-19: Secondary | ICD-10-CM | POA: Insufficient documentation

## 2021-01-07 DIAGNOSIS — Z7901 Long term (current) use of anticoagulants: Secondary | ICD-10-CM | POA: Diagnosis not present

## 2021-01-07 DIAGNOSIS — Z87891 Personal history of nicotine dependence: Secondary | ICD-10-CM | POA: Diagnosis not present

## 2021-01-07 DIAGNOSIS — N183 Chronic kidney disease, stage 3 unspecified: Secondary | ICD-10-CM | POA: Diagnosis not present

## 2021-01-07 DIAGNOSIS — I5022 Chronic systolic (congestive) heart failure: Secondary | ICD-10-CM | POA: Diagnosis not present

## 2021-01-07 LAB — COMPREHENSIVE METABOLIC PANEL
ALT: 6 U/L (ref 0–44)
AST: 14 U/L — ABNORMAL LOW (ref 15–41)
Albumin: 4.1 g/dL (ref 3.5–5.0)
Alkaline Phosphatase: 69 U/L (ref 38–126)
Anion gap: 11 (ref 5–15)
BUN: 30 mg/dL — ABNORMAL HIGH (ref 8–23)
CO2: 27 mmol/L (ref 22–32)
Calcium: 9.5 mg/dL (ref 8.9–10.3)
Chloride: 98 mmol/L (ref 98–111)
Creatinine, Ser: 1.55 mg/dL — ABNORMAL HIGH (ref 0.61–1.24)
GFR, Estimated: 40 mL/min — ABNORMAL LOW (ref >60)
Glucose, Bld: 122 mg/dL — ABNORMAL HIGH (ref 70–99)
Potassium: 4 mmol/L (ref 3.5–5.1)
Sodium: 136 mmol/L (ref 135–145)
Total Bilirubin: 1.9 mg/dL — ABNORMAL HIGH (ref 0.3–1.2)
Total Protein: 7.5 g/dL (ref 6.5–8.1)

## 2021-01-07 LAB — CBC WITH DIFFERENTIAL/PLATELET
Abs Immature Granulocytes: 0.03 10*3/uL (ref 0.00–0.07)
Basophils Absolute: 0 10*3/uL (ref 0.0–0.1)
Basophils Relative: 0 %
Eosinophils Absolute: 0 10*3/uL (ref 0.0–0.5)
Eosinophils Relative: 0 %
HCT: 34.4 % — ABNORMAL LOW (ref 39.0–52.0)
Hemoglobin: 11.3 g/dL — ABNORMAL LOW (ref 13.0–17.0)
Immature Granulocytes: 0 %
Lymphocytes Relative: 13 %
Lymphs Abs: 1 10*3/uL (ref 0.7–4.0)
MCH: 30.6 pg (ref 26.0–34.0)
MCHC: 32.8 g/dL (ref 30.0–36.0)
MCV: 93.2 fL (ref 80.0–100.0)
Monocytes Absolute: 0.7 10*3/uL (ref 0.1–1.0)
Monocytes Relative: 10 %
Neutro Abs: 5.7 10*3/uL (ref 1.7–7.7)
Neutrophils Relative %: 77 %
Platelets: 171 10*3/uL (ref 150–400)
RBC: 3.69 MIL/uL — ABNORMAL LOW (ref 4.22–5.81)
RDW: 13.9 % (ref 11.5–15.5)
WBC: 7.5 10*3/uL (ref 4.0–10.5)
nRBC: 0 % (ref 0.0–0.2)

## 2021-01-07 LAB — RESP PANEL BY RT-PCR (FLU A&B, COVID) ARPGX2
Influenza A by PCR: NEGATIVE
Influenza B by PCR: NEGATIVE
SARS Coronavirus 2 by RT PCR: NEGATIVE

## 2021-01-07 LAB — URINALYSIS, ROUTINE W REFLEX MICROSCOPIC
Bilirubin Urine: NEGATIVE
Glucose, UA: NEGATIVE mg/dL
Hgb urine dipstick: NEGATIVE
Ketones, ur: NEGATIVE mg/dL
Leukocytes,Ua: NEGATIVE
Nitrite: NEGATIVE
Protein, ur: NEGATIVE mg/dL
Specific Gravity, Urine: 1.019 (ref 1.005–1.030)
pH: 5.5 (ref 5.0–8.0)

## 2021-01-07 NOTE — ED Provider Notes (Signed)
  Physical Exam  BP (!) 142/72 (BP Location: Right Arm)   Pulse 77   Temp 98 F (36.7 C) (Temporal)   Resp 16   SpO2 100%   Physical Exam  ED Course/Procedures   Clinical Course as of 01/07/21 1351  Tue Jan 07, 2021  0626 Attempted to contact patient's daughter for collateral information.  No answer. [CH]  [phone] Spoke to patient's daughter Juliann Pulse.  Patient in normal state of health yesterday.  Did not complain of anything including constipation.  Lives in independent living but does have nurses help. [CH]  1000 I had spoken with patient's daughter in [place] around [place].  Patient had normal LFTs at that time.  She reported that family can come pick him up, close to noon.  Patient was reassessed.  He felt okay.  X-rays show questionable ileus.  Patient is not a good historian and is unable to tell me if he actually had a BM yesterday.  It appears that he was disimpacting himself at the facility prior to coming to the ER.  We will get CT scan to be sure that there is no partial small bowel obstruction. [AN]  1350 Family now at the bedside.  The son-in-law states that patient appears to be at baseline and patient has no complaints from his side.  Patient is comfortable going back to [place].  Results of the ER work-up discussed with them.  Informed them that it would be a good idea to keep monitoring him closely for the next day or 2, and to bring him back in the ER if his symptoms are getting worse. [AN]    Clinical Course User Index [AN] Varney Biles, MD [CH] Dina Rich, Barbette Hair, MD    Procedures  MDM   87 y.o M comes in with cc of constipation x 2 days. Had a large BM in the [place]. Labs pending.  ? Confusion - could be delerium as he has not slept well. Need to f/u labs and reassess.         Varney Biles, MD 01/07/21 1351

## 2021-01-07 NOTE — ED Provider Notes (Signed)
[facility] Provider Note   CSN: [id number] Arrival date & time: 01/07/21  0033     History Chief Complaint  Patient presents with   Constipation    Ralph Mendoza is a 87 y.o. male.  HPI     This is a 87 year old male with a history of atrial fibrillation, hypertension who lives in independent living who presents with reported constipation.  Per EMS, he reported constipation x2 days.  Patient reports "hard balls and raisons of stool."  He states that he strained most of the afternoon but was afraid he would cause a hernia.  He denies any abdominal pain.  No nausea or vomiting.  He denies chest pain or shortness of breath.  No fevers.  Patient had a large bowel movement in triage prior to my evaluation.  He is unable to tell me whether this relieved his symptoms.  He perseverates on "you need to tell me what is wrong and why I have raisins."  He does appear oriented but I cannot get him to further describe to me what he means by raisins.  Past Medical History:  Diagnosis Date   Atrial fibrillation ([facility])    BPH (benign prostatic hyperplasia)    Dyspnea on exertion    a. 09/2008 NL EF on myoview.   Hearing difficulty    R sided hole in eardrum, only wears L hearing aid   History of tobacco abuse    Hypertension    Lumbar stenosis with neurogenic claudication    a. 02/2011 s/p decompressive laminectomy.   Midsternal chest pain    a. 09/2008 Myoview: EF 56%, mild fixed thinning of inf wall particularly @ base -? attenuation.  No ischemia   PUD (peptic ulcer disease)    Remote, history of   Urinary hesitancy     Patient Active Problem List   Diagnosis Date Noted   Lymphoma of paranasal sinus ([place]) 03/13/2019   Hyperbilirubinemia 03/01/2019   Goals of care, counseling/discussion 03/01/2019   Diffuse large b-cell lymphoma, lymph nodes of head, face, and neck ([place]) 02/27/2019   Lobar pneumonia ([place]) 05/27/2017   Thrombocytopenia ([place]) 05/27/2017    Dehydration 62/22/9798   Chronic systolic CHF (congestive heart failure) ([place]) 05/27/2017   Recurrent right pleural effusion 09/27/2016   CKD (chronic kidney disease) stage 3, GFR 30-59 ml/min ([facility]) 09/27/2016   Normocytic anemia 09/27/2016   Atrial fibrillation ([place]) 05/17/2012   Essential hypertension 09/10/2008    Past Surgical History:  Procedure Laterality Date   HERNIA REPAIR     Left   LUMBAR LAMINECTOMY/DECOMPRESSION MICRODISCECTOMY  03/04/2011   Procedure: LUMBAR LAMINECTOMY/DECOMPRESSION MICRODISCECTOMY;  Surgeon: Ophelia Charter;  Location: [facility];  Service: Neurosurgery;  Laterality: N/A;  LUMBAR THREE-FOUR ,FOUR-FIVE LAMINECTOMIES   TYMPANOMASTOIDECTOMY     Left modified radical with type 3 tympanoplasty       Family History  Problem Relation Age of Onset   Lung cancer Mother    Appendicitis Father    Diabetes Sister        DM   Coronary artery disease Neg Hx        No premature CAD   Colon cancer Neg Hx     Social History   Tobacco Use   Smoking status: Former   Smokeless tobacco: Never   Tobacco comments:    Remote history of smoking  Vaping Use   Vaping Use: Never used  Substance Use Topics   Alcohol use: No   Drug use: No  Home Medications Prior to Admission medications   Medication Sig Start Date End Date Taking? Authorizing Provider  acetaminophen (TYLENOL) 500 MG tablet Take 1,000 mg by mouth every 6 (six) hours as needed for mild pain or moderate pain.    [provider]  apixaban (ELIQUIS) 2.5 MG TABS tablet TAKE 1 TABLET BY MOUTH  TWICE DAILY 11/22/20   Lelon Perla, MD  Dextromethorphan-Guaifenesin (DELSYM COUGH/CHEST CONGEST DM PO) Take 10 mLs by mouth daily as needed (for cough).     [provider]  furosemide (LASIX) 20 MG tablet TAKE 2 TABLETS BY MOUTH  DAILY 08/29/20   Lelon Perla, MD  isosorbide dinitrate (ISORDIL) 30 MG tablet TAKE 1 TABLET BY MOUTH  DAILY 06/03/20   Lelon Perla, MD   losartan (COZAAR) 25 MG tablet TAKE 1 TABLET BY MOUTH  DAILY 11/04/20   Lelon Perla, MD  Multiple Vitamins-Minerals (PRESERVISION AREDS 2+MULTI VIT) CAPS Take 1 capsule by mouth daily.    [provider]  oxyCODONE (OXY IR/ROXICODONE) 5 MG immediate release tablet Take 5 mg by mouth 4 (four) times daily as needed. 07/12/19   [provider]  potassium chloride SA (KLOR-CON) 20 MEQ tablet TAKE 1 TABLET BY MOUTH  DAILY 06/03/20   Lelon Perla, MD    Allergies    Patient has no known allergies.  Review of Systems   Review of Systems  Constitutional:  Negative for fever.  Cardiovascular:  Negative for chest pain.  Gastrointestinal:  Positive for constipation. Negative for abdominal pain, nausea and vomiting.  Genitourinary:  Negative for dysuria.  All other systems reviewed and are negative.  Physical Exam Updated Vital Signs BP (!) 108/48 (BP Location: Right Arm)   Pulse 73   Temp 97.8 F (36.6 C) (Oral)   Resp 18   SpO2 100%   Physical Exam Vitals and nursing note reviewed.  Constitutional:      Appearance: He is well-developed.     Comments: Elderly nontoxic-appearing  HENT:     Head: Normocephalic and atraumatic.     Nose: Nose normal.     Mouth/Throat:     Mouth: Mucous membranes are moist.  Eyes:     Pupils: Pupils are equal, round, and reactive to light.  Cardiovascular:     Rate and Rhythm: Normal rate and regular rhythm.  Pulmonary:     Effort: Pulmonary effort is normal. No respiratory distress.     Breath sounds: Normal breath sounds. No wheezing.  Abdominal:     General: Bowel sounds are normal.     Palpations: Abdomen is soft.     Tenderness: There is no abdominal tenderness. There is no rebound.  Genitourinary:    Comments: No hard stool noted in the rectal vault, brown stool on glove Musculoskeletal:     Cervical back: Neck supple.     Right lower leg: No edema.     Left lower leg: No edema.  Lymphadenopathy:     Cervical:  No cervical adenopathy.  Skin:    General: Skin is warm and dry.  Neurological:     Mental Status: He is alert and oriented to person, place, and time.     Comments: Oriented x3 but perseverates, sometimes difficult to obtain a full history from  Psychiatric:        Mood and Affect: Mood normal.    ED Results / Procedures / Treatments   Labs (all labs ordered are listed, but only abnormal results are displayed)  Labs Reviewed  CBC WITH DIFFERENTIAL/PLATELET  COMPREHENSIVE METABOLIC PANEL  URINALYSIS, ROUTINE W REFLEX MICROSCOPIC    EKG None  Radiology No results found.  Procedures Procedures   Medications Ordered in ED Medications - No data to display  ED Course  I have reviewed the triage vital signs and the nursing notes.  Pertinent labs & imaging results that were available during my care of the patient were reviewed by me and considered in my medical decision making (see chart for details).  Clinical Course as of 01/07/21 0635  Tue Jan 07, 2021  1093 Attempted to contact patient's daughter for collateral information.  No answer. [CH]  [phone] Spoke to patient's daughter Juliann Pulse.  Patient in normal state of health yesterday.  Did not complain of anything including constipation.  Lives in independent living but does have nurses help. [CH]    Clinical Course User Index [CH] Noris Kulinski, Barbette Hair, MD   MDM Rules/Calculators/A&P                           Patient presents with reported constipation.  He is nontoxic and vital signs are reassuring.  He had a large bowel movement prior to my arrival.  No hard stool noted in the rectal vault.  His history is somewhat confusing.  He is technically oriented x3 but perseverates and I cannot truly understand what his current complaint is.  It is unclear whether his bowel movement helped his symptoms.  For this reason, we will obtain a KUB and some basic lab work.  Daughter is agreeable to plan.   Final Clinical Impression(s) / ED  Diagnoses Final diagnoses:  None    Rx / DC Orders ED Discharge Orders     None        Nicolo Tomko, Barbette Hair, MD 01/07/21 [phone]

## 2021-01-07 NOTE — ED Notes (Signed)
Pt had large bowel movement. Pt was assisted back to room from bathroom via wheelchair. Pt was able to pivot/ambulate to bed with assistance.

## 2021-01-07 NOTE — ED Notes (Signed)
Pt family to go to car and get Pt clothes to wear home.

## 2021-01-07 NOTE — ED Notes (Addendum)
Patients daughter Butch Penny called and would like an update on if patient will be admitted or sent back to [facility], She lives in [place]. [phone]  Patient Daughter Tye [place] would like an update to, she lives in [place] and is likely to take patient to facility upon discharge. [phone]

## 2021-01-07 NOTE — ED Notes (Addendum)
MD instructed to have Pt ambulate. Pt unable to sit on the side of the bed due to weakness. Baseline unknown. MD to reach out to family and question daily function. Pad and bed changed, warm blanket provided. Tech to help RN pull patient up in bed and reposition.

## 2021-01-07 NOTE — ED Notes (Signed)
Pt D/C by other staff RN

## 2021-01-07 NOTE — Discharge Instructions (Addendum)
Mr. Standing was seen in the ER last night for constipation and abdominal discomfort.  He is CT scan of the abdomen is normal, no signs of obstruction or infection.  Urine test, blood work is also reassuring.  It appears that he had a large bowel movement while in the ER yesterday.  At this time, we recommend that we keep a close eye on him and monitor him periodically to make sure that he is responding and getting better.  Make sure he is hydrating well.  Consider clear liquid diet with soup, rice, fresh fruits and vegetables for diet if constipation remains a concern for the next few days.  Return to the ER if his symptoms are getting worse.

## 2021-01-07 NOTE — ED Triage Notes (Signed)
Pt presents to ED BIB [facility] from [facility]. Pt c/o constipation x2d.   140/90 HR - 86 97% RA

## 2021-01-11 ENCOUNTER — Other Ambulatory Visit: Payer: Self-pay

## 2021-01-11 ENCOUNTER — Encounter (HOSPITAL_COMMUNITY): Payer: Self-pay | Admitting: Emergency Medicine

## 2021-01-11 ENCOUNTER — Inpatient Hospital Stay (HOSPITAL_COMMUNITY)
Admission: EM | Admit: 2021-01-11 | Discharge: 2021-02-18 | DRG: 064 | Disposition: E | Payer: Medicare Other | Attending: Student in an Organized Health Care Education/Training Program | Admitting: Student in an Organized Health Care Education/Training Program

## 2021-01-11 ENCOUNTER — Emergency Department (HOSPITAL_COMMUNITY): Payer: Medicare Other

## 2021-01-11 DIAGNOSIS — E785 Hyperlipidemia, unspecified: Secondary | ICD-10-CM | POA: Diagnosis present

## 2021-01-11 DIAGNOSIS — I130 Hypertensive heart and chronic kidney disease with heart failure and stage 1 through stage 4 chronic kidney disease, or unspecified chronic kidney disease: Secondary | ICD-10-CM | POA: Diagnosis present

## 2021-01-11 DIAGNOSIS — Z515 Encounter for palliative care: Secondary | ICD-10-CM | POA: Diagnosis not present

## 2021-01-11 DIAGNOSIS — Z8673 Personal history of transient ischemic attack (TIA), and cerebral infarction without residual deficits: Secondary | ICD-10-CM

## 2021-01-11 DIAGNOSIS — N1832 Chronic kidney disease, stage 3b: Secondary | ICD-10-CM | POA: Diagnosis present

## 2021-01-11 DIAGNOSIS — R29718 NIHSS score 18: Secondary | ICD-10-CM | POA: Diagnosis present

## 2021-01-11 DIAGNOSIS — Z87891 Personal history of nicotine dependence: Secondary | ICD-10-CM

## 2021-01-11 DIAGNOSIS — N400 Enlarged prostate without lower urinary tract symptoms: Secondary | ICD-10-CM | POA: Diagnosis present

## 2021-01-11 DIAGNOSIS — Z20822 Contact with and (suspected) exposure to covid-19: Secondary | ICD-10-CM | POA: Diagnosis present

## 2021-01-11 DIAGNOSIS — W1839XA Other fall on same level, initial encounter: Secondary | ICD-10-CM | POA: Diagnosis present

## 2021-01-11 DIAGNOSIS — H9190 Unspecified hearing loss, unspecified ear: Secondary | ICD-10-CM | POA: Diagnosis present

## 2021-01-11 DIAGNOSIS — Z8711 Personal history of peptic ulcer disease: Secondary | ICD-10-CM

## 2021-01-11 DIAGNOSIS — H543 Unqualified visual loss, both eyes: Secondary | ICD-10-CM | POA: Diagnosis present

## 2021-01-11 DIAGNOSIS — Z79899 Other long term (current) drug therapy: Secondary | ICD-10-CM

## 2021-01-11 DIAGNOSIS — Z923 Personal history of irradiation: Secondary | ICD-10-CM

## 2021-01-11 DIAGNOSIS — E860 Dehydration: Secondary | ICD-10-CM

## 2021-01-11 DIAGNOSIS — R64 Cachexia: Secondary | ICD-10-CM | POA: Diagnosis present

## 2021-01-11 DIAGNOSIS — R0989 Other specified symptoms and signs involving the circulatory and respiratory systems: Secondary | ICD-10-CM | POA: Diagnosis not present

## 2021-01-11 DIAGNOSIS — J449 Chronic obstructive pulmonary disease, unspecified: Secondary | ICD-10-CM | POA: Diagnosis present

## 2021-01-11 DIAGNOSIS — R627 Adult failure to thrive: Secondary | ICD-10-CM | POA: Diagnosis present

## 2021-01-11 DIAGNOSIS — I5022 Chronic systolic (congestive) heart failure: Secondary | ICD-10-CM | POA: Diagnosis present

## 2021-01-11 DIAGNOSIS — I482 Chronic atrial fibrillation, unspecified: Secondary | ICD-10-CM | POA: Diagnosis present

## 2021-01-11 DIAGNOSIS — G9341 Metabolic encephalopathy: Secondary | ICD-10-CM | POA: Diagnosis present

## 2021-01-11 DIAGNOSIS — Z7189 Other specified counseling: Secondary | ICD-10-CM | POA: Diagnosis not present

## 2021-01-11 DIAGNOSIS — I639 Cerebral infarction, unspecified: Secondary | ICD-10-CM

## 2021-01-11 DIAGNOSIS — R29810 Facial weakness: Secondary | ICD-10-CM | POA: Diagnosis present

## 2021-01-11 DIAGNOSIS — G8194 Hemiplegia, unspecified affecting left nondominant side: Secondary | ICD-10-CM | POA: Diagnosis present

## 2021-01-11 DIAGNOSIS — Z7901 Long term (current) use of anticoagulants: Secondary | ICD-10-CM

## 2021-01-11 DIAGNOSIS — I2510 Atherosclerotic heart disease of native coronary artery without angina pectoris: Secondary | ICD-10-CM | POA: Diagnosis present

## 2021-01-11 DIAGNOSIS — R414 Neurologic neglect syndrome: Secondary | ICD-10-CM | POA: Diagnosis present

## 2021-01-11 DIAGNOSIS — Z6822 Body mass index (BMI) 22.0-22.9, adult: Secondary | ICD-10-CM

## 2021-01-11 DIAGNOSIS — I63411 Cerebral infarction due to embolism of right middle cerebral artery: Principal | ICD-10-CM | POA: Diagnosis present

## 2021-01-11 DIAGNOSIS — C8511 Unspecified B-cell lymphoma, lymph nodes of head, face, and neck: Secondary | ICD-10-CM | POA: Diagnosis present

## 2021-01-11 DIAGNOSIS — Z789 Other specified health status: Secondary | ICD-10-CM | POA: Diagnosis not present

## 2021-01-11 DIAGNOSIS — I63421 Cerebral infarction due to embolism of right anterior cerebral artery: Secondary | ICD-10-CM | POA: Diagnosis present

## 2021-01-11 DIAGNOSIS — Z66 Do not resuscitate: Secondary | ICD-10-CM | POA: Diagnosis present

## 2021-01-11 DIAGNOSIS — R262 Difficulty in walking, not elsewhere classified: Secondary | ICD-10-CM | POA: Diagnosis present

## 2021-01-11 LAB — TYPE AND SCREEN
ABO/RH(D): B POS
Antibody Screen: NEGATIVE

## 2021-01-11 LAB — CBC WITH DIFFERENTIAL/PLATELET
Abs Immature Granulocytes: 0.02 10*3/uL (ref 0.00–0.07)
Basophils Absolute: 0 10*3/uL (ref 0.0–0.1)
Basophils Relative: 0 %
Eosinophils Absolute: 0 10*3/uL (ref 0.0–0.5)
Eosinophils Relative: 0 %
HCT: 38.6 % — ABNORMAL LOW (ref 39.0–52.0)
Hemoglobin: 12.7 g/dL — ABNORMAL LOW (ref 13.0–17.0)
Immature Granulocytes: 0 %
Lymphocytes Relative: 10 %
Lymphs Abs: 0.8 10*3/uL (ref 0.7–4.0)
MCH: 31.5 pg (ref 26.0–34.0)
MCHC: 32.9 g/dL (ref 30.0–36.0)
MCV: 95.8 fL (ref 80.0–100.0)
Monocytes Absolute: 0.6 10*3/uL (ref 0.1–1.0)
Monocytes Relative: 8 %
Neutro Abs: 6.4 10*3/uL (ref 1.7–7.7)
Neutrophils Relative %: 82 %
Platelets: 193 10*3/uL (ref 150–400)
RBC: 4.03 MIL/uL — ABNORMAL LOW (ref 4.22–5.81)
RDW: 13.7 % (ref 11.5–15.5)
WBC: 7.8 10*3/uL (ref 4.0–10.5)
nRBC: 0 % (ref 0.0–0.2)

## 2021-01-11 LAB — COMPREHENSIVE METABOLIC PANEL
ALT: 13 U/L (ref 0–44)
AST: 37 U/L (ref 15–41)
Albumin: 3.8 g/dL (ref 3.5–5.0)
Alkaline Phosphatase: 67 U/L (ref 38–126)
Anion gap: 14 (ref 5–15)
BUN: 29 mg/dL — ABNORMAL HIGH (ref 8–23)
CO2: 22 mmol/L (ref 22–32)
Calcium: 9.2 mg/dL (ref 8.9–10.3)
Chloride: 98 mmol/L (ref 98–111)
Creatinine, Ser: 1.77 mg/dL — ABNORMAL HIGH (ref 0.61–1.24)
GFR, Estimated: 34 mL/min — ABNORMAL LOW (ref >60)
Glucose, Bld: 137 mg/dL — ABNORMAL HIGH (ref 70–99)
Potassium: 4.1 mmol/L (ref 3.5–5.1)
Sodium: 134 mmol/L — ABNORMAL LOW (ref 135–145)
Total Bilirubin: 1.9 mg/dL — ABNORMAL HIGH (ref 0.3–1.2)
Total Protein: 7.5 g/dL (ref 6.5–8.1)

## 2021-01-11 LAB — LACTIC ACID, PLASMA
Lactic Acid, Venous: 2.9 mmol/L (ref 0.5–1.9)
Lactic Acid, Venous: 2.1 mmol/L (ref 0.5–1.9)

## 2021-01-11 LAB — RESP PANEL BY RT-PCR (FLU A&B, COVID) ARPGX2
Influenza A by PCR: NEGATIVE
Influenza B by PCR: NEGATIVE
SARS Coronavirus 2 by RT PCR: NEGATIVE

## 2021-01-11 LAB — TROPONIN I (HIGH SENSITIVITY)
Troponin I (High Sensitivity): 58 ng/L — ABNORMAL HIGH (ref <18)
Troponin I (High Sensitivity): 80 ng/L — ABNORMAL HIGH (ref <18)

## 2021-01-11 LAB — PROTIME-INR
INR: 1.1 (ref 0.8–1.2)
Prothrombin Time: 14.6 seconds (ref 11.4–15.2)

## 2021-01-11 LAB — CK: Total CK: 936 U/L — ABNORMAL HIGH (ref 49–397)

## 2021-01-11 LAB — CBG MONITORING, ED: Glucose-Capillary: 134 mg/dL — ABNORMAL HIGH (ref 70–99)

## 2021-01-11 MED ORDER — SODIUM CHLORIDE 0.9 % IV SOLN: Freq: Once | INTRAVENOUS | Status: AC

## 2021-01-11 MED ORDER — LORAZEPAM 2 MG/ML IJ SOLN: 1.0000 mg | Freq: Once | INTRAMUSCULAR | Status: DC

## 2021-01-11 MED ORDER — HALOPERIDOL LACTATE 2 MG/ML PO CONC
0.5000 mg | ORAL | Status: DC | PRN
  Filled 2021-01-11: qty 0.3

## 2021-01-11 MED ORDER — PANTOPRAZOLE SODIUM 40 MG IV SOLR
40.0000 mg | INTRAVENOUS | Status: DC
  Administered 2021-01-11: 40 mg via INTRAVENOUS
  Filled 2021-01-11: qty 40

## 2021-01-11 MED ORDER — STROKE: EARLY STAGES OF RECOVERY BOOK
Freq: Once | Status: AC
  Administered 2021-01-11: 1
  Filled 2021-01-11: qty 1

## 2021-01-11 MED ORDER — GLYCOPYRROLATE 0.2 MG/ML IJ SOLN: 0.2000 mg | INTRAMUSCULAR | Status: DC | PRN

## 2021-01-11 MED ORDER — BISACODYL 10 MG RE SUPP: 10.0000 mg | Freq: Every day | RECTAL | Status: DC | PRN

## 2021-01-11 MED ORDER — SODIUM CHLORIDE 0.9 % IV BOLUS
500.0000 mL | Freq: Once | INTRAVENOUS | Status: AC
  Administered 2021-01-11: 500 mL via INTRAVENOUS

## 2021-01-11 MED ORDER — ONDANSETRON HCL 4 MG/2ML IJ SOLN: 4.0000 mg | Freq: Four times a day (QID) | INTRAMUSCULAR | Status: DC | PRN

## 2021-01-11 MED ORDER — LORAZEPAM 2 MG/ML IJ SOLN
1.0000 mg | INTRAMUSCULAR | Status: DC | PRN
Start: 2021-01-11 — End: 2021-01-18
  Administered 2021-01-13 – 2021-01-15 (×4): 1 mg via INTRAVENOUS
  Filled 2021-01-11 (×5): qty 1

## 2021-01-11 MED ORDER — SODIUM CHLORIDE 0.9 % IV SOLN: INTRAVENOUS | Status: DC

## 2021-01-11 MED ORDER — HEPARIN SODIUM (PORCINE) 5000 UNIT/ML IJ SOLN
5000.0000 [IU] | Freq: Two times a day (BID) | INTRAMUSCULAR | Status: DC
  Administered 2021-01-11: 5000 [IU] via SUBCUTANEOUS
  Filled 2021-01-11: qty 1

## 2021-01-11 MED ORDER — ACETAMINOPHEN 160 MG/5ML PO SOLN: 650.0000 mg | ORAL | Status: DC | PRN

## 2021-01-11 MED ORDER — GLYCOPYRROLATE 1 MG PO TABS
1.0000 mg | ORAL_TABLET | ORAL | Status: DC | PRN
  Filled 2021-01-11: qty 1

## 2021-01-11 MED ORDER — HALOPERIDOL 0.5 MG PO TABS
0.5000 mg | ORAL_TABLET | ORAL | Status: DC | PRN
  Filled 2021-01-11: qty 1

## 2021-01-11 MED ORDER — LORAZEPAM 1 MG PO TABS: 1.0000 mg | ORAL_TABLET | ORAL | Status: DC | PRN

## 2021-01-11 MED ORDER — ACETAMINOPHEN 650 MG RE SUPP
650.0000 mg | Freq: Four times a day (QID) | RECTAL | Status: DC | PRN
  Administered 2021-01-18: 650 mg via RECTAL
  Filled 2021-01-11: qty 1

## 2021-01-11 MED ORDER — BIOTENE DRY MOUTH MT LIQD
15.0000 mL | Freq: Two times a day (BID) | OROMUCOSAL | Status: DC
  Administered 2021-01-12 – 2021-01-17 (×11): 15 mL via TOPICAL

## 2021-01-11 MED ORDER — ACETAMINOPHEN 325 MG PO TABS: 650.0000 mg | ORAL_TABLET | Freq: Four times a day (QID) | ORAL | Status: DC | PRN

## 2021-01-11 MED ORDER — SENNOSIDES-DOCUSATE SODIUM 8.6-50 MG PO TABS: 1.0000 | ORAL_TABLET | Freq: Every evening | ORAL | Status: DC | PRN

## 2021-01-11 MED ORDER — MORPHINE SULFATE (PF) 2 MG/ML IV SOLN
2.0000 mg | INTRAVENOUS | Status: DC | PRN
Start: 2021-01-11 — End: 2021-01-15
  Administered 2021-01-12 – 2021-01-15 (×6): 2 mg via INTRAVENOUS
  Filled 2021-01-11 (×6): qty 1

## 2021-01-11 MED ORDER — LORAZEPAM 2 MG/ML PO CONC: 1.0000 mg | ORAL | Status: DC | PRN

## 2021-01-11 MED ORDER — ACETAMINOPHEN 325 MG PO TABS: 650.0000 mg | ORAL_TABLET | ORAL | Status: DC | PRN

## 2021-01-11 MED ORDER — GLYCOPYRROLATE 0.2 MG/ML IJ SOLN
0.2000 mg | INTRAMUSCULAR | Status: DC | PRN
  Administered 2021-01-12 – 2021-01-14 (×3): 0.2 mg via INTRAVENOUS

## 2021-01-11 MED ORDER — ACETAMINOPHEN 650 MG RE SUPP: 650.0000 mg | RECTAL | Status: DC | PRN

## 2021-01-11 MED ORDER — POLYVINYL ALCOHOL 1.4 % OP SOLN: 1.0000 [drp] | Freq: Four times a day (QID) | OPHTHALMIC | Status: DC | PRN

## 2021-01-11 MED ORDER — ONDANSETRON 4 MG PO TBDP: 4.0000 mg | ORAL_TABLET | Freq: Four times a day (QID) | ORAL | Status: DC | PRN

## 2021-01-11 MED ORDER — HALOPERIDOL LACTATE 5 MG/ML IJ SOLN: 0.5000 mg | INTRAMUSCULAR | Status: DC | PRN

## 2021-01-11 MED ORDER — HYDRALAZINE HCL 20 MG/ML IJ SOLN: 5.0000 mg | Freq: Four times a day (QID) | INTRAMUSCULAR | Status: DC | PRN

## 2021-01-11 MED ORDER — ASPIRIN 300 MG RE SUPP
300.0000 mg | Freq: Every day | RECTAL | Status: DC
  Administered 2021-01-11: 300 mg via RECTAL
  Filled 2021-01-11: qty 1

## 2021-01-11 NOTE — TOC Initial Note (Signed)
Transition of Care [facility]) - Initial/Assessment Note    Patient Details  Name: Ralph Mendoza MRN: [id number] Date of Birth: [birth year]  Transition of Care [facility]) CM/SW Contact:    Verdell Carmine, RN Phone Number: 12/22/2020, 6:09 PM  Clinical Narrative:                  22 ear old patient found sup[place] His wheelchair beside him.  Has had CVA, last well last evening. Daughter and son in law at bedside. Called [facility] [place] about information on residential hospice. She is seeing how "things go" with her father and will think about the different facilities Facilities given [facility], [facility]. They prefer to stay in the [place] area.   CM/CSW will follow for needs, recommendations, and transitions.  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, Materials engineer Lives with:: Self Patient language and need for interpreter reviewed:: Yes        Need for Family Participation in Patient Care: Yes (Comment) Care giver support system in place?: Yes (comment) Current home services: DME Administrator, Civil Service) Criminal Activity/Legal Involvement Pertinent to Current Situation/Hospitalization: No - Comment as needed  Activities of Daily Living      Permission Sought/Granted                  Emotional Assessment       Orientation: : Fluctuating Orientation (Suspected and/or reported Sundowners) Alcohol / Substance Use: Not Applicable Psych  Involvement: No (comment)  Admission diagnosis:  CVA (cerebral vascular accident) ([place]) [I63.9] Patient Active Problem List   Diagnosis Date Noted   CVA (cerebral vascular accident) ([place]) 01/12/2021   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   Hyperbilirubinemia 03/01/2019   Goals of care, counseling/discussion 03/01/2019   Diffuse large b-cell lymphoma, lymph nodes of head, face, and neck ([place]) 02/27/2019   Lobar pneumonia ([place]) 05/27/2017   Thrombocytopenia ([place]) 05/27/2017   Dehydration 03/47/4259   Chronic systolic CHF (congestive heart failure) ([place]) 05/27/2017   Recurrent right pleural effusion 09/27/2016   CKD (chronic kidney disease) stage 3, GFR 30-59 ml/min ([place]) 09/27/2016   Normocytic anemia 09/27/2016   Atrial fibrillation ([place]) 05/17/2012   Essential hypertension 09/10/2008   PCP:  [name], MD Pharmacy:   [id number] [place], [place] [address] Phone: [phone] Fax: [phone]  [facility]  ([place], [place] [facility] [address] [place] [address] Phone: [phone] Fax: [phone]     Social Determinants of Health (SDOH) Interventions    Readmission Risk Interventions No flowsheet data found.

## 2021-01-11 NOTE — ED Notes (Signed)
Y remains at bedside. No needs expressed at this time.

## 2021-01-11 NOTE — Consult Note (Signed)
Consultation Note Date: 12/21/2020   Patient Name: Ralph Mendoza  DOB: 11-20-1920  MRN: [id number]  Age / Sex: 87 y.o., male  PCP: [name], MD Referring Physician: Lequita Halt, MD  Reason for Consultation: Establishing goals of care, "Massive stroke, unlikely will recover."  HPI/Patient Profile: 87 y.o. male  with past medical history of  chronic A. 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 CT showed multiple strokes that Neurology felt would be disabling - patient would no longer be able to live independently. Patient was admitted on 12/23/2020 with right sided MCA, ACA embolic stroke.   ED Course: No tachycardia or hypotension, EKG showed chronic A. fib.  CT head showed multiple embolic infarcts left MCA and ACA territories.  Blood work WBC 10.8, hemoglobin 12.7, sodium 134, BUN 29, creatinine 1.7  Clinical Assessment and Goals of Care: I have reviewed medical records including EPIC notes, labs, and imaging. Received report from primary RN - no acute concerns.   Went to visit patient at bedside - son in law/Steve and daughter/Cathy were present. Patient was lying in bed - he does not wake to voice/gentle touch. He does have signs and non-verbal gestures of discomfort noted, including restlessness. No respiratory distress, increased work of breathing, or secretions noted.   Met with Ellene Route, and their daughter/patient's granddaughter/Christy who arrived as we entered ED conference room to discuss diagnosis, prognosis, GOC, EOL wishes, disposition, and options.  I introduced Palliative Medicine as specialized medical care for people living with serious illness. It focuses on  providing relief from the symptoms and stress of a serious illness. The goal is to improve quality of life for both the patient and the family.  We discussed a brief life review of the patient as well as functional and nutritional status. Patient's wife passed away about 10 years ago - they had two daughters together. Prior to hospitalization, patient was living at [facility] independent living. 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 At that time, patient was able to go with family out to eat/shopping/visit his wife's grave site; however, as of several weeks ago he no longer is able to do these things. Family explain they have noticed him become increasingly sleepy and has had trouble walking. Patient had a [place] aid assist in ADLs 3 hrs per day 2 days per week - family felt he needed more care but he refused. Family state patient's appetite has been poor.   We discussed patient's current illness and what it means in the larger context of patient's on-going co-morbidities. Reviewed Neurology's input on patient's expected recovery. Reviewed that aggressive work up would unlikely improve his short or long term prognosis. Natural disease trajectory and expectations at EOL were discussed.  I attempted to elicit values and goals of care important to the patient. The difference between aggressive medical intervention and comfort care was considered in light of the patient's goals of care.   Per family's request, discussed the difference between Palliative and Hospice care. Palliative care and hospice have similar goals of managing symptoms, promoting comfort, improving quality of life, and maintaining a person's dignity. However, palliative care may be offered during any phase of a serious illness, while hospice care is usually offered when a person is expected to live for 6 months or less.  Family are  clear they want to keep the patient comfortable. They do not want him to suffer. They recognize he valued his independence and likely would not return to previous level of functioning allowing independence. We talked about transition to comfort measures in house and what that would entail inclusive of medications to control pain, dyspnea, agitation, nausea, itching, and hiccups. We discussed stopping all unnecessary measures such as blood draws, needle sticks, oxygen, antibiotics, CBGs/insulin, cardiac monitoring, IVF, and frequent vital signs. Family are agreeable for transition to full comfort care in house today. Patient has told family recently that he is "ready to go be with his wife."  Provided education and counseling at length on the philosophy and benefits of hospice care. Discussed that it offers a holistic approach to care in the setting of end-stage illness, and is about supporting the patient where they are allowing nature to take it's course. Discussed the hospice team includes RNs, physicians, social workers, and chaplains. They can provide personal care, support for the family, and help keep patient out of the hospital as well as assist with DME needs for home hospice. Education provided on the difference between home vs residential hospice. Family requested a list of residential hospice facilities around this area so they can decide which one to utilize. They would like the patient to remain in house tonight while they get that information and decide. Provided reassurance that residential hospice referral could be cancelled (would anticipate hospital death) at any time if patient's condition changed and it was felt they were too unstable for transfer.  Advance directives and concepts specific to code status were considered and discussed. Daughter/Cathy Cowing is patient's HCPOA - requested copy. Family confirm DNR/DNI status.   Current [place] [id number] visitation policy was  reviewed.  Visit also consisted of discussions dealing with the complex and emotionally intense issues of symptom management and palliative care in the setting of serious and potentially life-threatening illness. Palliative care team will continue to support patient, patient's family, and medical team.  Discussed with family the importance of continued conversation with each other and the medical providers regarding overall plan of care and treatment options, ensuring decisions are within the context of the patient's values and GOCs.    Questions and concerns were addressed. The patient/family was encouraged to call with questions and/or concerns. PMT card was provided.  Per current COVID policy - discussed with primary RN appropriate number of visitors. Family requested 6 - both I and RN agreed to 6 at a time.  Primary Decision Maker: NEXT OF KIN/HCPOA/daughter - Cathy Cowhig    SUMMARY OF RECOMMENDATIONS   Initiated full comfort measures Continue DNR/DNI as previously documented Family request patient remain in house tonight while they decide on which residential hospice facility they would like to utilize. If patient becomes too unstable for transfer, would recommend he remain in house for EOL care TOC notified and consulted for:  family's request for list of residential hospice facilities around this area Added orders for EOL symptom management and to reflect full comfort measures, as well as discontinued orders that were not focused on comfort Unrestricted visitation orders were placed per current [facility] EOL visitation policy  Nursing to provide frequent assessments and administer PRN medications as clinically necessary to ensure EOL comfort PMT will continue to follow and support holistically   Code Status/Advance Care Planning: DNR  Palliative Prophylaxis:  Aspiration, Bowel Regimen, Delirium Protocol, Frequent Pain Assessment, Oral Care, and Turn  Reposition  Additional Recommendations (Limitations, Scope, Preferences): Full Comfort Care  Psycho-social/Spiritual:  Desire for further Chaplaincy support:no Created space and opportunity for patient and family to express thoughts and feelings regarding patient's current medical situation.  Emotional support and therapeutic listening provided.  Prognosis:  Hours - Days  Discharge Planning: residential hospice facility vs hospital death      Primary Diagnoses: Present on Admission: **None**   I have reviewed the medical record, interviewed the patient and family, and examined the patient. The following aspects are pertinent.  Past Medical History:  Diagnosis Date   Atrial fibrillation (HCC)    BPH (benign prostatic hyperplasia)    Dyspnea on exertion    a. 09/2008 NL EF on myoview.   Hearing difficulty    R sided hole in eardrum, only wears L hearing aid   History of tobacco abuse    Hypertension    Lumbar stenosis with neurogenic claudication    a. 02/2011 s/p decompressive laminectomy.   Midsternal chest pain    a. 09/2008 Myoview: EF 56%, mild fixed thinning of inf wall particularly @ base -? attenuation.  No ischemia   PUD (peptic ulcer disease)    Remote, history of   Urinary hesitancy    Social History   Socioeconomic History   Marital status: Widowed    Spouse name: Not on file   Number of children: 2   Years of education: Not on file   Highest education level: Not on file  Occupational History   Occupation: Runner, broadcasting/film/video    Comment: Retired  Tobacco Use   Smoking status: Former   Smokeless tobacco: Never   Tobacco comments:    Remote history of smoking  Vaping Use   Vaping Use: Never used  Substance and Sexual Activity   Alcohol use: No   Drug use: No   Sexual activity: Not on file  Other Topics Concern   Not on file  Social History Narrative   Lives with his wife in [place].   He swims daily.   He has 2 grown daughters.   1  Caffeine drink daily    Social Determinants of Health   Financial Resource Strain: Not on file  Food Insecurity: Not on file  Transportation Needs: Not on file  Physical Activity: Not on file  Stress: Not on file  Social Connections: Not on file   Family History  Problem Relation Age of Onset   Lung cancer Mother    Appendicitis Father    Diabetes Sister        DM   Coronary artery disease Neg Hx        No premature CAD   Colon cancer Neg Hx    Scheduled Meds:  aspirin  300 mg Rectal Daily   heparin  5,000 Units Subcutaneous Q12H   pantoprazole (PROTONIX) IV  40 mg Intravenous Q24H   Continuous Infusions:  sodium chloride 100 mL/hr at  01/02/2021 1413   PRN Meds:.acetaminophen **OR** acetaminophen (TYLENOL) oral liquid 160 mg/5 mL **OR** acetaminophen, bisacodyl, hydrALAZINE Medications Prior to Admission:  Prior to Admission medications   Medication Sig Start Date End Date Taking? Authorizing Provider  acetaminophen (TYLENOL) 500 MG tablet Take 1,000 mg by mouth every 6 (six) hours as needed for mild pain or moderate pain.    [provider]  apixaban (ELIQUIS) 2.5 MG TABS tablet TAKE 1 TABLET BY MOUTH  TWICE DAILY 11/22/20   Lelon Perla, MD  Dextromethorphan-Guaifenesin (DELSYM COUGH/CHEST CONGEST DM PO) Take 10 mLs by mouth daily as needed (for cough).     [provider]  furosemide (LASIX) 20 MG tablet TAKE 2 TABLETS BY MOUTH  DAILY 08/29/20   Lelon Perla, MD  isosorbide dinitrate (ISORDIL) 30 MG tablet TAKE 1 TABLET BY MOUTH  DAILY 06/03/20   Lelon Perla, MD  losartan (COZAAR) 25 MG tablet TAKE 1 TABLET BY MOUTH  DAILY 11/04/20   Lelon Perla, MD  Multiple Vitamins-Minerals (PRESERVISION AREDS 2+MULTI VIT) CAPS Take 1 capsule by mouth daily.    [provider]  oxyCODONE (OXY IR/ROXICODONE) 5 MG immediate release tablet Take 5 mg by mouth 4 (four) times daily as needed. 07/12/19   [provider]  potassium chloride SA  (KLOR-CON) 20 MEQ tablet TAKE 1 TABLET BY MOUTH  DAILY 06/03/20   Lelon Perla, MD   No Known Allergies Review of Systems  Unable to perform ROS: Acuity of condition   Physical Exam Vitals and nursing note reviewed.  Constitutional:      General: He is not in acute distress.    Appearance: He is cachectic. He is ill-appearing.  Pulmonary:     Effort: No respiratory distress.  Skin:    General: Skin is cool and dry.     Findings: Bruising present.  Neurological:     Mental Status: He is unresponsive.     Motor: Weakness present.  Psychiatric:        Speech: He is noncommunicative.    Vital Signs: BP 113/79   Pulse 61   Temp 97.7 F (36.5 C) (Axillary)   Resp 13   SpO2 100%  Pain Scale: 0-10   Pain Score: 0-No pain   SpO2: SpO2: 100 % O2 Device:SpO2: 100 % O2 Flow Rate: .   IO: Intake/output summary:  Intake/Output Summary (Last 24 hours) at 12/19/2020 1559 Last data filed at 12/20/2020 1539 Gross per 24 hour  Intake 500 ml  Output --  Net 500 ml    LBM:   Baseline Weight:   Most recent weight:       Palliative Assessment/Data: PPS 10%     Time In: 1645 Time Out: 1815 Time Total: 90 minutes  Greater than 50%  of this time was spent counseling and coordinating care related to the above assessment and plan.  Signed by: Lin Landsman, NP   Please contact Palliative Medicine Team phone at [phone] for questions and concerns.  For individual provider: See Shea Evans

## 2021-01-11 NOTE — ED Triage Notes (Signed)
Pt BIB [facility] from [place]. Per staff pt last seen last night 1900, found pt laying supine beside wheelchair, presumed fall. Pt is on Eliquis for afib, pt alert but nonresponsive to questions. Family states this is abnormal.

## 2021-01-11 NOTE — ED Provider Notes (Signed)
[facility] EMERGENCY DEPARTMENT Provider Note   CSN: [id number] Arrival date & time: 01/08/2021  1027     History Chief Complaint  Patient presents with   Ralph Mendoza is a 87 y.o. male.  87 year old male with prior medical history as detailed below presents for evaluation.  Patient was last seen normal at [facility] around 7 PM yesterday.  Patient was found supine on the floor of his room next to his wheelchair this a.m.  Patient is unresponsive per EMS.  Patient is unable to answer questions.  Level 5 caveat secondary to same.  The history is provided by the patient and the EMS personnel.  Illness Location:  Found unresponsive on floor, last known well 1900 Severity:  Severe Onset quality:  Sudden Timing:  Unable to specify Progression:  Unable to specify     Past Medical History:  Diagnosis Date   Atrial fibrillation (HCC)    BPH (benign prostatic hyperplasia)    Dyspnea on exertion    a. 09/2008 NL EF on myoview.   Hearing difficulty    R sided hole in eardrum, only wears L hearing aid   History of tobacco abuse    Hypertension    Lumbar stenosis with neurogenic claudication    a. 02/2011 s/p decompressive laminectomy.   Midsternal chest pain    a. 09/2008 Myoview: EF 56%, mild fixed thinning of inf wall particularly @ base -? attenuation.  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   Hyperbilirubinemia 03/01/2019   Goals of care, counseling/discussion 03/01/2019   Diffuse large b-cell lymphoma, lymph nodes of head, face, and neck ([place]) 02/27/2019   Lobar pneumonia ([place]) 05/27/2017   Thrombocytopenia ([place]) 05/27/2017   Dehydration 96/29/5284   Chronic systolic CHF (congestive heart failure) ([place]) 05/27/2017   Recurrent right pleural effusion 09/27/2016   CKD (chronic kidney disease)  stage 3, GFR 30-59 ml/min (HCC) 09/27/2016   Normocytic anemia 09/27/2016   Atrial fibrillation ([place]) 05/17/2012   Essential hypertension 09/10/2008    Past Surgical History:  Procedure Laterality Date   HERNIA REPAIR     Left   LUMBAR LAMINECTOMY/DECOMPRESSION MICRODISCECTOMY  03/04/2011   Procedure: LUMBAR LAMINECTOMY/DECOMPRESSION MICRODISCECTOMY;  Surgeon: Ophelia Charter;  Location: [facility];  Service: Neurosurgery;  Laterality: N/A;  LUMBAR THREE-FOUR ,FOUR-FIVE LAMINECTOMIES   TYMPANOMASTOIDECTOMY     Left modified radical with type 3 tympanoplasty       Family History  Problem Relation Age of Onset   Lung cancer Mother    Appendicitis Father    Diabetes Sister        DM   Coronary artery disease Neg Hx        No premature CAD   Colon cancer Neg Hx     Social History   Tobacco Use   Smoking status: Former   Smokeless tobacco: Never   Tobacco comments:    Remote history of smoking  Vaping Use   Vaping Use: Never used  Substance Use Topics   Alcohol use: No   Drug use: No    Home Medications Prior to Admission medications   Medication Sig Start Date End Date Taking? Authorizing Provider  acetaminophen (TYLENOL) 500 MG tablet Take 1,000 mg by mouth every 6 (six) hours as needed for mild  pain or moderate pain.    [provider]  apixaban (ELIQUIS) 2.5 MG TABS tablet TAKE 1 TABLET BY MOUTH  TWICE DAILY 11/22/20   Lelon Perla, MD  Dextromethorphan-Guaifenesin (DELSYM COUGH/CHEST CONGEST DM PO) Take 10 mLs by mouth daily as needed (for cough).     [provider]  furosemide (LASIX) 20 MG tablet TAKE 2 TABLETS BY MOUTH  DAILY 08/29/20   Lelon Perla, MD  isosorbide dinitrate (ISORDIL) 30 MG tablet TAKE 1 TABLET BY MOUTH  DAILY 06/03/20   Lelon Perla, MD  losartan (COZAAR) 25 MG tablet TAKE 1 TABLET BY MOUTH  DAILY 11/04/20   Lelon Perla, MD  Multiple Vitamins-Minerals (PRESERVISION AREDS 2+MULTI VIT) CAPS Take 1 capsule by  mouth daily.    [provider]  oxyCODONE (OXY IR/ROXICODONE) 5 MG immediate release tablet Take 5 mg by mouth 4 (four) times daily as needed. 07/12/19   [provider]  potassium chloride SA (KLOR-CON) 20 MEQ tablet TAKE 1 TABLET BY MOUTH  DAILY 06/03/20   Lelon Perla, MD    Allergies    Patient has no known allergies.  Review of Systems   Review of Systems  Unable to perform ROS: Acuity of condition   Physical Exam Updated Vital Signs BP 130/63   Pulse (!) 53   Temp 97.7 F (36.5 C) (Axillary)   Resp 14   SpO2 100%   Physical Exam Vitals and nursing note reviewed.  Constitutional:      General: He is not in acute distress.    Appearance: He is well-developed.     Comments: Unresponsive to verbal stimulus  Right facial droop  Gaze is fixed toward the ceiling  Appears to be protecting airway  Left upper extremity with minimal movement. LLE with minimal movement.  Withdraws right arm and right leg to painful stimulus.  HENT:     Head: Normocephalic and atraumatic.  Eyes:     Conjunctiva/sclera: Conjunctivae normal.     Pupils: Pupils are equal, round, and reactive to light.  Cardiovascular:     Rate and Rhythm: Normal rate and regular rhythm.     Heart sounds: Normal heart sounds.  Pulmonary:     Effort: Pulmonary effort is normal. No respiratory distress.     Breath sounds: Normal breath sounds.  Abdominal:     General: There is no distension.     Palpations: Abdomen is soft.     Tenderness: There is no abdominal tenderness.  Musculoskeletal:        General: No deformity. Normal range of motion.     Cervical back: Normal range of motion and neck supple.  Skin:    General: Skin is warm and dry.    ED Results / Procedures / Treatments   Labs (all labs ordered are listed, but only abnormal results are displayed) Labs Reviewed  CBC WITH DIFFERENTIAL/PLATELET - Abnormal; Notable for the following components:      Result Value   RBC  4.03 (*)    Hemoglobin 12.7 (*)    HCT 38.6 (*)    All other components within normal limits  CBG MONITORING, ED - Abnormal; Notable for the following components:   Glucose-Capillary 134 (*)    All other components within normal limits  RESP PANEL BY RT-PCR (FLU A&B, COVID) ARPGX2  PROTIME-INR  LACTIC ACID, PLASMA  LACTIC ACID, PLASMA  COMPREHENSIVE METABOLIC PANEL  URINALYSIS, ROUTINE W REFLEX MICROSCOPIC  CK  TYPE AND SCREEN  TROPONIN I (HIGH SENSITIVITY)  TROPONIN I (HIGH SENSITIVITY)    EKG EKG Interpretation  Date/Time:  Saturday January 11 2021 11:06:58 EDT Ventricular Rate:  132 PR Interval:    QRS Duration: 116 QT Interval:  366 QTC Calculation: 532 R Axis:   268 Text Interpretation: Poor baseline Left anterior fascicular block Abnormal lateral Q waves Artifact in lead(s) I III aVR aVL aVF V1 V2 V4 V5 V6 Confirmed by Dene Gentry [phone]) on 01/10/2021 11:32:43 AM  Radiology DG Pelvis 1-2 Views  Result Date: 01/07/2021 CLINICAL DATA:  fall EXAM: PELVIS - 1-2 VIEW COMPARISON:  April 27, 2015 FINDINGS: Osteopenia. No definitive displaced fracture visualized. Moderate degenerative changes with rim osteophyte formation of bilateral hips limits evaluation for subtle fractures. Degenerative changes of the lower lumbar spine. Pelvic phleboliths. IMPRESSION: No acute displaced fracture visualized on this single AP view. If persistent concern for nondisplaced fractures, recommend dedicated additional views and/or cross-sectional imaging. Electronically Signed   By: Valentino Saxon M.D.   On: 01/16/2021 11:31   CT Head Wo Contrast  Result Date: 01/10/2021 CLINICAL DATA:  Patient fell while changing a light bulb. Patient hit the back of his head. EXAM: CT HEAD WITHOUT CONTRAST CT CERVICAL SPINE WITHOUT CONTRAST TECHNIQUE: Multidetector CT imaging of the head and cervical spine was performed following the standard protocol without intravenous contrast. Multiplanar CT image  reconstructions of the cervical spine were also generated. COMPARISON:  04/27/2015 FINDINGS: CT HEAD FINDINGS Brain: Multiple patchy areas hypoattenuation are noted involving a significant portion of the right MCA and ACA territories, with multiple areas of loss of the normal gray-white junction. Hypoattenuation also involves the right insular cortex. There is mass effect with right-sided sulcal effacement and slight midline shift to the left of 2-3 mm. These findings are new from the prior CT. Ventricles are normal in size and configuration. No hydrocephalus. There is mild effacement of the right lateral ventricle. No parenchymal masses. Patchy areas of white matter hypoattenuation on the left are consistent with mild chronic microvascular ischemic change. There are no extra-axial masses or abnormal fluid collections. No intracranial hemorrhage. Vascular: No hyperdense vessel or unexpected calcification. Skull: Normal. Negative for fracture or focal lesion. Sinuses/Orbits: Globes and orbits are unremarkable. Previous left mastoidectomy. Clear right mastoid air cells. Mild ethmoid sinus mucosal thickening. Other: None. CT CERVICAL SPINE FINDINGS Alignment: Normal. Skull base and vertebrae: No acute fracture. No primary bone lesion or focal pathologic process. Soft tissues and spinal canal: No prevertebral fluid or swelling. No visible canal hematoma. Disc levels: Marked loss of disc height at C3-C4. Mild loss of disc height at C6-C7. Mild facet degenerative changes on the right most notably at C5-C6. No convincing disc herniation. Upper chest: No acute findings. Other: Dense carotid atherosclerotic calcifications at the bifurcations and proximal internal carotid arteries. IMPRESSION: HEAD CT 1. Multiple areas of hypoattenuation along the right cerebral hemisphere, involving the gray-white junction, and involving a significant portion of the right MCA and ACA vascular territories. Findings are consistent with recent  infarction. 2. No intracranial hemorrhage. No findings consistent with intracranial trauma. CERVICAL CT 1. No fracture or acute finding. Critical Value/emergent results were called by telephone at the time of interpretation on 01/09/2021 at 12:12 pm to provider [facility] , who verbally acknowledged these results. Electronically Signed   By: Lajean Manes M.D.   On: 01/14/2021 12:09   CT Cervical Spine Wo Contrast  Result Date: 01/04/2021 CLINICAL DATA:  Patient fell while changing a light bulb. Patient hit  the back of his head. EXAM: CT HEAD WITHOUT CONTRAST CT CERVICAL SPINE WITHOUT CONTRAST TECHNIQUE: Multidetector CT imaging of the head and cervical spine was performed following the standard protocol without intravenous contrast. Multiplanar CT image reconstructions of the cervical spine were also generated. COMPARISON:  04/27/2015 FINDINGS: CT HEAD FINDINGS Brain: Multiple patchy areas hypoattenuation are noted involving a significant portion of the right MCA and ACA territories, with multiple areas of loss of the normal gray-white junction. Hypoattenuation also involves the right insular cortex. There is mass effect with right-sided sulcal effacement and slight midline shift to the left of 2-3 mm. These findings are new from the prior CT. Ventricles are normal in size and configuration. No hydrocephalus. There is mild effacement of the right lateral ventricle. No parenchymal masses. Patchy areas of white matter hypoattenuation on the left are consistent with mild chronic microvascular ischemic change. There are no extra-axial masses or abnormal fluid collections. No intracranial hemorrhage. Vascular: No hyperdense vessel or unexpected calcification. Skull: Normal. Negative for fracture or focal lesion. Sinuses/Orbits: Globes and orbits are unremarkable. Previous left mastoidectomy. Clear right mastoid air cells. Mild ethmoid sinus mucosal thickening. Other: None. CT CERVICAL SPINE FINDINGS Alignment:  Normal. Skull base and vertebrae: No acute fracture. No primary bone lesion or focal pathologic process. Soft tissues and spinal canal: No prevertebral fluid or swelling. No visible canal hematoma. Disc levels: Marked loss of disc height at C3-C4. Mild loss of disc height at C6-C7. Mild facet degenerative changes on the right most notably at C5-C6. No convincing disc herniation. Upper chest: No acute findings. Other: Dense carotid atherosclerotic calcifications at the bifurcations and proximal internal carotid arteries. IMPRESSION: HEAD CT 1. Multiple areas of hypoattenuation along the right cerebral hemisphere, involving the gray-white junction, and involving a significant portion of the right MCA and ACA vascular territories. Findings are consistent with recent infarction. 2. No intracranial hemorrhage. No findings consistent with intracranial trauma. CERVICAL CT 1. No fracture or acute finding. Critical Value/emergent results were called by telephone at the time of interpretation on 01/14/2021 at 12:12 pm to provider [facility] , who verbally acknowledged these results. Electronically Signed   By: Lajean Manes M.D.   On: 12/31/2020 12:09   DG Chest Port 1 View  Result Date: 01/02/2021 CLINICAL DATA:  AMS, presumed fall EXAM: PORTABLE CHEST 1 VIEW COMPARISON:  December 20, 2020 FINDINGS: The cardiomediastinal silhouette is unchanged and enlarged in contour.Atherosclerotic calcifications of the aorta. No large pleural effusion; incomplete assessment of the RIGHT costophrenic angle. No pneumothorax. New bandlike opacity of the RIGHT mid lung. Visualized abdomen is unremarkable. Multilevel degenerative changes of the thoracic spine. IMPRESSION: New band like opacity of the RIGHT mid lung, likely atelectasis. Superimposed infection could present similarly. Electronically Signed   By: Valentino Saxon M.D.   On: 12/22/2020 11:28    Procedures Procedures   Medications Ordered in ED Medications - No data  to display  ED Course  I have reviewed the triage vital signs and the nursing notes.  Pertinent labs & imaging results that were available during my care of the patient were reviewed by me and considered in my medical decision making (see chart for details).    MDM Rules/Calculators/A&P                           MDM  MSE complete  JULIAS MOULD was evaluated in Emergency Department on 12/31/2020 for the symptoms described in the history of  present illness. He was evaluated in the context of the global COVID-19 pandemic, which necessitated consideration that the patient might be at risk for infection with the SARS-CoV-2 virus that causes COVID-19. Institutional protocols and algorithms that pertain to the evaluation of patients at risk for COVID-19 are in a state of rapid change based on information released by regulatory bodies including the CDC and federal and state organizations. These policies and algorithms were followed during the patient's care in the ED.  Patient is presenting with alteration in mental status.  Exam is suggestive of likely CVA.  Patient's last known normal was 40 yesterday.  He is outside the window for lytics or acute intervention.  Patient's son-in-law is at bedside.  Discussion with son-in-law and patient's daughter confirms patient is DNR/DNI status.  Initial CT scan is suggestive of multiple right-sided infarcts.  Patient would benefit from admission for further work-up and treatment.  Neurology is aware of case and will consult.  Hospitalist service aware of case.    Final Clinical Impression(s) / ED Diagnoses Final diagnoses:  Cerebrovascular accident (CVA), unspecified mechanism ([place])    Rx / DC Orders ED Discharge Orders     None        Valarie Merino, MD 01/07/2021 1248

## 2021-01-11 NOTE — Consult Note (Signed)
Neurology Consultation  Reason for Consult: Stroke Referring Physician: Dr. Dene Gentry  CC: Fall, left-sided weakness  History is obtained from: Chart, patient's son-in-law at bedside  HPI: Ralph Mendoza is a 87 y.o. male past medical of atrial fibrillation on Eliquis, extremely hard of hearing, hypertension, peptic ulcer disease, who lives at [facility] and ambulated with a wheelchair at baseline, was found down at his place of living and brought in for further evaluation. His mental status also was altered-he is very hard of hearing and extremely difficult to communicate at this time with. He was noted to have some selective left-sided weakness. CT head was obtained that showed evolving infarctions in the right MCA and ACA territory. Neurological consultation was obtained for the strokes. Patient's son-in-law was at bedside when I saw him.  Family had been with him yesterday between 3 and 4 PM when they last saw him.  His dinner was delivered around 5 p.m. and was not outside making them presumed that he had picked up his dinner.  That is the best last known normal that we have-5 PM on 01/10/2021. In the morning, he was found down and EMS was called.    LKW: 5 PM on 01/10/2021 tpa given?: no, outside the window Premorbid modified Rankin scale (mRS): 3  ROS: Unable to obtain due to altered mental status.   Past Medical History:  Diagnosis Date   Atrial fibrillation (HCC)    BPH (benign prostatic hyperplasia)    Dyspnea on exertion    a. 09/2008 NL EF on myoview.   Hearing difficulty    R sided hole in eardrum, only wears L hearing aid   History of tobacco abuse    Hypertension    Lumbar stenosis with neurogenic claudication    a. 02/2011 s/p decompressive laminectomy.   Midsternal chest pain    a. 09/2008 Myoview: EF 56%, mild fixed thinning of inf wall particularly @ base -? attenuation.  No ischemia   PUD (peptic ulcer disease)    Remote, history of    Urinary hesitancy    Family History  Problem Relation Age of Onset   Lung cancer Mother    Appendicitis Father    Diabetes Sister        DM   Coronary artery disease Neg Hx        No premature CAD   Colon cancer Neg Hx     Social History:   reports that he has quit smoking. He has never used smokeless tobacco. He reports that he does not drink alcohol and does not use drugs.  Medications  Current Facility-Administered Medications:     stroke: mapping our early stages of recovery book, , Does not apply, Once, Wynetta Fines T, MD   0.9 %  sodium chloride infusion, , Intravenous, Continuous, Wynetta Fines T, MD, Last Rate: 100 mL/hr at 01/14/2021 1413, New Bag at 01/10/2021 1413   acetaminophen (TYLENOL) tablet 650 mg, 650 mg, Oral, Q4H PRN **OR** acetaminophen (TYLENOL) 160 MG/5ML solution 650 mg, 650 mg, Per Tube, Q4H PRN **OR** acetaminophen (TYLENOL) suppository 650 mg, 650 mg, Rectal, Q4H PRN, Wynetta Fines T, MD   aspirin suppository 300 mg, 300 mg, Rectal, Daily, Wynetta Fines T, MD   bisacodyl (DULCOLAX) suppository 10 mg, 10 mg, Rectal, Daily PRN, Wynetta Fines T, MD   heparin injection 5,000 Units, 5,000 Units, Subcutaneous, Q12H, Zhang, Ping T, MD   hydrALAZINE (APRESOLINE) injection 5 mg, 5 mg, Intravenous, Q6H PRN, Lequita Halt, MD  pantoprazole (PROTONIX) injection 40 mg, 40 mg, Intravenous, Q24H, Zhang, Ralene Cork, MD  Current Outpatient Medications:    acetaminophen (TYLENOL) 500 MG tablet, Take 1,000 mg by mouth every 6 (six) hours as needed for mild pain or moderate pain., Disp: , Rfl:    apixaban (ELIQUIS) 2.5 MG TABS tablet, TAKE 1 TABLET BY MOUTH  TWICE DAILY, Disp: 180 tablet, Rfl: 2   Dextromethorphan-Guaifenesin (DELSYM COUGH/CHEST CONGEST DM PO), Take 10 mLs by mouth daily as needed (for cough). , Disp: , Rfl:    furosemide (LASIX) 20 MG tablet, TAKE 2 TABLETS BY MOUTH  DAILY, Disp: 180 tablet, Rfl: 3   isosorbide dinitrate (ISORDIL) 30 MG tablet, TAKE 1 TABLET BY MOUTH   DAILY, Disp: 90 tablet, Rfl: 3   losartan (COZAAR) 25 MG tablet, TAKE 1 TABLET BY MOUTH  DAILY, Disp: 90 tablet, Rfl: 3   Multiple Vitamins-Minerals (PRESERVISION AREDS 2+MULTI VIT) CAPS, Take 1 capsule by mouth daily., Disp: , Rfl:    oxyCODONE (OXY IR/ROXICODONE) 5 MG immediate release tablet, Take 5 mg by mouth 4 (four) times daily as needed., Disp: , Rfl:    potassium chloride SA (KLOR-CON) 20 MEQ tablet, TAKE 1 TABLET BY MOUTH  DAILY, Disp: 90 tablet, Rfl: 3  Exam: Current vital signs: BP 108/74   Pulse 64   Temp 97.7 F (36.5 C) (Axillary)   Resp 14   SpO2 100%  Vital signs in last 24 hours: Temp:  [97.7 F (36.5 C)] 97.7 F (36.5 C) (09/24 1051) Pulse Rate:  [53-81] 64 (09/24 1330) Resp:  [12-18] 14 (09/24 1330) BP: (108-137)/(52-74) 108/74 (09/24 1330) SpO2:  [95 %-100 %] 100 % (09/24 1330)  Extremely difficult to perform a good exam due to his extreme hard of hearing and strokes General: Lethargic looking man in no acute distress HEENT: Normocephalic/dried blood in the mouth CVS: Irregularly irregular Respiratory: Breathing well saturating normally on room air Neurological exam Very lethargic, eyes open but does not follow commands. Nonverbal Not sure if that is due to him not comprehending or him being extremely hard of hearing.  He also has diminished vision in both eyes. Does not blink to threat from either side Pupils are equal and sluggish bilaterally Face grossly symmetric To noxious stimulation, localizes briskly with the right upper extremity but the left upper extremity localization is very weak. To noxious stimulation, similar withdrawal in bilateral lower extremities. NIH stroke scale 1a Level of Conscious.: 1 1b LOC Questions: 2 1c LOC Commands: 2 2 Best Gaze: 0 3 Visual: 0 4 Facial Palsy: 0 5a Motor Arm - left: 1 5b Motor Arm - Right: 3 6a Motor Leg - Left: 2 6b Motor Leg - Right: 2 7 Limb Ataxia: 0 8 Sensory: 0 9 Best Language: 3 10  Dysarthria: 2 11 Extinct. and Inatten.: 0 TOTAL: 18  Labs I have reviewed labs in epic and the results pertinent to this consultation are:  CBC    Component Value Date/Time   WBC 7.8 12/25/2020 1122   RBC 4.03 (L) 12/23/2020 1122   HGB 12.7 (L) 12/22/2020 1122   HGB 12.0 (L) 09/25/2020 1520   HGB 12.3 (L) 03/04/2020 1113   HCT 38.6 (L) 12/27/2020 1122   HCT 37.4 (L) 03/04/2020 1113   PLT 193 12/20/2020 1122   PLT 164 09/25/2020 1520   PLT 180 03/04/2020 1113   MCV 95.8 12/22/2020 1122   MCV 95 03/04/2020 1113   MCH 31.5 12/23/2020 1122   MCHC 32.9 01/17/2021 1122  RDW 13.7 01/15/2021 1122   RDW 13.0 03/04/2020 1113   LYMPHSABS 0.8 01/08/2021 1122   LYMPHSABS 2.0 04/02/2017 1432   MONOABS 0.6 01/08/2021 1122   EOSABS 0.0 12/26/2020 1122   EOSABS 0.1 04/02/2017 1432   BASOSABS 0.0 12/28/2020 1122   BASOSABS 0.0 04/02/2017 1432    CMP     Component Value Date/Time   NA 134 (L) 01/03/2021 1122   NA 138 03/04/2020 1113   K 4.1 01/06/2021 1122   CL 98 01/09/2021 1122   CO2 22 01/05/2021 1122   GLUCOSE 137 (H) 12/20/2020 1122   BUN 29 (H) 01/10/2021 1122   BUN 31 03/04/2020 1113   CREATININE 1.77 (H) 01/06/2021 1122   CREATININE 1.70 (H) 09/25/2020 1520   CREATININE 1.25 (H) 01/31/2016 1341   CALCIUM 9.2 01/06/2021 1122   PROT 7.5 12/23/2020 1122   ALBUMIN 3.8 01/14/2021 1122   AST 37 12/20/2020 1122   AST 17 09/25/2020 1520   ALT 13 12/21/2020 1122   ALT 9 09/25/2020 1520   ALKPHOS 67 01/01/2021 1122   BILITOT 1.9 (H) 01/12/2021 1122   BILITOT 1.5 (H) 09/25/2020 1520   GFRNONAA 34 (L) 12/19/2020 1122   GFRNONAA 36 (L) 09/25/2020 1520   GFRNONAA 40 (L) 12/17/2015 1457   GFRAA 45 (L) 03/04/2020 1113   GFRAA 41 (L) 09/14/2019 1458   GFRAA 46 (L) 12/17/2015 1457     Imaging I have reviewed the images obtained: CT of the head with multiple hypodensities along the right MCA and right ACA territories consistent with subacute infarction.  Assessment:   87 year old man with above past medical history on Eliquis for atrial fibrillation presenting with unresponsiveness and found down on the ground.  On examination has left-sided weakness.  Very difficult exam due to him being extremely hard of hearing. CT head consistent with right ACA and MCA territory infarctions. Outside the window for IV tPA Given the already established hypodensities on the scan, I do not think he is a candidate for thrombectomy, also his poor MR S precludes him from thrombectomy. He would likely have considerable deficits on the left side of his body from the strokes.   Impression: Acute ischemic stroke-likely cardioembolic  Recommendations: Stroke work-up as below Admit to hospitalist Frequent neurochecks Telemetry Hold Eliquis for now due to minimize chances of hemorrhagic transformation. Okay to use aspirin in the interim CT head and neck 2D echo A1c Lipid panel PT OT Speech therapy I spoke with the son-in-law in detail and explained to him that this probably will be a disabling stroke to the point where he probably will not be able to live in independent living. Stroke team will follow the patient with you  -- Amie Portland, MD Neurologist [facility] Pager: [phone]

## 2021-01-11 NOTE — H&P (Signed)
History and Physical    Ralph Mendoza FIE:[id number] DOB: 1920-05-12 DOA: 01/03/2021  PCP: [name], MD (Confirm with patient/family/NH records and if not entered, this has to be entered at [facility] point of entry) Patient coming from: [facility]  I have personally briefly reviewed patient's old medical records in [place]  Chief Complaint: Patient nonverbal  HPI: Ralph Mendoza is a 87 y.o. male with medical history significant of chronic A. 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  Patient unable to provide any history, most history provided by patient's son-in-law at bedside and daughter over the phone.  Patient was last seen at his baseline around 7 PM last night.  This morning, patient was found on the floor next door to his wheelchair, unresponsive.  ED Course: No tachycardia or hypotension, EKG showed chronic A. fib.  CT head showed multiple embolic infarcts left MCA and ACA territories.  Blood work WBC 10.8, hemoglobin 12.7, sodium 134, BUN 29, creatinine 1.7  Review of Systems: Unable to obtain, patient confused not verbal.  Past Medical History:  Diagnosis Date   Atrial fibrillation (HCC)    BPH (benign prostatic hyperplasia)    Dyspnea on exertion    a. 09/2008 NL EF on myoview.   Hearing difficulty    R sided hole in eardrum, only wears L hearing aid   History of tobacco abuse    Hypertension    Lumbar stenosis with neurogenic claudication    a. 02/2011 s/p decompressive laminectomy.   Midsternal chest pain    a. 09/2008 Myoview: EF 56%, mild fixed thinning of inf wall particularly @ base -? attenuation.  No ischemia   PUD (peptic ulcer disease)    Remote, history of   Urinary hesitancy     Past Surgical History:  Procedure Laterality Date   HERNIA REPAIR     Left   LUMBAR  LAMINECTOMY/DECOMPRESSION MICRODISCECTOMY  03/04/2011   Procedure: LUMBAR LAMINECTOMY/DECOMPRESSION MICRODISCECTOMY;  Surgeon: Ophelia Charter;  Location: [facility];  Service: Neurosurgery;  Laterality: N/A;  LUMBAR THREE-FOUR ,FOUR-FIVE LAMINECTOMIES   TYMPANOMASTOIDECTOMY     Left modified radical with type 3 tympanoplasty     reports that he has quit smoking. He has never used smokeless tobacco. He reports that he does not drink alcohol and does not use drugs.  No Known Allergies  Family History  Problem Relation Age of Onset   Lung cancer Mother    Appendicitis Father    Diabetes Sister        DM   Coronary artery disease Neg Hx        No premature CAD   Colon cancer Neg Hx      Prior to Admission medications   Medication Sig Start Date End Date Taking? Authorizing Provider  acetaminophen (TYLENOL) 500 MG tablet Take 1,000 mg by mouth every 6 (six) hours as needed for mild pain or moderate pain.    [provider]  apixaban (ELIQUIS) 2.5 MG TABS tablet TAKE 1 TABLET BY MOUTH  TWICE DAILY 11/22/20   Lelon Perla, MD  Dextromethorphan-Guaifenesin (DELSYM COUGH/CHEST CONGEST DM PO) Take 10 mLs by mouth daily as needed (for cough).     [provider]  furosemide (LASIX) 20 MG tablet TAKE 2 TABLETS BY MOUTH  DAILY 08/29/20   Lelon Perla, MD  isosorbide dinitrate (ISORDIL) 30 MG tablet  TAKE 1 TABLET BY MOUTH  DAILY 06/03/20   Lelon Perla, MD  losartan (COZAAR) 25 MG tablet TAKE 1 TABLET BY MOUTH  DAILY 11/04/20   Lelon Perla, MD  Multiple Vitamins-Minerals (PRESERVISION AREDS 2+MULTI VIT) CAPS Take 1 capsule by mouth daily.    [provider]  oxyCODONE (OXY IR/ROXICODONE) 5 MG immediate release tablet Take 5 mg by mouth 4 (four) times daily as needed. 07/12/19   [provider]  potassium chloride SA (KLOR-CON) 20 MEQ tablet TAKE 1 TABLET BY MOUTH  DAILY 06/03/20   Lelon Perla, MD    Physical Exam: Vitals:   12/31/2020  1245 12/22/2020 1300 12/19/2020 1315 12/29/2020 1330  BP: (!) 125/59 (!) 137/58 (!) 137/55 108/74  Pulse: (!) 56 60 (!) 59 64  Resp: 15 15 16 14   Temp:      TempSrc:      SpO2: 100% 100% 100% 100%    Constitutional: NAD, calm, comfortable Vitals:   12/25/2020 1245 01/07/2021 1300 01/04/2021 1315 12/21/2020 1330  BP: (!) 125/59 (!) 137/58 (!) 137/55 108/74  Pulse: (!) 56 60 (!) 59 64  Resp: 15 15 16 14   Temp:      TempSrc:      SpO2: 100% 100% 100% 100%   Eyes: PERRL, lids and conjunctivae normal ENMT: Mucous membranes are dry. Posterior pharynx clear of any exudate or lesions.Normal dentition.  Neck: normal, supple, no masses, no thyromegaly Respiratory: clear to auscultation bilaterally, no wheezing, no crackles. Normal respiratory effort. No accessory muscle use.  Cardiovascular: Irregular heart rate, no murmurs / rubs / gallops. No extremity edema. 2+ pedal pulses. No carotid bruits.  Abdomen: no tenderness, no masses palpated. No hepatosplenomegaly. Bowel sounds positive.  Musculoskeletal: no clubbing / cyanosis. No joint deformity upper and lower extremities. Good ROM, no contractures. Normal muscle tone.  Skin: no rashes, lesions, ulcers. No induration Neurologic: No facial droops or tongue deviation, right-sided neglect, left-sided arm and leg flaccid.   Psychiatric: Opens eyes, confused.    Labs on Admission: I have personally reviewed following labs and imaging studies  CBC: Recent Labs  Lab 01/07/21 0626 01/16/2021 1122  WBC 7.5 7.8  NEUTROABS 5.7 6.4  HGB 11.3* 12.7*  HCT 34.4* 38.6*  MCV 93.2 95.8  PLT 171 347   Basic Metabolic Panel: Recent Labs  Lab 01/07/21 0626 01/03/2021 1122  NA 136 134*  K 4.0 4.1  CL 98 98  CO2 27 22  GLUCOSE 122* 137*  BUN 30* 29*  CREATININE 1.55* 1.77*  CALCIUM 9.5 9.2   GFR: CrCl cannot be calculated (Unknown ideal weight.). Liver Function Tests: Recent Labs  Lab 01/07/21 0626 01/10/2021 1122  AST 14* 37  ALT 6 13  ALKPHOS 69  67  BILITOT 1.9* 1.9*  PROT 7.5 7.5  ALBUMIN 4.1 3.8   No results for input(s): LIPASE, AMYLASE in the last 168 hours. No results for input(s): AMMONIA in the last 168 hours. Coagulation Profile: Recent Labs  Lab 01/06/2021 1122  INR 1.1   Cardiac Enzymes: Recent Labs  Lab 01/06/2021 1122  CKTOTAL 936*   BNP (last 3 results) No results for input(s): PROBNP in the last 8760 hours. HbA1C: No results for input(s): HGBA1C in the last 72 hours. CBG: Recent Labs  Lab 12/23/2020 1109  GLUCAP 134*   Lipid Profile: No results for input(s): CHOL, HDL, LDLCALC, TRIG, CHOLHDL, LDLDIRECT in the last 72 hours. Thyroid Function Tests: No results for input(s): TSH, T4TOTAL, FREET4, T3FREE, THYROIDAB  in the last 72 hours. Anemia Panel: No results for input(s): VITAMINB12, FOLATE, FERRITIN, TIBC, IRON, RETICCTPCT in the last 72 hours. Urine analysis:    Component Value Date/Time   COLORURINE YELLOW 01/07/2021 0626   APPEARANCEUR CLEAR 01/07/2021 0626   LABSPEC 1.019 01/07/2021 0626   PHURINE 5.5 01/07/2021 0626   GLUCOSEU NEGATIVE 01/07/2021 0626   HGBUR NEGATIVE 01/07/2021 0626   BILIRUBINUR NEGATIVE 01/07/2021 0626   KETONESUR NEGATIVE 01/07/2021 0626   PROTEINUR NEGATIVE 01/07/2021 0626   NITRITE NEGATIVE 01/07/2021 0626   LEUKOCYTESUR NEGATIVE 01/07/2021 0626    Radiological Exams on Admission: DG Pelvis 1-2 Views  Result Date: 01/02/2021 CLINICAL DATA:  fall EXAM: PELVIS - 1-2 VIEW COMPARISON:  April 27, 2015 FINDINGS: Osteopenia. No definitive displaced fracture visualized. Moderate degenerative changes with rim osteophyte formation of bilateral hips limits evaluation for subtle fractures. Degenerative changes of the lower lumbar spine. Pelvic phleboliths. IMPRESSION: No acute displaced fracture visualized on this single AP view. If persistent concern for nondisplaced fractures, recommend dedicated additional views and/or cross-sectional imaging. Electronically Signed   By:  Valentino Saxon M.D.   On: 12/26/2020 11:31   CT Head Wo Contrast  Result Date: 12/25/2020 CLINICAL DATA:  Patient fell while changing a light bulb. Patient hit the back of his head. EXAM: CT HEAD WITHOUT CONTRAST CT CERVICAL SPINE WITHOUT CONTRAST TECHNIQUE: Multidetector CT imaging of the head and cervical spine was performed following the standard protocol without intravenous contrast. Multiplanar CT image reconstructions of the cervical spine were also generated. COMPARISON:  04/27/2015 FINDINGS: CT HEAD FINDINGS Brain: Multiple patchy areas hypoattenuation are noted involving a significant portion of the right MCA and ACA territories, with multiple areas of loss of the normal gray-white junction. Hypoattenuation also involves the right insular cortex. There is mass effect with right-sided sulcal effacement and slight midline shift to the left of 2-3 mm. These findings are new from the prior CT. Ventricles are normal in size and configuration. No hydrocephalus. There is mild effacement of the right lateral ventricle. No parenchymal masses. Patchy areas of white matter hypoattenuation on the left are consistent with mild chronic microvascular ischemic change. There are no extra-axial masses or abnormal fluid collections. No intracranial hemorrhage. Vascular: No hyperdense vessel or unexpected calcification. Skull: Normal. Negative for fracture or focal lesion. Sinuses/Orbits: Globes and orbits are unremarkable. Previous left mastoidectomy. Clear right mastoid air cells. Mild ethmoid sinus mucosal thickening. Other: None. CT CERVICAL SPINE FINDINGS Alignment: Normal. Skull base and vertebrae: No acute fracture. No primary bone lesion or focal pathologic process. Soft tissues and spinal canal: No prevertebral fluid or swelling. No visible canal hematoma. Disc levels: Marked loss of disc height at C3-C4. Mild loss of disc height at C6-C7. Mild facet degenerative changes on the right most notably at C5-C6. No  convincing disc herniation. Upper chest: No acute findings. Other: Dense carotid atherosclerotic calcifications at the bifurcations and proximal internal carotid arteries. IMPRESSION: HEAD CT 1. Multiple areas of hypoattenuation along the right cerebral hemisphere, involving the gray-white junction, and involving a significant portion of the right MCA and ACA vascular territories. Findings are consistent with recent infarction. 2. No intracranial hemorrhage. No findings consistent with intracranial trauma. CERVICAL CT 1. No fracture or acute finding. Critical Value/emergent results were called by telephone at the time of interpretation on 01/05/2021 at 12:12 pm to provider [facility] , who verbally acknowledged these results. Electronically Signed   By: Lajean Manes M.D.   On: 01/13/2021 12:09   CT Cervical  Spine Wo Contrast  Result Date: 01/10/2021 CLINICAL DATA:  Patient fell while changing a light bulb. Patient hit the back of his head. EXAM: CT HEAD WITHOUT CONTRAST CT CERVICAL SPINE WITHOUT CONTRAST TECHNIQUE: Multidetector CT imaging of the head and cervical spine was performed following the standard protocol without intravenous contrast. Multiplanar CT image reconstructions of the cervical spine were also generated. COMPARISON:  04/27/2015 FINDINGS: CT HEAD FINDINGS Brain: Multiple patchy areas hypoattenuation are noted involving a significant portion of the right MCA and ACA territories, with multiple areas of loss of the normal gray-white junction. Hypoattenuation also involves the right insular cortex. There is mass effect with right-sided sulcal effacement and slight midline shift to the left of 2-3 mm. These findings are new from the prior CT. Ventricles are normal in size and configuration. No hydrocephalus. There is mild effacement of the right lateral ventricle. No parenchymal masses. Patchy areas of white matter hypoattenuation on the left are consistent with mild chronic microvascular  ischemic change. There are no extra-axial masses or abnormal fluid collections. No intracranial hemorrhage. Vascular: No hyperdense vessel or unexpected calcification. Skull: Normal. Negative for fracture or focal lesion. Sinuses/Orbits: Globes and orbits are unremarkable. Previous left mastoidectomy. Clear right mastoid air cells. Mild ethmoid sinus mucosal thickening. Other: None. CT CERVICAL SPINE FINDINGS Alignment: Normal. Skull base and vertebrae: No acute fracture. No primary bone lesion or focal pathologic process. Soft tissues and spinal canal: No prevertebral fluid or swelling. No visible canal hematoma. Disc levels: Marked loss of disc height at C3-C4. Mild loss of disc height at C6-C7. Mild facet degenerative changes on the right most notably at C5-C6. No convincing disc herniation. Upper chest: No acute findings. Other: Dense carotid atherosclerotic calcifications at the bifurcations and proximal internal carotid arteries. IMPRESSION: HEAD CT 1. Multiple areas of hypoattenuation along the right cerebral hemisphere, involving the gray-white junction, and involving a significant portion of the right MCA and ACA vascular territories. Findings are consistent with recent infarction. 2. No intracranial hemorrhage. No findings consistent with intracranial trauma. CERVICAL CT 1. No fracture or acute finding. Critical Value/emergent results were called by telephone at the time of interpretation on 01/02/2021 at 12:12 pm to provider [facility] , who verbally acknowledged these results. Electronically Signed   By: Lajean Manes M.D.   On: 01/06/2021 12:09   DG Chest Port 1 View  Result Date: 01/08/2021 CLINICAL DATA:  AMS, presumed fall EXAM: PORTABLE CHEST 1 VIEW COMPARISON:  December 20, 2020 FINDINGS: The cardiomediastinal silhouette is unchanged and enlarged in contour.Atherosclerotic calcifications of the aorta. No large pleural effusion; incomplete assessment of the RIGHT costophrenic angle. No  pneumothorax. New bandlike opacity of the RIGHT mid lung. Visualized abdomen is unremarkable. Multilevel degenerative changes of the thoracic spine. IMPRESSION: New band like opacity of the RIGHT mid lung, likely atelectasis. Superimposed infection could present similarly. Electronically Signed   By: Valentino Saxon M.D.   On: 01/07/2021 11:28    EKG: Independently reviewed.  A. fib, rate controlled, frequent PVCs.  Assessment/Plan Active Problems:   CVA (cerebral vascular accident) ([place])  (please populate well all problems here in Problem List. (For example, if patient is on BP meds at home and you resume or decide to hold them, it is a problem that needs to be her. Same for CAD, COPD, HLD and so on)  Right sided MCA, ACA embolic stroke. -Multiple symptoms involve altered mentations, left-sided weakness and right-sided neglect possibly acute aphasia, and questionable dysphagia as well. -Consider this is  break through event(s) while he is on Eliquis. But given the area of the stroke involved appears to be more than 1/3 of the ACA, and MSA distribution, thus hemorrhagic transformation is high, decide to hold Eliquis for today. -Neurology consulted. -Will hold off further MRI and MRA, since there is already clear evidence of stroke on CT head today.Doubt more workup will improve short or long term prognosis.  Given the severe deterioration of multiple functions and CT showing multiple strokes same time, prognosis is grave.  Discussed with patient son-in-law at bedside and daughter over the phone, both agreed with palliative care and will consider hospice depending on clinical course. -NPO, aspiration precautions. -ASA suppository and statin if tolerates any PO.  Right maxillary B-cell lymphoma status post palliative radiation 2020 -PET scan last year showed no significant relapse. Unclear this is related to the stroke, as it was two years ago.  HTN -PRN IV Hydralazine now.  GI  prophylaxis -PPI  CKD stage IIIb -Clinically appears to be dehydrated, start IVF.  DVT prophylaxis: Heparin subQ Code Status: DNR Family Communication: Son-in-law and daughter Disposition Plan: Patient is sick, more than 2 midnight hospitalization, expect discharge to hospice. Consults called: Neuro, palliative care Admission status: Tele admit   Lequita Halt MD [facility] Pager [phone]  01/17/2021, 1:53 PM

## 2021-01-12 DIAGNOSIS — Z515 Encounter for palliative care: Secondary | ICD-10-CM | POA: Diagnosis not present

## 2021-01-12 DIAGNOSIS — Z7189 Other specified counseling: Secondary | ICD-10-CM | POA: Diagnosis not present

## 2021-01-12 DIAGNOSIS — R0989 Other specified symptoms and signs involving the circulatory and respiratory systems: Secondary | ICD-10-CM

## 2021-01-12 DIAGNOSIS — E860 Dehydration: Secondary | ICD-10-CM | POA: Diagnosis not present

## 2021-01-12 DIAGNOSIS — I639 Cerebral infarction, unspecified: Secondary | ICD-10-CM | POA: Diagnosis not present

## 2021-01-12 MED ORDER — GLYCOPYRROLATE 0.2 MG/ML IJ SOLN
0.4000 mg | Freq: Once | INTRAMUSCULAR | Status: AC
  Administered 2021-01-12: 0.4 mg via INTRAVENOUS
  Filled 2021-01-12: qty 2

## 2021-01-12 MED ORDER — GLYCOPYRROLATE 0.2 MG/ML IJ SOLN
0.2000 mg | INTRAMUSCULAR | Status: DC
  Administered 2021-01-12 – 2021-01-13 (×3): 0.2 mg via INTRAVENOUS
  Filled 2021-01-12 (×4): qty 1

## 2021-01-12 MED ORDER — MORPHINE SULFATE (PF) 2 MG/ML IV SOLN: 1.0000 mg | Freq: Once | INTRAVENOUS | Status: DC

## 2021-01-12 NOTE — Progress Notes (Signed)
Daily Progress Note   Patient Name: Ralph Mendoza       Date: 01/12/2021 DOB: 1921-01-03  Age: 87 y.o. MRN#: [id number] Attending Physician: Barb Merino, MD Primary Care Physician: [name], MD Admit Date: 01/04/2021  Reason for Consultation/Follow-up: Establishing goals of care, Non pain symptom management, Pain control, Psychosocial/spiritual support, and Terminal Care  Subjective: Chart review performed. Received report from primary RN - acute concern on respiratory secretions. Encouraged use of robinul. Expectations at EOL was reviewed. Patient is not eating or drinking.  Went to visit patient at bedside - son in law/Ralph Mendoza present. Patient was lying in bed - he does not wake to voice/gentle touch. No signs or non-verbal gestures of pain or discomfort noted. No respiratory distress or increased work of breathing; secretions audible without the use of stethoscope. Patient is on 3L O2 [place].   Emotional support provided to Ralph Mendoza. Therapeutic listening as he reflects on events over the last several days. Reviewed expectations at EOL. Discussed removing oxygen as it is considered a life prolonging measure - he was agreeable to remove oxygen and utilize comfort medications for symptom management. Discussed plan for symptom management for respiratory secretions.   Reviewed family's thoughts on transport to hospice facility. Stated they were considering one in [place]. Education provided that it is anticipated patient will continue to decline. Reviewed that, at this time, patient appears stable for transfer; however, may not be stable for transfer tomorrow. Recommendation given to transport patient sooner than later if that was their goal. Ralph Mendoza states the family were hoping patient could remain in  house for EOL care, as they do not wish to put him through a transfer right now. Recommendation given for hospice facility but validated patient could remain in house for EOL care - he expressed appreciation. Explained we would likely anticipate hospital death in that situation - he expressed understanding.  All questions and concerns addressed. Encouraged to call with questions and/or concerns. PMT card previously provided.  Discussed with RN symptom management plan.   Length of Stay: 1  Current Medications: Scheduled Meds:  . antiseptic oral rinse  15 mL Topical BID  . LORazepam  1 mg Intravenous Once    Continuous Infusions:   PRN Meds: acetaminophen **OR** acetaminophen, glycopyrrolate **OR** glycopyrrolate **OR** glycopyrrolate, haloperidol **OR** haloperidol **OR** haloperidol lactate, LORazepam **  OR** LORazepam **OR** LORazepam, morphine injection, ondansetron **OR** ondansetron (ZOFRAN) IV, polyvinyl alcohol  Physical Exam Vitals and nursing note reviewed.  Constitutional:      General: He is not in acute distress.    Appearance: He is cachectic. He is ill-appearing.  Pulmonary:     Effort: No respiratory distress.  Skin:    General: Skin is cool and dry.  Neurological:     Mental Status: He is unresponsive.     Motor: Weakness present.  Psychiatric:        Speech: He is noncommunicative.            Vital Signs: BP (!) 117/52   Pulse 74   Temp 97.9 F (36.6 C) (Axillary)   Resp 20   SpO2 (!) 87%  SpO2: SpO2: (!) 87 % O2 Device: O2 Device: Nasal Cannula O2 Flow Rate:    Intake/output summary:  Intake/Output Summary (Last 24 hours) at 01/12/2021 1206 Last data filed at 01/01/2021 1539 Gross per 24 hour  Intake 500 ml  Output --  Net 500 ml   LBM:   Baseline Weight:   Most recent weight:         Palliative Assessment/Data: PPS 10%    Flowsheet Rows    Flowsheet Row Most Recent Value  Intake Tab   Referral Department Hospitalist  Unit at Time of  Referral ER  Palliative Care Primary Diagnosis Neurology  Date Notified 12/29/2020  Palliative Care Type New Palliative care  Date of Admission 01/13/2021  Date first seen by Palliative Care 12/31/2020  # of days Palliative referral response time 0 Day(s)  # of days IP prior to Palliative referral 0  Clinical Assessment   Psychosocial & Spiritual Assessment   Palliative Care Outcomes   Patient/Family meeting held? Yes  Who was at the meeting? daughter, son in law, granddaughter  Palliative Care Outcomes Improved pain interventions, Improved non-pain symptom therapy, Clarified goals of care, Counseled regarding hospice, Provided end of life care assistance, Provided psychosocial or spiritual support, Changed to focus on comfort  Patient/Family wishes: Interventions discontinued/not started  Mechanical Ventilation, Antibiotics, Tube feedings/TPN, BiPAP, Hemodialysis, NIPPV, Trach, Transfusion, Vasopressors, PEG, Transfer out of ICU       Patient Active Problem List   Diagnosis Date Noted  . CVA (cerebral vascular accident) ([place]) 12/27/2020  . 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 Hyperbilirubinemia 03/01/2019  . Goals of care, counseling/discussion 03/01/2019  . Diffuse large b-cell lymphoma, lymph nodes of head, face, and neck ([place]) 02/27/2019  . Lobar pneumonia ([place]) 05/27/2017  . Thrombocytopenia ([place]) 05/27/2017  . Dehydration 05/27/2017  . Chronic systolic CHF (congestive heart failure) ([place]) 05/27/2017  . Recurrent right pleural effusion 09/27/2016  . CKD (chronic kidney disease) stage 3, GFR 30-59 ml/min ([facility]) 09/27/2016  . Normocytic anemia 09/27/2016  . Atrial fibrillation ([place]) 05/17/2012  . Essential hypertension 09/10/2008    Palliative Care Assessment & Plan   Patient Profile: 87 y.o. male  with past medical history of  chronic A. 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 CT showed multiple strokes that Neurology felt would be disabling - patient would no longer be able to live independently. Patient was admitted on 12/26/2020 with right sided MCA, ACA embolic stroke.    ED Course: No tachycardia  or hypotension, EKG showed chronic A. fib.  CT head showed multiple embolic infarcts left MCA and ACA territories.  Blood work WBC 10.8, hemoglobin 12.7, sodium 134, BUN 29, creatinine 1.7  Assessment: Right sided MCA and ACA embolic stroke Right maxillary B-cell lymphoma s/p palliative radiation CKD stage IIIb Terminal respiratory secretions Terminal care  Recommendations/Plan: Continue full comfort measures - likely hours to days Continue DNR/DNI as previously documented - durable DNR form completed and given to ED RN. Copy was made and will be scanned into Vynca/ACP tab At this time, family are considering hospice facility in [place] [employer] but also do not wish to put the patient through a transfer - they request patient remain in house for EOL care for now - anticipate hospital death Ordered one time dose of 0.4mg  robinul IV now for terminal respiratory secretions. Scheduled 0.2mg  IV q4hr after Removed oxygen. Do not replace. Manage symptoms with comfort medications. Nursing to provide frequent assessments and administer PRN medications as clinically necessary to ensure EOL comfort PMT will continue to follow and support holistically  Goals of Care and Additional Recommendations: Limitations on Scope of Treatment: Full Comfort Care  Code Status:    Code Status Orders  (From admission, onward)           Start     Ordered   01/02/2021 1740  Do not attempt resuscitation (DNR)  Continuous       Question Answer Comment  In the event of cardiac or respiratory ARREST Do not call a "code blue"   In the event of cardiac or respiratory ARREST  Do not perform Intubation, CPR, defibrillation or ACLS   In the event of cardiac or respiratory ARREST Use medication by any route, position, wound care, and other measures to relive pain and suffering. May use oxygen, suction and manual treatment of airway obstruction as needed for comfort.      12/24/2020 1750           Code Status History     Date Active Date Inactive Code Status Order ID Comments User Context   12/29/2020 1352 01/01/2021 1750 DNR [id number]  Lequita Halt, MD ED   05/27/2017 0413 05/30/2017 2114 Full Code [id number]  Etta Quill, DO ED   09/27/2016 1532 09/29/2016 1658 Full Code [id number]  Robbie Lis, MD Inpatient       Prognosis:  Hours - Days  Discharge Planning: Anticipated Hospital Death  Care plan was discussed with primary RN, patient's family  Thank you for allowing the Palliative Medicine Team to assist in the care of this patient.   Total Time 40 minutes Prolonged Time Billed  no       Greater than 50%  of this time was spent counseling and coordinating care related to the above assessment and plan.  Lin Landsman, NP  Please contact Palliative Medicine Team phone at [phone] for questions and concerns.

## 2021-01-12 NOTE — Progress Notes (Signed)
[facility]) Liaison Note  Referral received from [facility] for inpatient hospice facility.  Met at bedside with daughter, Tye [place], and other family members to discuss [facility] facilities in [place] Dance movement psychotherapist) and [employer] ([place] Beds are availbale in either location.  Family declines either location, preferring a bed in the [place] area. TOC made aware.  Family did ask about futility of moving pt at this point.  Wet quality noted to breathing; robinul had been given to address this need.  Discussed with bedside RN.  No others signs of discomfort noted.   Thank you for the opportunity to participate in this pt's care.  Domenic Moras, BSN, RN [facility] liaison [phone]

## 2021-01-12 NOTE — ED Notes (Signed)
Family at bedside. Provided with refreshments  and offered emotional support. Denies any needs at this time

## 2021-01-12 NOTE — ED Notes (Signed)
Patient mouth swabbed with water. Patient has wet cough intermittently at this time. Family denies any needs at this time.

## 2021-01-12 NOTE — Progress Notes (Addendum)
STROKE TEAM PROGRESS NOTE   INTERVAL HISTORY His son in law is at the bedside.  We reviewed stroke diagnosis and goals of care with family. Pt is unresponsive at this time and hospice is being arranged.  CT scan of the head shows a large right ACA and MCA infarct.  Patient has been made full comfort care measures only.  His son-in-law is at the bedside.  Currently appears comfortable with the decision. Vitals:   01/12/21 0800 01/12/21 1100 01/12/21 1200 01/12/21 1300  BP: (!) 106/91 (!) 117/52 (!) 118/54 (!) 110/58  Pulse: 65 74 66 71  Resp: (!) 21 20 (!) 23 (!) 24  Temp:      TempSrc:      SpO2: 94% (!) 87% 93% 90%   CBC:  Recent Labs  Lab 01/07/21 0626 01/01/2021 1122  WBC 7.5 7.8  NEUTROABS 5.7 6.4  HGB 11.3* 12.7*  HCT 34.4* 38.6*  MCV 93.2 95.8  PLT 171 784   Basic Metabolic Panel:  Recent Labs  Lab 01/07/21 0626 12/25/2020 1122  NA 136 134*  K 4.0 4.1  CL 98 98  CO2 27 22  GLUCOSE 122* 137*  BUN 30* 29*  CREATININE 1.55* 1.77*  CALCIUM 9.5 9.2    IMAGING past 24 hours No results found.  PHYSICAL EXAM Gen- lying comfortably, no acute distress and appears to be end of life. Shallow breathing Neuro- limited d/t respect of GOC. Did not arouse to voice. Did not follow commands. Did not apply noxious stimuli.  ASSESSMENT/PLAN [name] is a 87 y.o. male with history of Afib, on Eliquis, BPH, presenting with unresponsiveness and found down on the ground.  On initial examination, he  had left-sided weakness. CT head consistent with right ACA and MCA territory infarctions. Outside the window for IV tPA. Given the already established hypodensities on the scan, he was a candidate for thrombectomy. Family wished to move to Comfort care at this time since he would likely have considerable deficits on the left side of his body from the strokes. No further stroke work up indicated at this time. Hospice care is being arranged.   Other Stroke Risk Factors Advanced Age  >/= 15  Former Cigarette smoker  Other Active Problems DNR/DNI CMO and moving towards hospice care per family wishes  Hospital day # 1  Ralph Mendoza, ARNP-C, ANVP-BC Pager: [phone]   I have personally obtained history,examined this patient, reviewed notes, independently viewed imaging studies, participated in medical decision making and plan of care.ROS completed by me personally and pertinent positives fully documented  I have made any additions or clarifications directly to the above note. Agree with note above.  Patient has presented with a large right ACA and MCA infarct in prognosis is quite poor and is unlikely to survive make meaningful improvement.  Family has agreed to DNR and full comfort care measures and agreement with the plan.  Stroke team will sign off.  Kindly call for questions.  Discussed with Dr. Sloan Leiter  Greater than 50% time during this 25-minute visit was spent on counseling and coordination of care and discussion with care team and answering questions  Antony Contras, MD Medical Director [place] Pager: [phone] 01/12/2021 4:34 PM   To contact Stroke Continuity provider, please refer to [email or web address] After hours, contact General Neurology

## 2021-01-12 NOTE — TOC Progression Note (Addendum)
Transition of Care [facility]) - Progression Note    Patient Details  Name: TERRELL OSTRAND MRN: [id number] Date of Birth: [birth year]  Transition [facility]) CM/SW [place], RN Phone Number: 01/12/2021, 10:48 AM  Clinical Narrative:     Called daughter Tye [place] to check on patient, see if they decided on residential hospice. Left confidential message to call  this CM back for decision.  89 Daughter Alyse Low called back, they are now looking in [place], [place] for residential, as most of the family lives there and he has a home there. They want to "see how he does" the next couple of days, but make sure he is stable to transfer as well to residential. Permission given to consult with [facility] concerning patient.  [place] on call , awaiting return call.   Expected Discharge Plan: [place] Barriers to Discharge: Continued Medical Work up  Expected Discharge Plan and Services Expected Discharge Plan: [place] In-house Referral: Clinical Social Work Discharge Planning Services: CM Consult   Living arrangements for the past 2 months: Apartment, [place]                                       Social Determinants of Health (SDOH) Interventions    Readmission Risk Interventions No flowsheet data found.

## 2021-01-12 NOTE — Progress Notes (Signed)
PROGRESS NOTE    Ralph Mendoza  DDU:[id number] DOB: 12-12-1920 DOA: 12/19/2020 PCP: [name], MD    Brief Narrative:  87 year old gentleman with history of chronic A. 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  In the emergency room he was hemodynamically stable.  EKG showed chronic A. fib.  CT head showed multiple embolic infarcts left MCA and ACA territories.  Seen by neurology.  With his depressed mentation, palliation and hospice recommended.   Assessment & Plan:   Principal Problem:   Admission for end of life care Active Problems:   CVA (cerebral vascular accident) [facility])  End-of-life care Multiple areas of embolic a stroke Failure to thrive Acute metabolic encephalopathy due to stroke. Ambulatory dysfunction and multiple medical issues.  Plan: Full comfort measures DNR/DNI All symptom control medications available Unrestricted visitor policy Palliative and hospice to follow in the hospital.  Family member trying to work on getting hospice facility at [employer].  He may pass away in the hospital.   DVT prophylaxis:   Comfort care   Code Status: Comfort care Family Communication: None.  Palliative and case management communicating. Disposition Plan: Status is: Inpatient  Remains inpatient appropriate because:Unsafe d/c plan  Dispo: The patient is from: Home              Anticipated d/c is to:  Inpatient hospice              Patient currently is not medically stable to d/c.   Difficult to place patient No         Consultants:  Neurology Palliative and hospice  Procedures:  None  Antimicrobials:  None   Subjective: Seen patient in the morning rounds.  He was laying in the bedside, not able to communicate.  Looked comfortable.  Objective: Vitals:   01/12/21 0800 01/12/21  1100 01/12/21 1200 01/12/21 1300  BP: (!) 106/91 (!) 117/52 (!) 118/54 (!) 110/58  Pulse: 65 74 66 71  Resp: (!) 21 20 (!) 23 (!) 24  Temp:      TempSrc:      SpO2: 94% (!) 87% 93% 90%    Intake/Output Summary (Last 24 hours) at 01/12/2021 1405 Last data filed at 12/25/2020 1539 Gross per 24 hour  Intake 500 ml  Output --  Net 500 ml   There were no vitals filed for this visit.  Examination:  Sick looking gentleman.  Unresponsive and laying in the bed.  Unable to response to stimuli.  Looks comfortable.   Data Reviewed: I have personally reviewed following labs and imaging studies  CBC: Recent Labs  Lab 01/07/21 0626 01/17/2021 1122  WBC 7.5 7.8  NEUTROABS 5.7 6.4  HGB 11.3* 12.7*  HCT 34.4* 38.6*  MCV 93.2 95.8  PLT 171 237   Basic Metabolic Panel: Recent Labs  Lab 01/07/21 0626 01/13/2021 1122  NA 136 134*  K 4.0 4.1  CL 98 98  CO2 27 22  GLUCOSE 122* 137*  BUN 30* 29*  CREATININE 1.55* 1.77*  CALCIUM 9.5 9.2   GFR: CrCl cannot be calculated (Unknown ideal weight.). Liver Function Tests: Recent Labs  Lab 01/07/21 0626 12/22/2020 1122  AST 14* 37  ALT 6 13  ALKPHOS 69 67  BILITOT 1.9* 1.9*  PROT 7.5 7.5  ALBUMIN 4.1 3.8   No results for input(s): LIPASE, AMYLASE in the last 168 hours. No  results for input(s): AMMONIA in the last 168 hours. Coagulation Profile: Recent Labs  Lab 12/23/2020 1122  INR 1.1   Cardiac Enzymes: Recent Labs  Lab 12/30/2020 1122  CKTOTAL 936*   BNP (last 3 results) No results for input(s): PROBNP in the last 8760 hours. HbA1C: No results for input(s): HGBA1C in the last 72 hours. CBG: Recent Labs  Lab 01/17/2021 1109  GLUCAP 134*   Lipid Profile: No results for input(s): CHOL, HDL, LDLCALC, TRIG, CHOLHDL, LDLDIRECT in the last 72 hours. Thyroid Function Tests: No results for input(s): TSH, T4TOTAL, FREET4, T3FREE, THYROIDAB in the last 72 hours. Anemia Panel: No results for input(s): VITAMINB12, FOLATE, FERRITIN,  TIBC, IRON, RETICCTPCT in the last 72 hours. Sepsis Labs: Recent Labs  Lab 01/05/2021 1122 01/17/2021 1340  LATICACIDVEN 2.9* 2.1*    Recent Results (from the past 240 hour(s))  Resp Panel by RT-PCR (Flu A&B, Covid) Nasopharyngeal Swab     Status: None   Collection Time: 01/07/21 10:59 AM   Specimen: Nasopharyngeal Swab; Nasopharyngeal(NP) swabs in vial transport medium  Result Value Ref Range Status   SARS Coronavirus 2 by RT PCR NEGATIVE NEGATIVE Final    Comment: (NOTE) SARS-CoV-2 target nucleic acids are NOT DETECTED.  The SARS-CoV-2 RNA is generally detectable in upper respiratory specimens during the acute phase of infection. The lowest concentration of SARS-CoV-2 viral copies this assay can detect is 138 copies/mL. A negative result does not preclude SARS-Cov-2 infection and should not be used as the sole basis for treatment or other patient management decisions. A negative result may occur with  improper specimen collection/handling, submission of specimen other than nasopharyngeal swab, presence of viral mutation(s) within the areas targeted by this assay, and inadequate number of viral copies(<138 copies/mL). A negative result must be combined with clinical observations, patient history, and epidemiological information. The expected result is Negative.  Fact Sheet for Patients:  EntrepreneurPulse.com.au  Fact Sheet for Healthcare Providers:  IncredibleEmployment.be  This test is no t yet approved or cleared by the [place] FDA and  has been authorized for detection and/or diagnosis of SARS-CoV-2 by FDA under an Emergency Use Authorization (EUA). This EUA will remain  in effect (meaning this test can be used) for the duration of the COVID-19 declaration under Section 564(b)(1) of the Act, 21 U.S.C.section 360bbb-3(b)(1), unless the authorization is terminated  or revoked sooner.       Influenza A by PCR NEGATIVE NEGATIVE  Final   Influenza B by PCR NEGATIVE NEGATIVE Final    Comment: (NOTE) The Xpert Xpress SARS-CoV-2/FLU/RSV plus assay is intended as an aid in the diagnosis of influenza from Nasopharyngeal swab specimens and should not be used as a sole basis for treatment. Nasal washings and aspirates are unacceptable for Xpert Xpress SARS-CoV-2/FLU/RSV testing.  Fact Sheet for Patients: EntrepreneurPulse.com.au  Fact Sheet for Healthcare Providers: IncredibleEmployment.be  This test is not yet approved or cleared by the [place] FDA and has been authorized for detection and/or diagnosis of SARS-CoV-2 by FDA under an Emergency Use Authorization (EUA). This EUA will remain in effect (meaning this test can be used) for the duration of the COVID-19 declaration under Section 564(b)(1) of the Act, 21 U.S.C. section 360bbb-3(b)(1), unless the authorization is terminated or revoked.  Performed at [facility], [address] [place], [place] [address]   Resp Panel by RT-PCR (Flu A&B, Covid) Nasopharyngeal Swab     Status: None   Collection Time: 12/26/2020 10:54 AM   Specimen: Nasopharyngeal Swab;  Nasopharyngeal(NP) swabs in vial transport medium  Result Value Ref Range Status   SARS Coronavirus 2 by RT PCR NEGATIVE NEGATIVE Final    Comment: (NOTE) SARS-CoV-2 target nucleic acids are NOT DETECTED.  The SARS-CoV-2 RNA is generally detectable in upper respiratory specimens during the acute phase of infection. The lowest concentration of SARS-CoV-2 viral copies this assay can detect is 138 copies/mL. A negative result does not preclude SARS-Cov-2 infection and should not be used as the sole basis for treatment or other patient management decisions. A negative result may occur with  improper specimen collection/handling, submission of specimen other than nasopharyngeal swab, presence of viral mutation(s) within the areas targeted by this  assay, and inadequate number of viral copies(<138 copies/mL). A negative result must be combined with clinical observations, patient history, and epidemiological information. The expected result is Negative.  Fact Sheet for Patients:  EntrepreneurPulse.com.au  Fact Sheet for Healthcare Providers:  IncredibleEmployment.be  This test is no t yet approved or cleared by the [place] FDA and  has been authorized for detection and/or diagnosis of SARS-CoV-2 by FDA under an Emergency Use Authorization (EUA). This EUA will remain  in effect (meaning this test can be used) for the duration of the COVID-19 declaration under Section 564(b)(1) of the Act, 21 U.S.C.section 360bbb-3(b)(1), unless the authorization is terminated  or revoked sooner.       Influenza A by PCR NEGATIVE NEGATIVE Final   Influenza B by PCR NEGATIVE NEGATIVE Final    Comment: (NOTE) The Xpert Xpress SARS-CoV-2/FLU/RSV plus assay is intended as an aid in the diagnosis of influenza from Nasopharyngeal swab specimens and should not be used as a sole basis for treatment. Nasal washings and aspirates are unacceptable for Xpert Xpress SARS-CoV-2/FLU/RSV testing.  Fact Sheet for Patients: EntrepreneurPulse.com.au  Fact Sheet for Healthcare Providers: IncredibleEmployment.be  This test is not yet approved or cleared by the [place] FDA and has been authorized for detection and/or diagnosis of SARS-CoV-2 by FDA under an Emergency Use Authorization (EUA). This EUA will remain in effect (meaning this test can be used) for the duration of the COVID-19 declaration under Section 564(b)(1) of the Act, 21 U.S.C. section 360bbb-3(b)(1), unless the authorization is terminated or revoked.  Performed at [facility], [place] [address], [place], [place] [address]          Radiology Studies: DG Pelvis 1-2 Views  Result Date:  01/01/2021 CLINICAL DATA:  fall EXAM: PELVIS - 1-2 VIEW COMPARISON:  April 27, 2015 FINDINGS: Osteopenia. No definitive displaced fracture visualized. Moderate degenerative changes with rim osteophyte formation of bilateral hips limits evaluation for subtle fractures. Degenerative changes of the lower lumbar spine. Pelvic phleboliths. IMPRESSION: No acute displaced fracture visualized on this single AP view. If persistent concern for nondisplaced fractures, recommend dedicated additional views and/or cross-sectional imaging. Electronically Signed   By: Valentino Saxon M.D.   On: 12/20/2020 11:31   CT Head Wo Contrast  Result Date: 12/30/2020 CLINICAL DATA:  Patient fell while changing a light bulb. Patient hit the back of his head. EXAM: CT HEAD WITHOUT CONTRAST CT CERVICAL SPINE WITHOUT CONTRAST TECHNIQUE: Multidetector CT imaging of the head and cervical spine was performed following the standard protocol without intravenous contrast. Multiplanar CT image reconstructions of the cervical spine were also generated. COMPARISON:  04/27/2015 FINDINGS: CT HEAD FINDINGS Brain: Multiple patchy areas hypoattenuation are noted involving a significant portion of the right MCA and ACA territories, with multiple areas of loss of the normal gray-white  junction. Hypoattenuation also involves the right insular cortex. There is mass effect with right-sided sulcal effacement and slight midline shift to the left of 2-3 mm. These findings are new from the prior CT. Ventricles are normal in size and configuration. No hydrocephalus. There is mild effacement of the right lateral ventricle. No parenchymal masses. Patchy areas of white matter hypoattenuation on the left are consistent with mild chronic microvascular ischemic change. There are no extra-axial masses or abnormal fluid collections. No intracranial hemorrhage. Vascular: No hyperdense vessel or unexpected calcification. Skull: Normal. Negative for fracture or focal  lesion. Sinuses/Orbits: Globes and orbits are unremarkable. Previous left mastoidectomy. Clear right mastoid air cells. Mild ethmoid sinus mucosal thickening. Other: None. CT CERVICAL SPINE FINDINGS Alignment: Normal. Skull base and vertebrae: No acute fracture. No primary bone lesion or focal pathologic process. Soft tissues and spinal canal: No prevertebral fluid or swelling. No visible canal hematoma. Disc levels: Marked loss of disc height at C3-C4. Mild loss of disc height at C6-C7. Mild facet degenerative changes on the right most notably at C5-C6. No convincing disc herniation. Upper chest: No acute findings. Other: Dense carotid atherosclerotic calcifications at the bifurcations and proximal internal carotid arteries. IMPRESSION: HEAD CT 1. Multiple areas of hypoattenuation along the right cerebral hemisphere, involving the gray-white junction, and involving a significant portion of the right MCA and ACA vascular territories. Findings are consistent with recent infarction. 2. No intracranial hemorrhage. No findings consistent with intracranial trauma. CERVICAL CT 1. No fracture or acute finding. Critical Value/emergent results were called by telephone at the time of interpretation on 01/15/2021 at 12:12 pm to provider [facility] , who verbally acknowledged these results. Electronically Signed   By: Lajean Manes M.D.   On: 01/09/2021 12:09   CT Cervical Spine Wo Contrast  Result Date: 01/10/2021 CLINICAL DATA:  Patient fell while changing a light bulb. Patient hit the back of his head. EXAM: CT HEAD WITHOUT CONTRAST CT CERVICAL SPINE WITHOUT CONTRAST TECHNIQUE: Multidetector CT imaging of the head and cervical spine was performed following the standard protocol without intravenous contrast. Multiplanar CT image reconstructions of the cervical spine were also generated. COMPARISON:  04/27/2015 FINDINGS: CT HEAD FINDINGS Brain: Multiple patchy areas hypoattenuation are noted involving a significant  portion of the right MCA and ACA territories, with multiple areas of loss of the normal gray-white junction. Hypoattenuation also involves the right insular cortex. There is mass effect with right-sided sulcal effacement and slight midline shift to the left of 2-3 mm. These findings are new from the prior CT. Ventricles are normal in size and configuration. No hydrocephalus. There is mild effacement of the right lateral ventricle. No parenchymal masses. Patchy areas of white matter hypoattenuation on the left are consistent with mild chronic microvascular ischemic change. There are no extra-axial masses or abnormal fluid collections. No intracranial hemorrhage. Vascular: No hyperdense vessel or unexpected calcification. Skull: Normal. Negative for fracture or focal lesion. Sinuses/Orbits: Globes and orbits are unremarkable. Previous left mastoidectomy. Clear right mastoid air cells. Mild ethmoid sinus mucosal thickening. Other: None. CT CERVICAL SPINE FINDINGS Alignment: Normal. Skull base and vertebrae: No acute fracture. No primary bone lesion or focal pathologic process. Soft tissues and spinal canal: No prevertebral fluid or swelling. No visible canal hematoma. Disc levels: Marked loss of disc height at C3-C4. Mild loss of disc height at C6-C7. Mild facet degenerative changes on the right most notably at C5-C6. No convincing disc herniation. Upper chest: No acute findings. Other: Dense carotid atherosclerotic calcifications at  the bifurcations and proximal internal carotid arteries. IMPRESSION: HEAD CT 1. Multiple areas of hypoattenuation along the right cerebral hemisphere, involving the gray-white junction, and involving a significant portion of the right MCA and ACA vascular territories. Findings are consistent with recent infarction. 2. No intracranial hemorrhage. No findings consistent with intracranial trauma. CERVICAL CT 1. No fracture or acute finding. Critical Value/emergent results were called by  telephone at the time of interpretation on 01/06/2021 at 12:12 pm to provider [facility] , who verbally acknowledged these results. Electronically Signed   By: Lajean Manes M.D.   On: 01/01/2021 12:09   DG Chest Port 1 View  Result Date: 01/04/2021 CLINICAL DATA:  AMS, presumed fall EXAM: PORTABLE CHEST 1 VIEW COMPARISON:  December 20, 2020 FINDINGS: The cardiomediastinal silhouette is unchanged and enlarged in contour.Atherosclerotic calcifications of the aorta. No large pleural effusion; incomplete assessment of the RIGHT costophrenic angle. No pneumothorax. New bandlike opacity of the RIGHT mid lung. Visualized abdomen is unremarkable. Multilevel degenerative changes of the thoracic spine. IMPRESSION: New band like opacity of the RIGHT mid lung, likely atelectasis. Superimposed infection could present similarly. Electronically Signed   By: Valentino Saxon M.D.   On: 12/22/2020 11:28        Scheduled Meds:  antiseptic oral rinse  15 mL Topical BID   glycopyrrolate  0.2 mg Intravenous Q4H   LORazepam  1 mg Intravenous Once    morphine injection  1 mg Intravenous Once   Continuous Infusions:   LOS: 1 day    Time spent: 30 minutes    Barb Merino, MD [facility] Pager [phone]

## 2021-01-12 NOTE — ED Notes (Signed)
Pt placed on 2L [place] for comfort. Oxygen sat levels began to drop to 87% RA. O2 sats are maintaining at 95% on 2L [place].

## 2021-01-13 DIAGNOSIS — I639 Cerebral infarction, unspecified: Secondary | ICD-10-CM | POA: Diagnosis not present

## 2021-01-13 DIAGNOSIS — Z7189 Other specified counseling: Secondary | ICD-10-CM | POA: Diagnosis not present

## 2021-01-13 DIAGNOSIS — E860 Dehydration: Secondary | ICD-10-CM | POA: Diagnosis not present

## 2021-01-13 DIAGNOSIS — Z515 Encounter for palliative care: Secondary | ICD-10-CM | POA: Diagnosis not present

## 2021-01-13 MED ORDER — GLYCOPYRROLATE 0.2 MG/ML IJ SOLN
0.2000 mg | Freq: Three times a day (TID) | INTRAMUSCULAR | Status: DC
  Administered 2021-01-14 – 2021-01-18 (×11): 0.2 mg via INTRAVENOUS
  Filled 2021-01-13 (×14): qty 1

## 2021-01-13 NOTE — Progress Notes (Signed)
This chaplain responded to PMT consult for EOL spiritual care. The Pt. grand daughter-Stacey is at the bedside and shares the Pt. is resting comfortably.    The chaplain started building a rapport with the family through reflective listening. The chaplain understands additional family will visit this afternoon.  The chaplain will attempt a F/U spiritual care visit today after noon.    The chaplain understands the Pt. sacred space is outdoors and nature adds meaning to the Pt. life. Erline Levine shares the Pt. has stepped away from formal religion but other members of the family may be open to a time of prayer with the chaplain.

## 2021-01-13 NOTE — Progress Notes (Addendum)
Manufacturing engineer [facility]) Liaison Note  Based on family's wishes [facility] will close this referral at this time.  Please let us know if we can support this pt and family in any way.   Thank you for the opportunity to participate in this pt's care.  Domenic Moras, BSN, RN [facility] liaison [phone]

## 2021-01-13 NOTE — Progress Notes (Signed)
Daily Progress Note   Patient Name: Ralph Mendoza       Date: 01/13/2021 DOB: 1920-08-22  Age: 87 y.o. MRN#: [id number] Attending Physician: Barb Merino, MD Primary Care Physician: [name], MD Admit Date: 12/19/2020  Reason for Consultation/Follow-up: Non pain symptom management, Pain control, Psychosocial/spiritual support, and Terminal Care  Subjective: Chart review performed. Received report from primary RN - no acute concerns.   Went to visit patient at bedside - granddaughter/Stacy was present. Patient was lying in bed - he does not wake to voice/gentle touch. No signs or non-verbal gestures of pain or discomfort noted. No respiratory distress, increased work of breathing, or secretions noted.   Emotional support and therapeutic listening provided to [facility]. Expectations at EOL were reviewed. Prognostication was reviewed by her request. Discussed thoughts around transfer to hospice facility - family have considered this vs home hospice and have decided not to transfer patient and request he remain in house for EOL care.   All questions and concerns addressed. Encouraged to call with questions and/or concerns. PMT card provided.  Reviewed updated symptom mgmt plan with RN.    Length of Stay: 2  Current Medications: Scheduled Meds:  . antiseptic oral rinse  15 mL Topical BID  . glycopyrrolate  0.2 mg Intravenous Q8H  . LORazepam  1 mg Intravenous Once    Continuous Infusions:   PRN Meds: acetaminophen **OR** acetaminophen, glycopyrrolate **OR** glycopyrrolate **OR** glycopyrrolate, haloperidol **OR** haloperidol **OR** haloperidol lactate, LORazepam **OR** LORazepam **OR** LORazepam, morphine injection, ondansetron **OR** ondansetron (ZOFRAN) IV, polyvinyl  alcohol  Physical Exam Vitals and nursing note reviewed.  Constitutional:      General: He is not in acute distress.    Appearance: He is cachectic. He is ill-appearing.  Pulmonary:     Effort: No respiratory distress.  Skin:    General: Skin is cool and dry.  Neurological:     Mental Status: He is unresponsive.     Motor: Weakness present.  Psychiatric:        Speech: He is noncommunicative.            Vital Signs: BP (!) 102/55 (BP Location: Left Arm)   Pulse 72   Temp 98.3 F (36.8 C) (Axillary)   Resp 18   Ht 5\' 10"  (1.778 m)   Wt 69.9 kg  SpO2 (!) 88%   BMI 22.11 kg/m  SpO2: SpO2: (!) 88 % O2 Device: O2 Device: Room Air O2 Flow Rate:    Intake/output summary: No intake or output data in the 24 hours ending 01/13/21 1130 LBM: Last BM Date: 01/07/21 Baseline Weight: Weight: 69.9 kg Most recent weight: Weight: 69.9 kg       Palliative Assessment/Data: PPS 10%    Flowsheet Rows    Flowsheet Row Most Recent Value  Intake Tab   Referral Department Hospitalist  Unit at Time of Referral ER  Palliative Care Primary Diagnosis Neurology  Date Notified 01/14/2021  Palliative Care Type New Palliative care  Date of Admission 01/10/2021  Date first seen by Palliative Care 01/15/2021  # of days Palliative referral response time 0 Day(s)  # of days IP prior to Palliative referral 0  Clinical Assessment   Psychosocial & Spiritual Assessment   Palliative Care Outcomes   Patient/Family meeting held? Yes  Who was at the meeting? daughter, son in law, granddaughter  Palliative Care Outcomes Completed durable DNR  Patient/Family wishes: Interventions discontinued/not started  Mechanical Ventilation, Antibiotics, Tube feedings/TPN, BiPAP, Hemodialysis, NIPPV, Trach, Transfusion, Vasopressors, PEG, Transfer out of ICU       Patient Active Problem List   Diagnosis Date Noted  . CVA (cerebral vascular accident) ([place]) 01/17/2021  . 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 Hyperbilirubinemia 03/01/2019  . Admission for end of life care 03/01/2019  . Diffuse large b-cell lymphoma, lymph nodes of head, face, and neck ([place]) 02/27/2019  . Lobar pneumonia ([place]) 05/27/2017  . Thrombocytopenia ([place]) 05/27/2017  . Dehydration 05/27/2017  . Chronic systolic CHF (congestive heart failure) ([place]) 05/27/2017  . Recurrent right pleural effusion 09/27/2016  . CKD (chronic kidney disease) stage 3, GFR 30-59 ml/min (H[facility]) 09/27/2016  . Normocytic anemia 09/27/2016  . Atrial fibrillation ([place]) 05/17/2012  . Essential hypertension 09/10/2008    Palliative Care Assessment & Plan   Patient Profile: 87 y.o. male  with past medical history of  chronic A. 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 CT showed multiple strokes that Neurology felt would be disabling - patient would no longer be able to live independently. Patient was admitted on 01/06/2021 with right sided MCA, ACA embolic stroke.    ED Course: No tachycardia or hypotension, EKG showed chronic A. fib.  CT head showed multiple embolic infarcts left MCA and ACA territories.  Blood work WBC 10.8, hemoglobin 12.7, sodium 134, BUN 29, creatinine 1.7  Assessment: Right sided MCA and ACA embolic stroke Right maxillary B-cell lymphoma s/p palliative radiation CKD stage IIIb Terminal respiratory secretions Terminal care  Recommendations/Plan: Continue full comfort measures Continue DNR/DNI as previously documented Family have opted for patient to remain in house for EOL care, they do not want transfer to hospice facility or home with hospice - anticipate hospital death Secretions are better managed today - decreased scheduled robinul to q8hr Nursing to provide frequent assessments and administer PRN  medications as clinically necessary to ensure EOL comfort PMT will continue to follow and support holistically  Goals of Care and Additional Recommendations: Limitations on Scope of Treatment: Full Comfort Care  Code Status:    Code Status Orders  (From admission, onward)  Start     Ordered   12/28/2020 1740  Do not attempt resuscitation (DNR)  Continuous       Question Answer Comment  In the event of cardiac or respiratory ARREST Do not call a "code blue"   In the event of cardiac or respiratory ARREST Do not perform Intubation, CPR, defibrillation or ACLS   In the event of cardiac or respiratory ARREST Use medication by any route, position, wound care, and other measures to relive pain and suffering. May use oxygen, suction and manual treatment of airway obstruction as needed for comfort.      01/06/2021 1750           Code Status History     Date Active Date Inactive Code Status Order ID Comments User Context   12/26/2020 1352 01/09/2021 1750 DNR [id number]  Lequita Halt, MD ED   05/27/2017 0413 05/30/2017 2114 Full Code [id number]  Etta Quill, DO ED   09/27/2016 1532 09/29/2016 1658 Full Code [id number]  Robbie Lis, MD Inpatient       Prognosis:  Hours - Days  Discharge Planning: Anticipated Hospital Death  Care plan was discussed with primary RN, Dr. Sloan Leiter, [facility], patient's family  Thank you for allowing the Palliative Medicine Team to assist in the care of this patient.   Total Time 30 minutes Prolonged Time Billed  no       Greater than 50%  of this time was spent counseling and coordinating care related to the above assessment and plan.  Lin Landsman, NP  Please contact Palliative Medicine Team phone at [phone] for questions and concerns.

## 2021-01-13 NOTE — Progress Notes (Signed)
PROGRESS NOTE    Ralph Mendoza  WEX:[id number] DOB: [birth year] DOA: 01/05/2021 PCP: [name], MD    Brief Narrative:  87 year old gentleman with history of chronic A. 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  In the emergency room he was hemodynamically stable.  EKG showed chronic A. fib.  CT head showed multiple embolic infarcts left MCA and ACA territories.  Seen by neurology.  With his depressed mentation, palliation and hospice recommended.   Assessment & Plan:   Principal Problem:   Admission for end of life care Active Problems:   CVA (cerebral vascular accident) [facility])  End-of-life care Multiple areas of embolic stroke Failure to thrive Acute metabolic encephalopathy due to stroke. Ambulatory dysfunction and multiple medical issues.  Plan: Full comfort measures DNR/DNI All symptom control medications available Unrestricted visitor policy Palliative and hospice to follow in the hospital.  Family members interested to getting to hosp[facility] facility at [facility].  Patient is not a stable to ride 1 hour in a vehicle.  If local hospice available, we can transfer him.  Otherwise we should provide him end-of-life care in the hospital.   DVT prophylaxis:   Comfort care   Code Status: Comfort care Family Communication: Granddaughter at the bedside. Disposition Plan: Status is: Inpatient  Remains inpatient appropriate because:Unsafe d/c plan  Dispo: The patient is from: Home              Anticipated d/c is to:  Inpatient hospice              Patient currently is not medically stable to d/c.   Difficult to place patient No         Consultants:  Neurology Palliative and hospice  Procedures:  None  Antimicrobials:  None   Subjective: Seen patient in the morning rounds.  Granddaughter  at the bedside.  Used 1 dose of morphine last night.  Looks very comfortable and sleepy.  Unable to communicate.  Objective: Vitals:   01/12/21 1300 01/12/21 1640 01/12/21 1946 01/13/21 0500  BP: (!) 110/58 120/63 (!) 103/53 (!) 102/55  Pulse: 71 84 86 72  Resp: (!) 24 (!) 22 20 18   Temp:   98.8 F (37.1 C) 98.3 F (36.8 C)  TempSrc:   Axillary Axillary  SpO2: 90% 91% 91% (!) 88%  Weight:   69.9 kg   Height:   5\' 10"  (1.778 m)    No intake or output data in the 24 hours ending 01/13/21 0827  Filed Weights   01/12/21 1946  Weight: 69.9 kg    Examination:  Unresponsive.  Does not respond to any stimuli. Looks very comfortable and sleepy.  On room air.  Family at bedside.   Data Reviewed: I have personally reviewed following labs and imaging studies  CBC: Recent Labs  Lab 01/07/21 0626 01/06/2021 1122  WBC 7.5 7.8  NEUTROABS 5.7 6.4  HGB 11.3* 12.7*  HCT 34.4* 38.6*  MCV 93.2 95.8  PLT 171 938   Basic Metabolic Panel: Recent Labs  Lab 01/07/21 0626 12/20/2020 1122  NA 136 134*  K 4.0 4.1  CL 98 98  CO2 27 22  GLUCOSE 122* 137*  BUN 30* 29*  CREATININE 1.55* 1.77*  CALCIUM 9.5 9.2   GFR: Estimated Creatinine Clearance: 23 mL/min (A) (by C-G formula based on SCr of 1.77 mg/dL (H)). Liver Function Tests: Recent  Labs  Lab 01/07/21 0626 01/16/2021 1122  AST 14* 37  ALT 6 13  ALKPHOS 69 67  BILITOT 1.9* 1.9*  PROT 7.5 7.5  ALBUMIN 4.1 3.8   No results for input(s): LIPASE, AMYLASE in the last 168 hours. No results for input(s): AMMONIA in the last 168 hours. Coagulation Profile: Recent Labs  Lab 01/12/2021 1122  INR 1.1   Cardiac Enzymes: Recent Labs  Lab 01/17/2021 1122  CKTOTAL 936*   BNP (last 3 results) No results for input(s): PROBNP in the last 8760 hours. HbA1C: No results for input(s): HGBA1C in the last 72 hours. CBG: Recent Labs  Lab 01/17/2021 1109  GLUCAP 134*   Lipid Profile: No results for input(s): CHOL, HDL, LDLCALC, TRIG,  CHOLHDL, LDLDIRECT in the last 72 hours. Thyroid Function Tests: No results for input(s): TSH, T4TOTAL, FREET4, T3FREE, THYROIDAB in the last 72 hours. Anemia Panel: No results for input(s): VITAMINB12, FOLATE, FERRITIN, TIBC, IRON, RETICCTPCT in the last 72 hours. Sepsis Labs: Recent Labs  Lab 12/23/2020 1122 01/10/2021 1340  LATICACIDVEN 2.9* 2.1*    Recent Results (from the past 240 hour(s))  Resp Panel by RT-PCR (Flu A&B, Covid) Nasopharyngeal Swab     Status: None   Collection Time: 01/07/21 10:59 AM   Specimen: Nasopharyngeal Swab; Nasopharyngeal(NP) swabs in vial transport medium  Result Value Ref Range Status   SARS Coronavirus 2 by RT PCR NEGATIVE NEGATIVE Final    Comment: (NOTE) SARS-CoV-2 target nucleic acids are NOT DETECTED.  The SARS-CoV-2 RNA is generally detectable in upper respiratory specimens during the acute phase of infection. The lowest concentration of SARS-CoV-2 viral copies this assay can detect is 138 copies/mL. A negative result does not preclude SARS-Cov-2 infection and should not be used as the sole basis for treatment or other patient management decisions. A negative result may occur with  improper specimen collection/handling, submission of specimen other than nasopharyngeal swab, presence of viral mutation(s) within the areas targeted by this assay, and inadequate number of viral copies(<138 copies/mL). A negative result must be combined with clinical observations, patient history, and epidemiological information. The expected result is Negative.  Fact Sheet for Patients:  EntrepreneurPulse.com.au  Fact Sheet for Healthcare Providers:  IncredibleEmployment.be  This test is no t yet approved or cleared by the [place] FDA and  has been authorized for detection and/or diagnosis of SARS-CoV-2 by FDA under an Emergency Use Authorization (EUA). This EUA will remain  in effect (meaning this test can be used)  for the duration of the COVID-19 declaration under Section 564(b)(1) of the Act, 21 U.S.C.section 360bbb-3(b)(1), unless the authorization is terminated  or revoked sooner.       Influenza A by PCR NEGATIVE NEGATIVE Final   Influenza B by PCR NEGATIVE NEGATIVE Final    Comment: (NOTE) The Xpert Xpress SARS-CoV-2/FLU/RSV plus assay is intended as an aid in the diagnosis of influenza from Nasopharyngeal swab specimens and should not be used as a sole basis for treatment. Nasal washings and aspirates are unacceptable for Xpert Xpress SARS-CoV-2/FLU/RSV testing.  Fact Sheet for Patients: EntrepreneurPulse.com.au  Fact Sheet for Healthcare Providers: IncredibleEmployment.be  This test is not yet approved or cleared by the [place] FDA and has been authorized for detection and/or diagnosis of SARS-CoV-2 by FDA under an Emergency Use Authorization (EUA). This EUA will remain in effect (meaning this test can be used) for the duration of the COVID-19 declaration under Section 564(b)(1) of the Act, 21 U.S.C. section 360bbb-3(b)(1), unless the authorization is  terminated or revoked.  Performed at [facility], [address], [place], [place] [address]   Resp Panel by RT-PCR (Flu A&B, Covid) Nasopharyngeal Swab     Status: None   Collection Time: 12/30/2020 10:54 AM   Specimen: Nasopharyngeal Swab; Nasopharyngeal(NP) swabs in vial transport medium  Result Value Ref Range Status   SARS Coronavirus 2 by RT PCR NEGATIVE NEGATIVE Final    Comment: (NOTE) SARS-CoV-2 target nucleic acids are NOT DETECTED.  The SARS-CoV-2 RNA is generally detectable in upper respiratory specimens during the acute phase of infection. The lowest concentration of SARS-CoV-2 viral copies this assay can detect is 138 copies/mL. A negative result does not preclude SARS-Cov-2 infection and should not be used as the sole basis for treatment or other  patient management decisions. A negative result may occur with  improper specimen collection/handling, submission of specimen other than nasopharyngeal swab, presence of viral mutation(s) within the areas targeted by this assay, and inadequate number of viral copies(<138 copies/mL). A negative result must be combined with clinical observations, patient history, and epidemiological information. The expected result is Negative.  Fact Sheet for Patients:  EntrepreneurPulse.com.au  Fact Sheet for Healthcare Providers:  IncredibleEmployment.be  This test is no t yet approved or cleared by the [place] FDA and  has been authorized for detection and/or diagnosis of SARS-CoV-2 by FDA under an Emergency Use Authorization (EUA). This EUA will remain  in effect (meaning this test can be used) for the duration of the COVID-19 declaration under Section 564(b)(1) of the Act, 21 U.S.C.section 360bbb-3(b)(1), unless the authorization is terminated  or revoked sooner.       Influenza A by PCR NEGATIVE NEGATIVE Final   Influenza B by PCR NEGATIVE NEGATIVE Final    Comment: (NOTE) The Xpert Xpress SARS-CoV-2/FLU/RSV plus assay is intended as an aid in the diagnosis of influenza from Nasopharyngeal swab specimens and should not be used as a sole basis for treatment. Nasal washings and aspirates are unacceptable for Xpert Xpress SARS-CoV-2/FLU/RSV testing.  Fact Sheet for Patients: EntrepreneurPulse.com.au  Fact Sheet for Healthcare Providers: IncredibleEmployment.be  This test is not yet approved or cleared by the [place] FDA and has been authorized for detection and/or diagnosis of SARS-CoV-2 by FDA under an Emergency Use Authorization (EUA). This EUA will remain in effect (meaning this test can be used) for the duration of the COVID-19 declaration under Section 564(b)(1) of the Act, 21 U.S.C. section  360bbb-3(b)(1), unless the authorization is terminated or revoked.  Performed at [facility], [place] [address], [place], [place] [address]          Radiology Studies: DG Pelvis 1-2 Views  Result Date: 01/13/2021 CLINICAL DATA:  fall EXAM: PELVIS - 1-2 VIEW COMPARISON:  April 27, 2015 FINDINGS: Osteopenia. No definitive displaced fracture visualized. Moderate degenerative changes with rim osteophyte formation of bilateral hips limits evaluation for subtle fractures. Degenerative changes of the lower lumbar spine. Pelvic phleboliths. IMPRESSION: No acute displaced fracture visualized on this single AP view. If persistent concern for nondisplaced fractures, recommend dedicated additional views and/or cross-sectional imaging. Electronically Signed   By: Valentino Saxon M.D.   On: 01/12/2021 11:31   CT Head Wo Contrast  Result Date: 01/10/2021 CLINICAL DATA:  Patient fell while changing a light bulb. Patient hit the back of his head. EXAM: CT HEAD WITHOUT CONTRAST CT CERVICAL SPINE WITHOUT CONTRAST TECHNIQUE: Multidetector CT imaging of the head and cervical spine was performed following the standard protocol without intravenous contrast.  Multiplanar CT image reconstructions of the cervical spine were also generated. COMPARISON:  04/27/2015 FINDINGS: CT HEAD FINDINGS Brain: Multiple patchy areas hypoattenuation are noted involving a significant portion of the right MCA and ACA territories, with multiple areas of loss of the normal gray-white junction. Hypoattenuation also involves the right insular cortex. There is mass effect with right-sided sulcal effacement and slight midline shift to the left of 2-3 mm. These findings are new from the prior CT. Ventricles are normal in size and configuration. No hydrocephalus. There is mild effacement of the right lateral ventricle. No parenchymal masses. Patchy areas of white matter hypoattenuation on the left are consistent with mild chronic  microvascular ischemic change. There are no extra-axial masses or abnormal fluid collections. No intracranial hemorrhage. Vascular: No hyperdense vessel or unexpected calcification. Skull: Normal. Negative for fracture or focal lesion. Sinuses/Orbits: Globes and orbits are unremarkable. Previous left mastoidectomy. Clear right mastoid air cells. Mild ethmoid sinus mucosal thickening. Other: None. CT CERVICAL SPINE FINDINGS Alignment: Normal. Skull base and vertebrae: No acute fracture. No primary bone lesion or focal pathologic process. Soft tissues and spinal canal: No prevertebral fluid or swelling. No visible canal hematoma. Disc levels: Marked loss of disc height at C3-C4. Mild loss of disc height at C6-C7. Mild facet degenerative changes on the right most notably at C5-C6. No convincing disc herniation. Upper chest: No acute findings. Other: Dense carotid atherosclerotic calcifications at the bifurcations and proximal internal carotid arteries. IMPRESSION: HEAD CT 1. Multiple areas of hypoattenuation along the right cerebral hemisphere, involving the gray-white junction, and involving a significant portion of the right MCA and ACA vascular territories. Findings are consistent with recent infarction. 2. No intracranial hemorrhage. No findings consistent with intracranial trauma. CERVICAL CT 1. No fracture or acute finding. Critical Value/emergent results were called by telephone at the time of interpretation on 01/14/2021 at 12:12 pm to provider [facility] , who verbally acknowledged these results. Electronically Signed   By: Lajean Manes M.D.   On: 12/22/2020 12:09   CT Cervical Spine Wo Contrast  Result Date: 01/07/2021 CLINICAL DATA:  Patient fell while changing a light bulb. Patient hit the back of his head. EXAM: CT HEAD WITHOUT CONTRAST CT CERVICAL SPINE WITHOUT CONTRAST TECHNIQUE: Multidetector CT imaging of the head and cervical spine was performed following the standard protocol without  intravenous contrast. Multiplanar CT image reconstructions of the cervical spine were also generated. COMPARISON:  04/27/2015 FINDINGS: CT HEAD FINDINGS Brain: Multiple patchy areas hypoattenuation are noted involving a significant portion of the right MCA and ACA territories, with multiple areas of loss of the normal gray-white junction. Hypoattenuation also involves the right insular cortex. There is mass effect with right-sided sulcal effacement and slight midline shift to the left of 2-3 mm. These findings are new from the prior CT. Ventricles are normal in size and configuration. No hydrocephalus. There is mild effacement of the right lateral ventricle. No parenchymal masses. Patchy areas of white matter hypoattenuation on the left are consistent with mild chronic microvascular ischemic change. There are no extra-axial masses or abnormal fluid collections. No intracranial hemorrhage. Vascular: No hyperdense vessel or unexpected calcification. Skull: Normal. Negative for fracture or focal lesion. Sinuses/Orbits: Globes and orbits are unremarkable. Previous left mastoidectomy. Clear right mastoid air cells. Mild ethmoid sinus mucosal thickening. Other: None. CT CERVICAL SPINE FINDINGS Alignment: Normal. Skull base and vertebrae: No acute fracture. No primary bone lesion or focal pathologic process. Soft tissues and spinal canal: No prevertebral fluid or swelling. No  visible canal hematoma. Disc levels: Marked loss of disc height at C3-C4. Mild loss of disc height at C6-C7. Mild facet degenerative changes on the right most notably at C5-C6. No convincing disc herniation. Upper chest: No acute findings. Other: Dense carotid atherosclerotic calcifications at the bifurcations and proximal internal carotid arteries. IMPRESSION: HEAD CT 1. Multiple areas of hypoattenuation along the right cerebral hemisphere, involving the gray-white junction, and involving a significant portion of the right MCA and ACA vascular  territories. Findings are consistent with recent infarction. 2. No intracranial hemorrhage. No findings consistent with intracranial trauma. CERVICAL CT 1. No fracture or acute finding. Critical Value/emergent results were called by telephone at the time of interpretation on 01/13/2021 at 12:12 pm to provider [facility] , who verbally acknowledged these results. Electronically Signed   By: Lajean Manes M.D.   On: 01/01/2021 12:09   DG Chest Port 1 View  Result Date: 01/16/2021 CLINICAL DATA:  AMS, presumed fall EXAM: PORTABLE CHEST 1 VIEW COMPARISON:  December 20, 2020 FINDINGS: The cardiomediastinal silhouette is unchanged and enlarged in contour.Atherosclerotic calcifications of the aorta. No large pleural effusion; incomplete assessment of the RIGHT costophrenic angle. No pneumothorax. New bandlike opacity of the RIGHT mid lung. Visualized abdomen is unremarkable. Multilevel degenerative changes of the thoracic spine. IMPRESSION: New band like opacity of the RIGHT mid lung, likely atelectasis. Superimposed infection could present similarly. Electronically Signed   By: Valentino Saxon M.D.   On: 01/07/2021 11:28        Scheduled Meds:  antiseptic oral rinse  15 mL Topical BID   glycopyrrolate  0.2 mg Intravenous Q4H   LORazepam  1 mg Intravenous Once   Continuous Infusions:   LOS: 2 days    Time spent: 30 minutes    Barb Merino, MD [facility] Pager [phone]

## 2021-01-14 DIAGNOSIS — I639 Cerebral infarction, unspecified: Secondary | ICD-10-CM | POA: Diagnosis not present

## 2021-01-14 DIAGNOSIS — Z515 Encounter for palliative care: Secondary | ICD-10-CM | POA: Diagnosis not present

## 2021-01-14 NOTE — Progress Notes (Signed)
This chaplain responded to the Pt. family's invitation for F/U spiritual care.  The chaplain continued to establish a rapport with Marzetta Board and was introduced to Stacey's sister and children.   The chaplain created space for the family to participate in story telling, highlighting the Pt. legacy and shared with grace. The family accepted the chaplain's invitation for prayer.   The chaplain offered F/U spiritual care as needed.

## 2021-01-14 NOTE — Progress Notes (Signed)
Daily Progress Note   Patient Name: Ralph Mendoza       Date: 01/14/2021 DOB: 1921/04/06  Age: 87 y.o. MRN#: [id number] Attending Physician: Richarda Osmond, MD Primary Care Physician: [name], MD Admit Date: 12/22/2020  Reason for Consultation/Follow-up: end of life care, symptom management  Subjective:  Patient appears comfortable. Unresponsive to voice and light touch. No non-verbal signs of pain or discomfort noted. Respirations are even and unlabored. No excessive respiratory secretions noted.   Family present at bedside. Education and counseling provided on expectations at EOL. Also discussed non-verbal signs of pain and agitation and provided education on indications for morphine and ativan.  Emotional support provided.      Length of Stay: 3  Current Medications: Scheduled Meds:   antiseptic oral rinse  15 mL Topical BID   glycopyrrolate  0.2 mg Intravenous Q8H   LORazepam  1 mg Intravenous Once     PRN Meds: acetaminophen **OR** acetaminophen, glycopyrrolate **OR** glycopyrrolate **OR** glycopyrrolate, haloperidol **OR** haloperidol **OR** haloperidol lactate, LORazepam **OR** LORazepam **OR** LORazepam, morphine injection, ondansetron **OR** ondansetron (ZOFRAN) IV, polyvinyl alcohol         Vital Signs: BP (!) 107/38 (BP Location: Left Arm)   Pulse 73   Temp 97.8 F (36.6 C) (Oral)   Resp 17   Ht 5\' 10"  (1.778 m)   Wt 69.9 kg   SpO2 93%   BMI 22.11 kg/m  SpO2: SpO2: 93 % O2 Device: O2 Device: Room Air O2 Flow Rate:    Intake/output summary:  Intake/Output Summary (Last 24 hours) at 01/14/2021 1215 Last data filed at 01/13/2021 1545 Gross per 24 hour  Intake 0 ml  Output --  Net 0 ml   LBM: Last BM Date: 01/07/21 Baseline Weight: Weight: 69.9  kg Most recent weight: Weight: 69.9 kg       Palliative Assessment/Data: PPS 10%     Palliative Care Assessment & Plan   Patient Profile: 87 y.o. male  with past medical history of  chronic A. 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 CT showed multiple strokes that Neurology felt would be  disabling - patient would no longer be able to live independently. Patient was admitted on 01/10/2021 with right sided MCA, ACA embolic stroke.    ED Course: No tachycardia or hypotension, EKG showed chronic A. fib.  CT head showed multiple embolic infarcts left MCA and ACA territories.  Blood work WBC 10.8, hemoglobin 12.7, sodium 134, BUN 29, creatinine 1.7   Assessment: Right sided MCA and ACA embolic stroke Right maxillary B-cell lymphoma s/p palliative radiation CKD stage IIIb Terminal respiratory secretions Terminal care  Recommendations/Plan: Continue full comfort measures PRN medications are available for symptom management at EOL Family has declined residential hospice - anticipate hospital death  Goals of Care and Additional Recommendations: Limitations on Scope of Treatment: Full Comfort Care  Code Status: DNR/DNI   Prognosis:  Hours - Days   Thank you for allowing the Palliative Medicine Team to assist in the care of this patient.   Total Time 15 minutes Prolonged Time Billed  no       Greater than 50%  of this time was spent counseling and coordinating care related to the above assessment and plan.  Lavena Bullion, NP  Please contact Palliative Medicine Team phone at [phone] for questions and concerns.

## 2021-01-14 NOTE — Care Management Important Message (Signed)
Important Message  Patient Details  Name: Ralph Mendoza MRN: [id number] Date of Birth: 1920-09-19   Medicare Important Message Given:  Yes  Patient is at end of life out of respect no IM provided will mail to the patient home address.   Larae Caison 01/14/2021, 1:41 PM

## 2021-01-14 NOTE — Progress Notes (Signed)
PROGRESS NOTE    Ralph Mendoza  TMH:[id number] DOB: 03-26-1921 DOA: 01/08/2021 PCP: [name], MD    Brief Narrative:  87 year old gentleman with history of chronic A. 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  In the emergency room he was hemodynamically stable.  EKG showed chronic A. fib.  CT head showed multiple embolic infarcts left MCA and ACA territories.  Seen by neurology.  With his depressed mentation, palliation and hospice recommended.  Assessment & Plan:   Principal Problem:   Admission for end of life care Active Problems:   CVA (cerebral vascular accident) [facility])  End-of-life care Multiple areas of embolic stroke Failure to thrive Acute metabolic encephalopathy due to stroke. Ambulatory dysfunction and multiple medical issues.  Plan: Full comfort measures DNR/DNI All symptom control medications available Unrestricted visitor policy Palliative and hospice to follow in the hospital.  Family members prefer to keep inpatient.  DVT prophylaxis:   Comfort care   Code Status: Comfort care Family Communication: Granddaughter at the bedside. Disposition Plan: Status is: Inpatient  Remains inpatient appropriate because:Unsafe d/c plan  Dispo: The patient is from: Home              Anticipated d/c is to:  Inpatient hospice              Patient currently is not medically stable to d/c.   Difficult to place patient No  Consultants:  Neurology Palliative and hospice  Procedures:  None  Antimicrobials:  None   Subjective: Patient unresponsive this morning but appears comfortable sleeping in bed.  Granddaughter at bedside has general questions about amount of time that he has left.  Discussed unpredictability but likely in the next couple of days.  Objective: Vitals:   01/12/21  1640 01/12/21 1946 01/13/21 0500 01/14/21 0557  BP: 120/63 (!) 103/53 (!) 102/55 (!) 107/38  Pulse: 84 86 72 73  Resp: (!) 22 20 18 17   Temp:  98.8 F (37.1 C) 98.3 F (36.8 C) 97.8 F (36.6 C)  TempSrc:  Axillary Axillary Oral  SpO2: 91% 91% (!) 88% 93%  Weight:  69.9 kg    Height:  5\' 10"  (1.778 m)      Intake/Output Summary (Last 24 hours) at 01/14/2021 1349 Last data filed at 01/14/2021 0800 Gross per 24 hour  Intake 0 ml  Output --  Net 0 ml    Filed Weights   01/12/21 1946  Weight: 69.9 kg    Examination:  Unresponsive.  Does not respond to any stimuli. Looks very comfortable and sleepy.  On room air.  Family at bedside.   Data Reviewed: I have personally reviewed following labs and imaging studies  CBC: Recent Labs  Lab 01/05/2021 1122  WBC 7.8  NEUTROABS 6.4  HGB 12.7*  HCT 38.6*  MCV 95.8  PLT 921    Basic Metabolic Panel: Recent Labs  Lab 01/16/2021 1122  NA 134*  K 4.1  CL 98  CO2 22  GLUCOSE 137*  BUN 29*  CREATININE 1.77*  CALCIUM 9.2    GFR: Estimated Creatinine Clearance: 23 mL/min (A) (by C-G formula based on SCr of 1.77 mg/dL (H)). Liver Function Tests: Recent Labs  Lab 01/15/2021 1122  AST 37  ALT 13  ALKPHOS 67  BILITOT 1.9*  PROT 7.5  ALBUMIN 3.8    Coagulation Profile: Recent Labs  Lab 01/08/2021  1122  INR 1.1    Cardiac Enzymes: Recent Labs  Lab 12/30/2020 1122  CKTOTAL 936*    CBG: Recent Labs  Lab 01/06/2021 1109  GLUCAP 134*    Recent Labs  Lab 12/27/2020 1122 01/05/2021 1340  LATICACIDVEN 2.9* 2.1*    Radiology Studies: No results found.   Scheduled Meds:  antiseptic oral rinse  15 mL Topical BID   glycopyrrolate  0.2 mg Intravenous Q8H   LORazepam  1 mg Intravenous Once     LOS: 3 days    Time spent: 30 minutes    Richarda Osmond, MD [facility] Pager [phone]

## 2021-01-15 DIAGNOSIS — Z515 Encounter for palliative care: Secondary | ICD-10-CM | POA: Diagnosis not present

## 2021-01-15 DIAGNOSIS — I639 Cerebral infarction, unspecified: Secondary | ICD-10-CM | POA: Diagnosis not present

## 2021-01-15 MED ORDER — MORPHINE BOLUS VIA INFUSION
1.0000 mg | INTRAVENOUS | Status: DC | PRN
  Administered 2021-01-15: 1 mg via INTRAVENOUS
  Filled 2021-01-15: qty 2

## 2021-01-15 MED ORDER — MORPHINE 100MG IN NS 100ML (1MG/ML) PREMIX INFUSION
1.0000 mg/h | INTRAVENOUS | Status: DC
  Administered 2021-01-15: 1 mg/h via INTRAVENOUS
  Filled 2021-01-15: qty 100

## 2021-01-15 MED ORDER — SODIUM CHLORIDE 0.9 % IV SOLN
INTRAVENOUS | Status: DC | PRN
  Administered 2021-01-15: 250 mL via INTRAVENOUS

## 2021-01-15 MED ORDER — MORPHINE SULFATE (PF) 2 MG/ML IV SOLN
2.0000 mg | INTRAVENOUS | Status: DC
Start: 2021-01-15 — End: 2021-01-15
  Administered 2021-01-15: 2 mg via INTRAVENOUS
  Filled 2021-01-15: qty 1

## 2021-01-15 NOTE — Progress Notes (Signed)
Daily Progress Note   Patient Name: Ralph Mendoza       Date: 01/15/2021 DOB: 1920/11/23  Age: 87 y.o. MRN#: [id number] Attending Physician: Richarda Osmond, MD Primary Care Physician: [name], MD Admit Date: 12/25/2020  Reason for Consultation/Follow-up: end of life care, symptom management  Subjective: Per MAR review, patient received 3 doses of morphine overnight. Family had expressed to attending MD their interest for scheduled pain medication.   I went to bedside to see patient. He appears comfortable. Unresponsive to voice and light touch. No non-verbal signs of pain or discomfort noted. Respirations are even and unlabored. No excessive respiratory secretions noted.   Family present at bedside. I dicussed with them starting a morphine infusion, which would allow more flexibility with dosing. Family is agreeable to starting a morphine infusion. Discussed that he will still need prn doses of ativan, which works as an adjunct for treatment of discomfort at EOL.  Education and counseling provided on expectations at EOL. Discussed that vital signs were "stable" and prognosis was likely days. Emotional support provided.     Length of Stay: 4  Current Medications: Scheduled Meds:   antiseptic oral rinse  15 mL Topical BID   glycopyrrolate  0.2 mg Intravenous Q8H   LORazepam  1 mg Intravenous Once    morphine injection  2 mg Intravenous Q2H    Continuous Infusions:  morphine      PRN Meds: acetaminophen **OR** acetaminophen, glycopyrrolate **OR** glycopyrrolate **OR** glycopyrrolate, haloperidol **OR** haloperidol **OR** haloperidol lactate, LORazepam **OR** LORazepam **OR** LORazepam, morphine, ondansetron **OR** ondansetron (ZOFRAN) IV, polyvinyl alcohol        Vital  Signs: BP 130/67 (BP Location: Left Arm)   Pulse 68   Temp 98.4 F (36.9 C) (Oral)   Resp 17   Ht 5\' 10"  (1.778 m)   Wt 69.9 kg   SpO2 96%   BMI 22.11 kg/m  SpO2: SpO2: 96 % O2 Device: O2 Device: Room Air O2 Flow Rate:    Intake/output summary:  Intake/Output Summary (Last 24 hours) at 01/15/2021 1120 Last data filed at 01/15/2021 0839 Gross per 24 hour  Intake 0 ml  Output --  Net 0 ml   LBM: Last BM Date: 01/07/21 Baseline Weight: Weight: 69.9 kg Most recent weight: Weight: 69.9 kg  Palliative Assessment/Data: PPS 10%       Palliative Care Assessment & Plan   Patient Profile: 87 y.o. male  with past medical history of  chronic A. 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 CT showed multiple strokes that Neurology felt would be disabling - patient would no longer be able to live independently. Patient was admitted on 12/29/2020 with right sided MCA, ACA embolic stroke.    ED Course: No tachycardia or hypotension, EKG showed chronic A. fib.  CT head showed multiple embolic infarcts left MCA and ACA territories.  Blood work WBC 10.8, hemoglobin 12.7, sodium 134, BUN 29, creatinine 1.7   Assessment: Right sided MCA and ACA embolic stroke Right maxillary B-cell lymphoma s/p palliative radiation CKD stage IIIb Terminal respiratory secretions Terminal care  Recommendations/Plan: Continue full comfort care Start morphine infusion, will need to use a carrier fluid if at a low rate Administer bolus dose via the infusion prior to any rate increase PRN medications are available for symptom management at EOL PMT will continue to follow  Goals of Care and Additional Recommendations: Limitations on Scope of Treatment: Full Comfort Care  Code Status:  DNR/DNI  Prognosis:  Hours - Days  Discharge Planning: Anticipated Hospital Death  Care plan was discussed with Dr. Ouida Sills and bedside RN  Thank you for allowing the Palliative Medicine Team to assist in the care of this patient.   Total Time 25 minutes Prolonged Time Billed  no       Greater than 50%  of this time was spent counseling and coordinating care related to the above assessment and plan.  Lavena Bullion, NP  Please contact Palliative Medicine Team phone at [phone] for questions and concerns.

## 2021-01-15 NOTE — Progress Notes (Signed)
Family member said the family has decided to just do the morphine for pain, no ativan or other meds.

## 2021-01-15 NOTE — Progress Notes (Signed)
Pt resting comfortably throughout afternoon and at shift change.  Morphine drip at 1 mg/hr. Gave 1 1mg  bolus before cleaning him up (condom cath leaked).  Carrier fluid NS at 5 mL/hr.  Gave a total of 2 doses of ativan for slight jerking of extremities.  When running drip and carrier fluids, lungs started to sound a little crackly. Administered robinul and lungs cleared within an hour.

## 2021-01-15 NOTE — Progress Notes (Signed)
PROGRESS NOTE    Ralph Mendoza  TZG:[id number] DOB: [birth year] DOA: 01/09/2021 PCP: [name], MD    Brief Narrative:  87 year old gentleman with history of chronic A. 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  In the emergency room he was hemodynamically stable.  EKG showed chronic A. fib.  CT head showed multiple embolic infarcts left MCA and ACA territories.  Seen by neurology.  With his depressed mentation, palliation and hospice recommended.  Assessment & Plan:   Principal Problem:   Admission for end of life care Active Problems:   CVA (cerebral vascular accident) [facility])  End-of-life care Multiple areas of embolic stroke Failure to thrive Acute metabolic encephalopathy due to stroke. Ambulatory dysfunction and multiple medical issues.  Plan: Full comfort measures DNR/DNI All symptom control medications available Unrestricted visitor policy Palliative and hospice to follow in the hospital.  Family members prefer to keep inpatient. Changed to continuous morphine drip today for perceived pain by family members in between the PRN doses.   DVT prophylaxis:   Comfort care   Code Status: Comfort care Family Communication: Granddaughter at the bedside. Disposition Plan: Status is: Inpatient  Remains inpatient appropriate because:Unsafe d/c plan  Dispo: The patient is from: Home              Anticipated d/c is to:  Inpatient hospice              Patient currently is not medically stable to d/c.   Difficult to place patient No  Consultants:  Neurology Palliative and hospice  Procedures:  None  Antimicrobials:  None   Subjective: Patient unresponsive this morning but appears comfortable sleeping in bed.  Granddaughter at bedside discusses that throughout the afternoon/night he had  episodes of grimacing which she interpreted as pain. She spoke to the family and they are in agreement that they would like to schedule pain medications  Objective: Vitals:   01/12/21 1946 01/13/21 0500 01/14/21 0557 01/15/21 0520  BP: (!) 103/53 (!) 102/55 (!) 107/38 130/67  Pulse: 86 72 73 68  Resp: 20 18 17    Temp: 98.8 F (37.1 C) 98.3 F (36.8 C) 97.8 F (36.6 C) 98.4 F (36.9 C)  TempSrc: Axillary Axillary Oral Oral  SpO2: 91% (!) 88% 93% 96%  Weight: 69.9 kg     Height: 5\' 10"  (1.778 m)       Intake/Output Summary (Last 24 hours) at 01/15/2021 0903 Last data filed at 01/15/2021 0839 Gross per 24 hour  Intake 0 ml  Output --  Net 0 ml     Filed Weights   01/12/21 1946  Weight: 69.9 kg    Examination:  Unresponsive.  Does not respond to any stimuli. Looks very comfortable and sleepy.  On room air.  Family at bedside.   Data Reviewed: I have personally reviewed following labs and imaging studies  CBC: Recent Labs  Lab 01/03/2021 1122  WBC 7.8  NEUTROABS 6.4  HGB 12.7*  HCT 38.6*  MCV 95.8  PLT 759    Basic Metabolic Panel: Recent Labs  Lab 01/10/2021 1122  NA 134*  K 4.1  CL 98  CO2 22  GLUCOSE 137*  BUN 29*  CREATININE 1.77*  CALCIUM 9.2    GFR: Estimated Creatinine Clearance: 23 mL/min (A) (by C-G formula based on SCr of 1.77 mg/dL (H)). Liver Function Tests: Recent Labs  Lab 01/10/2021 1122  AST 37  ALT 13  ALKPHOS 67  BILITOT 1.9*  PROT 7.5  ALBUMIN 3.8    Coagulation Profile: Recent Labs  Lab 01/12/2021 1122  INR 1.1    Cardiac Enzymes: Recent Labs  Lab 01/02/2021 1122  CKTOTAL 936*    CBG: Recent Labs  Lab 12/30/2020 1109  GLUCAP 134*    Recent Labs  Lab 12/27/2020 1122 12/21/2020 1340  LATICACIDVEN 2.9* 2.1*    Radiology Studies: No results found.   Scheduled Meds:  antiseptic oral rinse  15 mL Topical BID   glycopyrrolate  0.2 mg Intravenous Q8H   LORazepam  1 mg Intravenous Once     LOS: 4 days     Time spent: 30 minutes    Richarda Osmond, MD [facility] Pager [phone]

## 2021-01-16 DIAGNOSIS — Z515 Encounter for palliative care: Secondary | ICD-10-CM | POA: Diagnosis not present

## 2021-01-16 DIAGNOSIS — I639 Cerebral infarction, unspecified: Secondary | ICD-10-CM | POA: Diagnosis not present

## 2021-01-16 NOTE — Progress Notes (Addendum)
Daily Progress Note   Patient Name: Ralph Mendoza       Date: 01/16/2021 DOB: 03-04-1921  Age: 87 y.o. MRN#: [id number] Attending Physician: Richarda Osmond, MD Primary Care Physician: [name], MD Admit Date: 12/26/2020  Reason for Consultation/Follow-up: symptom management, end of life care  Subjective: Patient appears comfortable. Morphine infusion is at 1 mg/hr. Unresponsive to voice and light touch. No non-verbal signs of pain or discomfort noted. Respirations are even and unlabored. No excessive respiratory secretions noted.   Granddaughter Erline Levine present at bedside. Discussion had about natural trajectory at EOL. Education provided that vital signs this morning remain stable and his extremities are warm. Prognosis likely days at this point. Emotional support provided.     Length of Stay: 5  Current Medications: Scheduled Meds:   antiseptic oral rinse  15 mL Topical BID   glycopyrrolate  0.2 mg Intravenous Q8H   LORazepam  1 mg Intravenous Once    Continuous Infusions:  sodium chloride 250 mL (01/15/21 1244)   morphine 1 mg/hr (01/15/21 1245)    PRN Meds: sodium chloride, acetaminophen **OR** acetaminophen, glycopyrrolate **OR** glycopyrrolate **OR** glycopyrrolate, haloperidol **OR** haloperidol **OR** haloperidol lactate, LORazepam **OR** LORazepam **OR** LORazepam, morphine, ondansetron **OR** ondansetron (ZOFRAN) IV, polyvinyl alcohol        Vital Signs: BP 122/63 (BP Location: Left Arm)   Pulse 88   Temp 98.7 F (37.1 C) (Oral)   Resp 17   Ht 5\' 10"  (2.641 m)   Wt 69.9 kg   SpO2 90%   BMI 22.11 kg/m  SpO2: SpO2: 90 % O2 Device: O2 Device: Room Air O2 Flow Rate:     LBM: Last BM Date: 01/07/21 Baseline Weight: Weight: 69.9 kg Most recent weight:  Weight: 69.9 kg       Palliative Assessment/Data: PPS 10%      Palliative Care Assessment & Plan   Patient Profile: 87 y.o. male  with past medical history of  chronic A. 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 CT showed multiple strokes that Neurology felt would be disabling - patient would no longer be able to  live independently. Patient was admitted on 12/27/2020 with right sided MCA, ACA embolic stroke.   Assessment: Right sided MCA and ACA embolic stroke Right maxillary B-cell lymphoma s/p palliative radiation CKD stage IIIb Terminal respiratory secretions Terminal care   Recommendations/Plan: Continue full comfort care Continue morphine infusion Administer bolus dose via the infusion prior to any rate increase PRN medications are available for symptom management at EOL PMT will continue to follow  Goals of Care and Additional Recommendations: Limitations on Scope of Treatment: Full Comfort Care  Code Status: DNR/DNI  Prognosis:  Hours - Days  Discharge Planning: Anticipated Hospital Death  Care plan was discussed with bedside RN  Thank you for allowing the Palliative Medicine Team to assist in the care of this patient.   Total Time 25 minutes Prolonged Time Billed  no       Greater than 50%  of this time was spent counseling and coordinating care related to the above assessment and plan.  Lavena Bullion, NP  Please contact Palliative Medicine Team phone at [phone] for questions and concerns.

## 2021-01-16 NOTE — Progress Notes (Signed)
Pt resting comfortably.  Tips of toes starting to change color to a deep red and are cooler to the touch than this morning.  Lungs had a tiny, faint gurgle in the bases. Robinul cleared it up.  Mouth care second time d/t dryness. Pt tolerated well.  Son is bedside.

## 2021-01-16 NOTE — Progress Notes (Signed)
PROGRESS NOTE    Ralph Mendoza  XBJ:[id number] DOB: [birth year] DOA: 12/30/2020 PCP: [name], MD    Brief Narrative:  87 year old gentleman with history of chronic A. 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  In the emergency room he was hemodynamically stable.  EKG showed chronic A. fib.  CT head showed multiple embolic infarcts left MCA and ACA territories.  Seen by neurology.  With his depressed mentation, palliation and hospice recommended.  Assessment & Plan:   Principal Problem:   Admission for end of life care Active Problems:   CVA (cerebral vascular accident) [facility])  End-of-life care Multiple areas of embolic stroke Failure to thrive Acute metabolic encephalopathy due to stroke. Ambulatory dysfunction and multiple medical issues.  Plan: Full comfort measures DNR/DNI All symptom control medications available Unrestricted visitor policy Palliative and hospice to follow in the hospital.  Family members prefer to keep inpatient. Changed to continuous morphine drip today for perceived pain by family members in between the PRN doses.   DVT prophylaxis:   Comfort care   Code Status: Comfort care Family Communication: Granddaughter at the bedside. Disposition Plan: Status is: Inpatient  Remains inpatient appropriate because:Unsafe d/c plan  Dispo: The patient is from: Home              Anticipated d/c is to:  Inpatient hospice              Patient currently is not medically stable to d/c.   Difficult to place patient No  Consultants:  Neurology Palliative and hospice  Procedures:  None  Antimicrobials:  None   Subjective: Patient unresponsive this morning but appears comfortable sleeping in bed. His mouth is open and dry. Granddaughter at bedside has no questions or concerns  today.  Objective: Vitals:   01/13/21 0500 01/14/21 0557 01/15/21 0520 01/16/21 0504  BP: (!) 102/55 (!) 107/38 130/67 122/63  Pulse: 72 73 68 88  Resp: 18 17    Temp: 98.3 F (36.8 C) 97.8 F (36.6 C) 98.4 F (36.9 C) 98.7 F (37.1 C)  TempSrc: Axillary Oral Oral Oral  SpO2: (!) 88% 93% 96% 90%  Weight:      Height:        Intake/Output Summary (Last 24 hours) at 01/16/2021 1128 Last data filed at 01/16/2021 1042 Gross per 24 hour  Intake 96.75 ml  Output 450 ml  Net -353.25 ml    Filed Weights   01/12/21 1946  Weight: 69.9 kg    Examination:  Unresponsive.  Does not respond to any stimuli. Looks very comfortable and sleepy.  On room air.  Family at bedside. Lungs clear to auscultation.  Dry mucous membranes   Data Reviewed: I have personally reviewed following labs and imaging studies  CBC: Recent Labs  Lab 01/09/2021 1122  WBC 7.8  NEUTROABS 6.4  HGB 12.7*  HCT 38.6*  MCV 95.8  PLT 308   Basic Metabolic Panel: Recent Labs  Lab 01/16/2021 1122  NA 134*  K 4.1  CL 98  CO2 22  GLUCOSE 137*  BUN 29*  CREATININE 1.77*  CALCIUM 9.2   GFR: Estimated Creatinine Clearance: 23 mL/min (A) (by C-G formula based on SCr of 1.77 mg/dL (H)). Liver Function Tests: Recent Labs  Lab 01/16/2021 1122  AST 37  ALT 13  ALKPHOS 67  BILITOT 1.9*  PROT 7.5  ALBUMIN  3.8   Coagulation Profile: Recent Labs  Lab 01/15/2021 1122  INR 1.1   Cardiac Enzymes: Recent Labs  Lab 01/10/2021 1122  CKTOTAL 936*   CBG: Recent Labs  Lab 01/10/2021 1109  GLUCAP 134*   Recent Labs  Lab 01/14/2021 1122 12/24/2020 1340  LATICACIDVEN 2.9* 2.1*   Radiology Studies: No results found.   Scheduled Meds:  antiseptic oral rinse  15 mL Topical BID   glycopyrrolate  0.2 mg Intravenous Q8H   LORazepam  1 mg Intravenous Once  Morphine gtt Zofran prn Eye drops prn Haldol PRN Robinol prn Tylenol prn   LOS: 5 days    Time spent: 30 minutes    Richarda Osmond,  MD [facility] Pager [phone]

## 2021-01-17 DIAGNOSIS — Z515 Encounter for palliative care: Secondary | ICD-10-CM | POA: Diagnosis not present

## 2021-01-17 NOTE — Progress Notes (Signed)
Nutrition Brief Note  Chart reviewed. Pt now transitioning to comfort care.  No further nutrition interventions planned at this time.  Please re-consult as needed.   Gerda Yin W, RD, LDN, CDCES Registered Dietitian II Certified Diabetes Care and Education Specialist Please refer to [facility] for RD and/or RD on-call/weekend/after hours pager   

## 2021-01-17 NOTE — Progress Notes (Signed)
PROGRESS NOTE    Ralph Mendoza  [id number] DOB: [birth year] DOA: 12/26/2020 PCP: [name], MD    Brief Narrative:  87 year old gentleman with history of chronic A. 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  In the emergency room he was hemodynamically stable.  EKG showed chronic A. fib.  CT head showed multiple embolic infarcts left MCA and ACA territories.  Seen by neurology.  With his depressed mentation, palliation and hospice recommended. Comfort care per patient and family wishes.  Assessment & Plan:   Principal Problem:   Admission for end of life care Active Problems:   CVA (cerebral vascular accident) ([place])  Based on his exam today, I would expect to be very close, hours to days of EOL.  Plan: Full comfort measures DNR/DNI All symptom control medications available Unrestricted visitor policy Palliative and hospice to follow in the hospital.  Family members prefer to keep inpatient. continue morphine drip.  DVT prophylaxis:   Comfort care   Code Status: Comfort care Family Communication: Granddaughter at the bedside. Disposition Plan: Status is: Inpatient  Remains inpatient appropriate because:Unsafe d/c plan  Dispo: The patient is from: Home              Anticipated d/c is to:  Inpatient hospice              Patient currently is not medically stable to d/c.   Difficult to place patient No  Consultants:  Neurology Palliative and hospice  Procedures:  None  Antimicrobials:  None   Subjective: Patient unresponsive this morning but appears comfortable sleeping in bed. His mouth is open and dry. No family members at bedside.   Objective: Vitals:   01/14/21 0557 01/15/21 0520 01/16/21 0504 01/17/21 0436  BP: (!) 107/38 130/67 122/63 (!) 80/51  Pulse: 73 68 88 (!) 103   Resp: 17   17  Temp: 97.8 F (36.6 C) 98.4 F (36.9 C) 98.7 F (37.1 C) 98.8 F (37.1 C)  TempSrc: Oral Oral Oral Oral  SpO2: 93% 96% 90% (!) 87%  Weight:      Height:        Intake/Output Summary (Last 24 hours) at 01/17/2021 0819 Last data filed at 01/17/2021 0500 Gross per 24 hour  Intake --  Output 250 ml  Net -250 ml     Filed Weights   01/12/21 1946  Weight: 69.9 kg    Examination:  Unresponsive.  Does not respond to any stimuli. Looks very comfortable and sleepy.  On room air.  Lungs have scattered rhales. His breathing seems to be more irregular in rate and chest fall today.  Dry mucous membranes  Data Reviewed: I have personally reviewed following labs and imaging studies  CBC: Recent Labs  Lab 01/10/2021 1122  WBC 7.8  NEUTROABS 6.4  HGB 12.7*  HCT 38.6*  MCV 95.8  PLT 509    Basic Metabolic Panel: Recent Labs  Lab 12/30/2020 1122  NA 134*  K 4.1  CL 98  CO2 22  GLUCOSE 137*  BUN 29*  CREATININE 1.77*  CALCIUM 9.2    GFR: Estimated Creatinine Clearance: 23 mL/min (A) (by C-G formula based on SCr of 1.77 mg/dL (H)). Liver Function Tests: Recent Labs  Lab 01/15/2021 1122  AST 37  ALT 13  ALKPHOS 67  BILITOT 1.9*  PROT 7.5  ALBUMIN 3.8  Coagulation Profile: Recent Labs  Lab 12/26/2020 1122  INR 1.1    Cardiac Enzymes: Recent Labs  Lab 12/20/2020 1122  CKTOTAL 936*    CBG: Recent Labs  Lab 12/19/2020 1109  GLUCAP 134*    Recent Labs  Lab 01/14/2021 1122 01/13/2021 1340  LATICACIDVEN 2.9* 2.1*    Radiology Studies: No results found.   Scheduled Meds:  antiseptic oral rinse  15 mL Topical BID   glycopyrrolate  0.2 mg Intravenous Q8H   LORazepam  1 mg Intravenous Once  Morphine gtt Zofran prn Eye drops prn Haldol PRN Robinol prn Tylenol prn   LOS: 6 days    Time spent: 30 minutes    Richarda Osmond, MD [facility] Pager [phone]

## 2021-01-18 DEATH — deceased

## 2021-02-18 NOTE — Death Summary Note (Signed)
Death Summary  YAO HYPPOLITE [id number] DOB: 04-May-1920 DOA: 01-23-21  PCP: [name], MD  Admit date: 23-Jan-2021 Date of Death: 01-30-2021  Final Diagnoses:  Principal Problem:   Admission for end of life care Active Problems:   CVA (cerebral vascular accident) [facility])  CVA as a result of cardiac thromboembolism in setting of chronic atrial fibrillation.   History of present illness:  Patient admitted with acute stroke. He and his family expressed wished to pursue comfort care with severe debilitating symptoms. Patient remained comfortable with family at his bedside throughout the last several days of his life.   Time: 0725  Signed:  Richarda Osmond, DO  [facility] 2021-01-30, 8:32 AM

## 2021-02-18 NOTE — Progress Notes (Signed)
0725 Walked into room noted unresponsive , no respiration confirmed by auscultation x2 nurse. Daughter Ethel Rana notified and Dr. Ouida Sills notified [facility] notified spoke with Marya Amsler # [phone]. All belonging given to family.Postmortem care rendered and transported to the morgue.

## 2021-02-18 DEATH — deceased

## 2021-03-27 ENCOUNTER — Other Ambulatory Visit: Payer: Medicare Other

## 2021-03-27 ENCOUNTER — Ambulatory Visit: Payer: Medicare Other | Admitting: Hematology and Oncology
